# Patient Record
Sex: Male | Born: 1950 | Race: White | Hispanic: No | Marital: Married | Smoking: Current every day smoker
Health system: Southern US, Community
[De-identification: ages and names within clinical notes are randomized; demographics above are authoritative.]

## PROBLEM LIST (undated history)

## (undated) DIAGNOSIS — K219 Gastro-esophageal reflux disease without esophagitis: Secondary | ICD-10-CM

## (undated) DIAGNOSIS — I10 Essential (primary) hypertension: Secondary | ICD-10-CM

## (undated) DIAGNOSIS — R911 Solitary pulmonary nodule: Secondary | ICD-10-CM

## (undated) DIAGNOSIS — E785 Hyperlipidemia, unspecified: Secondary | ICD-10-CM

## (undated) DIAGNOSIS — J439 Emphysema, unspecified: Secondary | ICD-10-CM

## (undated) DIAGNOSIS — J449 Chronic obstructive pulmonary disease, unspecified: Secondary | ICD-10-CM

## (undated) DIAGNOSIS — I219 Acute myocardial infarction, unspecified: Secondary | ICD-10-CM

## (undated) DIAGNOSIS — I739 Peripheral vascular disease, unspecified: Secondary | ICD-10-CM

## (undated) DIAGNOSIS — Z87891 Personal history of nicotine dependence: Secondary | ICD-10-CM

## (undated) DIAGNOSIS — E669 Obesity, unspecified: Secondary | ICD-10-CM

## (undated) DIAGNOSIS — I2510 Atherosclerotic heart disease of native coronary artery without angina pectoris: Secondary | ICD-10-CM

## (undated) HISTORY — DX: Peripheral vascular disease, unspecified: I73.9

## (undated) HISTORY — PX: TONSILLECTOMY AND ADENOIDECTOMY: SUR1326

## (undated) HISTORY — DX: Essential (primary) hypertension: I10

## (undated) HISTORY — PX: LAPAROSCOPIC CHOLECYSTECTOMY: SUR755

## (undated) HISTORY — DX: Atherosclerotic heart disease of native coronary artery without angina pectoris: I25.10

## (undated) HISTORY — DX: Hyperlipidemia, unspecified: E78.5

## (undated) HISTORY — PX: HERNIA REPAIR: SHX51

## (undated) HISTORY — DX: Acute myocardial infarction, unspecified: I21.9

## (undated) HISTORY — DX: Gastro-esophageal reflux disease without esophagitis: K21.9

## (undated) HISTORY — PX: OTHER SURGICAL HISTORY: SHX169

---

## 1998-05-06 ENCOUNTER — Ambulatory Visit (HOSPITAL_COMMUNITY): Admission: RE | Admit: 1998-05-06 | Discharge: 1998-05-06 | Payer: Self-pay | Admitting: Urology

## 1999-10-18 ENCOUNTER — Ambulatory Visit (HOSPITAL_COMMUNITY): Admission: RE | Admit: 1999-10-18 | Discharge: 1999-10-18 | Payer: Self-pay | Admitting: Cardiology

## 1999-10-18 ENCOUNTER — Encounter: Payer: Self-pay | Admitting: Cardiology

## 1999-10-24 ENCOUNTER — Encounter: Admission: RE | Admit: 1999-10-24 | Discharge: 1999-10-24 | Payer: Self-pay | Admitting: Cardiology

## 1999-10-24 ENCOUNTER — Encounter: Payer: Self-pay | Admitting: Cardiology

## 1999-10-26 ENCOUNTER — Ambulatory Visit (HOSPITAL_COMMUNITY): Admission: RE | Admit: 1999-10-26 | Discharge: 1999-10-26 | Payer: Self-pay | Admitting: Cardiology

## 1999-10-26 HISTORY — PX: CARDIAC CATHETERIZATION: SHX172

## 1999-12-15 ENCOUNTER — Ambulatory Visit: Admission: RE | Admit: 1999-12-15 | Discharge: 1999-12-15 | Payer: Self-pay | Admitting: Cardiovascular Disease

## 1999-12-15 HISTORY — PX: ILIAC ARTERY STENT: SHX1786

## 2002-02-04 ENCOUNTER — Encounter: Payer: Self-pay | Admitting: Cardiology

## 2002-02-04 ENCOUNTER — Ambulatory Visit (HOSPITAL_COMMUNITY): Admission: RE | Admit: 2002-02-04 | Discharge: 2002-02-04 | Payer: Self-pay | Admitting: Pediatrics

## 2003-05-02 HISTORY — PX: CORONARY ANGIOPLASTY WITH STENT PLACEMENT: SHX49

## 2003-10-13 ENCOUNTER — Encounter: Admission: RE | Admit: 2003-10-13 | Discharge: 2003-10-13 | Payer: Self-pay | Admitting: Cardiology

## 2003-10-15 ENCOUNTER — Ambulatory Visit (HOSPITAL_COMMUNITY): Admission: RE | Admit: 2003-10-15 | Discharge: 2003-10-16 | Payer: Self-pay | Admitting: Cardiology

## 2005-02-27 ENCOUNTER — Ambulatory Visit: Payer: Self-pay | Admitting: Internal Medicine

## 2005-03-31 ENCOUNTER — Ambulatory Visit: Payer: Self-pay | Admitting: Internal Medicine

## 2005-04-07 ENCOUNTER — Ambulatory Visit: Payer: Self-pay | Admitting: Internal Medicine

## 2008-04-20 ENCOUNTER — Ambulatory Visit (HOSPITAL_COMMUNITY): Admission: RE | Admit: 2008-04-20 | Discharge: 2008-04-20 | Payer: Self-pay | Admitting: General Surgery

## 2008-04-20 ENCOUNTER — Encounter (INDEPENDENT_AMBULATORY_CARE_PROVIDER_SITE_OTHER): Payer: Self-pay | Admitting: General Surgery

## 2010-05-01 HISTORY — PX: UMBILICAL HERNIA REPAIR: SHX196

## 2010-09-13 NOTE — Op Note (Signed)
Brady Brooks, Brady Brooks             ACCOUNT NO.:  [id number]   MEDICAL RECORD NO.:  [id number]          PATIENT TYPE:  AMB   LOCATION:  DAY                          FACILITY:  [facility]   PHYSICIAN:  Anselm Pancoast. Weatherly, M.D.DATE OF BIRTH:  09/14/1950   DATE OF PROCEDURE:  04/20/2008  DATE OF DISCHARGE:                               OPERATIVE REPORT   PREOPERATIVE DIAGNOSIS:  Chronic cholecystitis.   POSTOPERATIVE DIAGNOSIS:  Chronic cholecystitis.   OPERATIONS:  Cholecystectomy with cholangiogram.   SURGEON:  Dr. Consuello Bossier.   ASSISTANT:  Nurse.   HISTORY:  Brady Brooks is a 60 year old Caucasian male who was  referred to me by his primary care physician for recurrent episodes of  epigastric pain.  The patient has had coronary artery problems and has  had stents and an MI, and he had seen Dr. Shelva Majestic who had evaluated him  with a cardiac workup with no evidence of any acute changes noted.  Dr.  Juluis Pitch is his regular physician and he does have a history of acid  reflux.  The patient had an ultrasound of the abdomen and they read his  gallbladder was thickened with a localized area of chronic cholecystitis  process versus a gallbladder tumor was questioned, and I saw him in the  office approximately 2 weeks ago.  He had, had these evaluations about 6  weeks ago at which time his liver tests were normal.  His white count  had been slightly elevated and he had, had another episode of similar  pain in the 6 week's time interval.  I recommended we go ahead and plan  on doing a laparoscopic cholecystectomy.  He is chronically on Plavix  and we discontinued that, and he has been off it now for approximately 9  days.  I talked to the patient last night and he desires to go ahead and  proceed with surgery and I added him on the OR schedule as an add on  today for the planned procedure.  He is n.p.o.  I repeated his blood  work and his white count now is 9400, hematocrit 47.5, and  his CMET is  not back yet.  I had, had the ultrasound which had been read.  I had the  physicians at [facility] review it and they said that they thought  that this was definitely kind of a thickened gallbladder.  We could see  what appeared to be a little sludge or stones in the gallbladder, but  certainly think that it is very unlikely to be a gallbladder tumor.  The  patient preoperatively was given 400 mg of Cipro as he is allergic to  penicillin.   DESCRIPTION OF PROCEDURE:  The patient was taken to the operative suite.  Induction of general anesthesia by Dr. __________.  The abdomen after  endotracheal tube was placed and checked was prepped with Betadine  solution and draped in a sterile manner.  A small incision was made  below the umbilicus.  The fascia was identified, opened with two Kochers  and then a small opening made through the peritoneum.  The pursestring  suture of 0 Vicryl was placed and Hassan cannula introduced.  The  gallbladder was just thickened, kind of fatty infiltration, and the  junction of the gallbladder cystic duct area you really could not see  any obvious transition area.  The upper 10 mL trocar was placed under  direct vision and the lateral 5 mL trocars were placed and I  anesthetized the fascia once each of these were placed.  I then grasped  the gallbladder, retracted it upward and outward and picked an area  about two-thirds of the way down from the tip of the distal part of  gallbladder and then opened the peritoneum, kind of teased it down and  could identify the junction of the gallbladder cystic duct and could  also identify the cystic artery, and I encompassed both with right  angles and then placed clips on the cystic artery and then a clip on the  junction of gallbladder cystic duct.  The cystic bile duct is a little  bit dilated and the catheter was easily inserted, held in place with a  clip and a cholangiogram obtained and there was good  prompt fill of the  extrahepatic biliary system.  The bile duct is dilated enough or the  cystic duct that if he had little stones, he could certainly pass them  and then on the final injection you can see where the pancreatic duct  visualizes slightly also.  There is nothing in the distal common bile  duct that looks suspicious for a stone, however.  The catheter was  removed and the cystic duct was triply clipped proximally and then  divided, and then the cystic artery was divided distal to the clips.  Then using the hook electrocautery, the gallbladder was freed from its  bed carefully.  Good hemostasis was obtained and then I placed the  gallbladder in the EndoCatch bag.  I switched camera to the upper 10-mm  trocar and withdrew the gallbladder.  I put an additional two figure-of-  eights in the fascia at the umbilicus and then anesthetized the fascia  at the umbilicus.  The CO2 was released.  I put a single fascia stitch  in the subxiphoid fascia of 0 Vicryl also and then closed the  subcutaneous wounds with 4-0 Vicryl.  Benzoin and Steri-Strips on the  skin.  I opened the gallbladder and this thickening that was questioned  as a tumor is some type of fatty infiltration in the wall of the  gallbladder.  I do not see any obvious stones, and I think that this is  kind of a chronic cholecystitis.  The patient tolerated the procedure  nicely.  I think he is going to desire to go home this afternoon.  Hopefully his symptoms will be resolved.  He will continue on his H2  blocker for the esophageal reflux.  He has had a previous colonoscopy  and Dr. Lina Sar is his for radius is gastroenterologist.           ______________________________  Anselm Pancoast. Zachery Dakins, M.D.     WJW/MEDQ  D:  04/20/2008  T:  04/20/2008  Job:  [id number]   cc:   Dr. Trudee Kuster. Shelva Majestic, M.D.  Fax: [phone]

## 2010-09-16 NOTE — Op Note (Signed)
[facility]  Patient:    Brady Brooks, Brady Brooks                 MRN: [id number] Proc. Date: 12/15/99 Adm. Date:  [id number] Disc. Date: [id number] Attending:  Berry, Jonathan [place] CC:         Thereasa Solo. Little, M.D.  [facility]   Operative Report  PROCEDURE:  Abdominal aortogram, bifemoral runoff, right common iliac artery PTCA and stent.  INDICATIONS:  The patient is a 60 year old married white male with a history of CAD, hypertension, hyperlipidemia, and claudication.  He was found to have an 80% proximal right common iliac artery stenosis at cath.  He presents now for angiography and intervention.  DESCRIPTION OF PROCEDURE:  The patient was brought to the second floor [facility] in the postabsorptive state.  He was premedicated with p.o. Valium.  His right groin was prepped and draped in the usual sterile fashion.  One percent Xylocaine was used for local anesthesia.  A 6-French sheath was inserted into the right femoral artery using standard Seldinger technique.  A 5-French Tennis Racquet cathetr, IMA, and end-hole catheters were used for abdominal aortography and bifemoral runoff.  Visipaque dye was used for the entirety of the case.  Retrograde aortic pressures were monitored during the case.  ANGIOGRAPHIC RESULTS:  1. Abdominal aorta;    a. Renal artery - normal.    b. Infrarenal abdominal aorta - normal. 2. Right lower extremity;    a. An 80% concentric proximal right common iliac artery stenosis.    b. Two-vessel runoff. 3. Left lower extremity;    a. A three-vessel runoff.  OVERALL IMPRESSION:  An 80% proximal concentric right common iliac artery stenosis.  DESCRIPTION OF PROCEDURE:  The patient received 2500 units of heparin intravenously.  A 7-French, 30 cm long coronary sheath was then advanced across the lesion.  A [id number] stent mounted on an 8 x 2 Power-Flex was then deployed at 12 atmospheres,  resulting in reduction of an 80% stenosis to 0% residual.  The patient tolerated the procedure well.  The right femoral arterial puncture site was hemostatically sealed using the Perclose S suture closure device.  The patient left the lab in stable condition.  He will be hydrated for several hours, after which he will be discharged home, and will be seen back in the office in 2-3 weeks. Dr. Caprice Kluver was notified of these results. DD:  12/15/99 TD:  12/17/99 Job: [id number]

## 2010-09-16 NOTE — Cardiovascular Report (Signed)
NAME:  Brady Brooks, Brady Brooks                    ACCOUNT NO.:  [id number]   MEDICAL RECORD NO.:  [id number]                   PATIENT TYPE:  OIB   LOCATION:  6523                                 FACILITY:  [facility]   PHYSICIAN:  Thereasa Solo. Little, M.D.              DATE OF BIRTH:  [birth year]   DATE OF PROCEDURE:  10/15/2003  DATE OF DISCHARGE:                              CARDIAC CATHETERIZATION   INDICATIONS FOR TEST:  This 60 year old male has history of coronary disease  having total occlusion of his circumflex documented in 2001.  He also has  peripheral vascular disease with stent in his right iliac and worsening  claudication symptoms.   After working on the roof on Sunday he developed substernal chest pain,  diaphoresis and shortness of breath.  It resolved after about 10 minutes.  He has had no reoccurrence of his problems, but presents for outpatient  cardiac catheterization for evaluation of what sounds like exertional  angina.   PROCEDURE:  The patient was prepped and draped in the usual sterile fashion  exposing both groins.  Following local anesthetic with 1% Xylocaine, the  Seldinger technique was employed and a 5 [place] introducer sheath was placed  into his left femoral artery.  Left and right coronary arteriography and  ventriculography and a distal aortogram was performed.  Following this,  intervention to his RCA was performed.   TOTAL CONTRAST:  165 mL.   DIAGNOSTIC EQUIPMENT:  5 French Judkins configuration catheters.   INTERVENTIONAL EQUIPMENT:  See below.   RESULTS:   HEMODYNAMIC MONITORING:  Central aortic pressure was 117/69.  Left  ventricular pressure was 117/7.  No aortic valve gradient noted at the time  of pullback.   VENTRICULOGRAPHY:  Ventriculography in the RAO projection using 25 mL of  contrast at 12 mL per second revealed normal left ventricular systolic  function.  Ejection fraction greater than 60%.  No mitral regurgitation.  Left  ventricular end-diastolic pressure 7.   DISTAL AORTOGRAM:  Distal aortogram shot to the level of the renals showed  no evidence of renal artery stenosis, only mild irregularities of the distal  aorta above the bifurcation.  The stent in the right iliac was noted and  there was diminished blood flow distal to the stent.  I could not definitely  say the degree of stenosis within the stent, but clearly there is reduced  flow.  The left iliac appeared normal.   CORONARY ARTERIOGRAPHY:  Calcification was noted in the proximal LAD on  fluoroscopy.   1. Left main normal.  2. LAD:  The left anterior descending crossed the apex of the heart and had     mild irregularities in the mid portion.  The first diagonal also had mild     irregularities.  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  3. Circumflex:  The circumflex had moderate irregularities in the mid     portion just before it became 100% occluded.  There were two very small     OM vessels that came off the proximal portion.  What appears to be a     relatively large third OM was totally occluded, but filled retrograde and     late via left-to-left collaterals (this is unchanged from 2001 cath).  4. Right coronary artery:  The right coronary artery is a dominant vessel     giving rise to PDA and posterior lateral branch.  The proximal portion of     the right coronary artery had an eccentric, but focal area of 90%     narrowing.  The mid and distal vessel were free of disease.   CONCLUSION:  1. 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  2. Mild irregularities in the left anterior descending.  3. High grade stenosis in the proximal right coronary artery.  4. Normal left ventricular function.  5. Apparent restenosis within the right iliac stent.   Because of the high grade stenosis in the right coronary  artery,  arrangements were made for intervention.  The 5 French sheath was exchanged  out for 6 [place] sheath.  JR-4 guide catheter with side holes was used.  A  short Luge wire was placed down the distal right coronary artery and a 2.5 x  9 Maverick balloon was used for dilatation at 8 atmospheres x 45.  Two  inflations were performed.  Following this, a 3.0 x 16 Taxus stent was  placed in such a manner that both the proximal and distal portion was well  covered.  It was deployed initially at 13 atmospheres for 53 seconds.  The  final inflation at 15 atmospheres for 55 seconds.   The 90% area of the proximal right coronary artery before intervention is  now less than 10% narrowed.  There was no evidence of any dissection or  thrombus or distal embolization.  There was TIMI-3 flow pre and post  intervention.   The patient was maintained on heparin with therapeutic ACTs with final ACT  at the end of the procedure being 233.  Integrilin infusion was also  performed and will be carried out for additional 12 hours.   The patient was given 300 mg of oral Plavix and he should be ready for  discharge in the morning.  His LDL is 70 and the only untreated risk factor  is his continued cigarette abuse which I discussed in detail with the  patient.                                               Thereasa Solo. Little, M.D.    ABL/MEDQ  D:  10/15/2003  T:  10/15/2003  Job:  [id number]   cc:   Brett Canales A. Cleta Alberts, M.D.  [address]  [place]  [place] [address]  Fax: [phone]

## 2010-09-16 NOTE — Cardiovascular Report (Signed)
[facility]  Patient:    JOJUAN, CHAMPNEY                 MRN: [id number] Proc. Date: 10/26/99 Adm. Date:  [id number] Disc. Date: [id number] Attending:  Loreli Dollar CC:         Robert Bellow, M.D.             Thereasa Solo. Little, M.D.                        Cardiac Catheterization  INDICATIONS FOR PROCEDURE:  Mr. Moehring is a 60 year old male who has had marked tiredness and fatigue.  A nuclear study showed lateral scar with a peri-infarction ischemia.  He has also complained of leg claudication.  PROCEDURES: 1. Left heart catheterization. 2. Ventriculography in the right anterior oblique projection. 3. Selective right and left coronary arteriography. 4. Distal aortogram.  DESCRIPTION OF PROCEDURE:  The patient was prepared and draped in the usual sterile fashion after obtaining informed consent.  A 6 French introducer sheath was placed into the right femoral artery, and it was passed into the ascending aorta.  There was mild irregularities noted in the iliacs and because of this wire exchange techniques were performed.  A distal aortogram was also performed.  The 6 French Judkins configuration catheters were used.  RESULTS: 1. Hemodynamic monitoring:  Central aortic pressure 132/73, left    ventricular pressure 132/12.  No significant aortic valve gradient was    noted at the time of pullback. 2. Ventriculography:  Ventriculography in the RAO projection revealed    normal left ventricular systolic function.  The ejection fraction was    greater than 55%.  End-diastolic pressure 10.  No mitral regurgitation.  CORONARY ARTERIOGRAPHY: 1. Left main:  Normal. 2. LAD.  The LAD extended down to the apex of the heart and was free of    disease as was the diagonal. 3. Circumflex:  The circumflex was 100% occluded in its midportion and    left to left collaterals and right to left collaterals were noted to    fill the marginal vessels. 4.  Right coronary artery:  There was a 30% area of narrowing which appeared    to be almost a hinge point in the mid RCA.  The distal RCA was normal.    Initially the ostium of the RCA appeared to be normal but after several    pictures were obtained, there was damping of the catheter and appeared to    be ostial spasm.  The patient was given intracoronary nitroglycerin.  Cusp    injections later shows the ostium to be normal. 5. Distal aortogram:  The distal aortogram showed the aorta to be smooth    without atherosclerosis.  There was no evidence of renal artery stenosis.    The right iliac had an 80% area of focal stenosis right below the    bifurcation.  CONCLUSIONS: 1. Normal left ventricular systolic function. 2. An 100% occlusion of the circumflex with left to left and right    to left collaterals to the circumflex. 3. An 80% stenosis in the proximal iliac on the right.  It should be pointed out that on the injections of the left coronary system, an AV malformation was noted.  It was moderate in size. DD:  10/31/99 TD:  10/31/99 Job: [id number]

## 2010-09-16 NOTE — Discharge Summary (Signed)
NAME:  Brady Brooks, Brady Brooks                    ACCOUNT NO.:  [id number]   MEDICAL RECORD NO.:  [id number]                   PATIENT TYPE:  OIB   LOCATION:  6524                                 FACILITY:  [facility]   PHYSICIAN:  Thereasa Solo. Little, M.D.              DATE OF BIRTH:  [birth year]   DATE OF ADMISSION:  10/15/2003  DATE OF DISCHARGE:  10/18/2003                                 DISCHARGE SUMMARY   DISCHARGE DIAGNOSES:  1. Coronary artery disease, status post catheterization this admission with     intervention.  2. Unstable angina pectoris, ruled out for myocardial infarction.  3. Cerebrovascular disease, status post right iliac stenting.  4. Hyperlipidemia.  5. Ongoing tobacco use.   HISTORY OF PRESENT ILLNESS:  This is a 60 year old Caucasian gentleman, a  patient of Dr. Clarene Duke, who was evaluated by Dr. Elsie Lincoln in our office on October 13, 2003, with complaints of chest pain, nitroglycerin responsive.  The  patient does have a known history of single-vessel coronary artery disease  with occluded circumflex and collaterals from right to left and left to  left.  The decision was made to proceed with the cardiac catheterization and  the patient was given precatheterization IVP dye allergy prophylaxis with  p.o. dose of prednisone 60 mg 18 hours before catheterization and 60 mg of  prednisone p.o. one to two hours prior to catheterization.   HOSPITAL COURSE:  A cardiac catheterization was performed on October 15, 2003,  by Dr. Clarene Duke and it showed a high-grade RCA lesion and TAXUS stent was  implanted with reduction of the lesion from 90% to less than 10%.   The patient tolerated the procedure well.  The next morning he was assessed  by Dr. Clarene Duke.  The catheterization site did not show any signs of bleeding  or ecchymosis and he was stable for discharge home.   HOSPITAL LABORATORIES AND PERTINENT STUDIES:  His EKG did not show any ST-T  wave changes.  His CBC showed white blood  count 11.6, platelets 222,  hemoglobin 16.2, and hematocrit 36.5.  BMP with sodium 131, potassium 4.1,  chloride 103, CO2 24, glucose 125, BUN 16, and creatinine 1.0.  Troponin was  negative at 0.02.   DISCHARGE MEDICATIONS:  1. Pepcid 20 mg daily.  2. Lipitor 40 mg daily.  3. Diovan HCT 160/12.5 mg daily.  4. Aspirin 81 mg daily.  5. Plavix 75 mg daily.  6. Nitroglycerin 0.4 mg daily.   DISCHARGE ACTIVITIES:  No driving, no lifting greater than 5 pounds for  three days.  No strenuous activity.   DISCHARGE DIET:  Low-fat, low-cholesterol diet.   SPECIAL INSTRUCTIONS:  Smoking cessation necessity was emphasized and he had  a smoking cessation consult.   FOLLOWUP:  Dr. Clarene Duke will see the patient in the office on October 27, 2003,  at 11:30 a.m.      [place], [place].  Thereasa Solo. Little, M.D.    MK/MEDQ  D:  10/16/2003  T:  10/18/2003  Job:  [id number]   cc:   [facility]

## 2010-11-30 ENCOUNTER — Encounter (INDEPENDENT_AMBULATORY_CARE_PROVIDER_SITE_OTHER): Payer: Self-pay | Admitting: General Surgery

## 2010-12-01 ENCOUNTER — Other Ambulatory Visit (INDEPENDENT_AMBULATORY_CARE_PROVIDER_SITE_OTHER): Payer: Self-pay | Admitting: General Surgery

## 2010-12-01 ENCOUNTER — Ambulatory Visit (INDEPENDENT_AMBULATORY_CARE_PROVIDER_SITE_OTHER): Payer: Commercial Managed Care - PPO | Admitting: General Surgery

## 2010-12-01 ENCOUNTER — Encounter (INDEPENDENT_AMBULATORY_CARE_PROVIDER_SITE_OTHER): Payer: Self-pay | Admitting: General Surgery

## 2010-12-01 VITALS — BP 152/78 | HR 60 | Temp 98.8°F | Ht 70.0 in | Wt 209.2 lb

## 2010-12-01 DIAGNOSIS — K921 Melena: Secondary | ICD-10-CM

## 2010-12-01 DIAGNOSIS — I2510 Atherosclerotic heart disease of native coronary artery without angina pectoris: Secondary | ICD-10-CM

## 2010-12-01 DIAGNOSIS — K429 Umbilical hernia without obstruction or gangrene: Secondary | ICD-10-CM

## 2010-12-01 DIAGNOSIS — R195 Other fecal abnormalities: Secondary | ICD-10-CM

## 2010-12-01 LAB — CBC
Hemoglobin: 17.1 g/dL — ABNORMAL HIGH (ref 13.0–17.0)
MCHC: 33.5 g/dL (ref 30.0–36.0)
RDW: 14 % (ref 11.5–15.5)
WBC: 11.6 10*3/uL — ABNORMAL HIGH (ref 4.0–10.5)

## 2010-12-01 NOTE — Progress Notes (Signed)
Subjective:     Patient ID: Brady Newcomer Sr., male   DOB: 1950-09-14, 60 y.o.   MRN: [id number] BP 152/78  Pulse 60  Temp 98.8 F (37.1 C)  Ht 5\' 10"  (1.778 m)  Wt 209 lb 3.2 oz (94.892 kg)  BMI 30.02 kg/m2  HPI The patient returns now approximately 2-1/2 years following a laparoscopic cholecystectomy and cholangiogram but I didn't Wonda Olds in December of 9. The patient says that cervical months after his surgery he noticed a little weakness at the navel and an approximately a year later it started enlarging he is followed by his chronic physicians at [facility] and hi 0.10 he has a history of angina for which she's had a stent but really did his stent placement but is followed by Dr. Dory Peru at present. He's not had any change in his hypertension or cardiac issues but has noted that his umbilical weakness is getting larger and his medical doctor recommended that he see return to see a surgeon his last encounter was in November of 11 at which time they thought his coronary artery disease was doing nicely. He had an abdominal ultrasound done to check his aorta was okay and states that his last colonoscopy was probably 5 years ago by Dr. Louis Meckel with [facility]  The patient states that if he is straining to have a bowel movement he says his bowels are regular some time to preputial hernia but that's he only time that it gives him any symptoms.  Review of Systems Past Medical History  Diagnosis Date  . Coronary artery disease   . Heart attack   . Poor circulation   . GERD (gastroesophageal reflux disease)   . Wears dentures   . Hernia      Past surgical history he has had a laparoscopic cholecystectomy and had a stent placed cervical years prior to that and said he had neck pain but has had no similar pains since he had a stent placement approximately 5 or more years ago. He is on Plavix chronically  Scheduled Meds:   .   Objective:   Physical Exam On  physical exam his vital signs were reviewed he is allergic to penicillin we'll plan on use and Cipro. Eyes ears nose and throat unremarkable lungs are clear cardiac normal sinus rhythm he still smoking approximately pack of cigarettes a day in spite of these Y. evidence not good for an and. Abdominal exam is get our generous abdomen with a definite fascial defect about the size of a quarter at the umbilicus and there may be a little weakness to the right side also to the actual fascia defect on stool rectal exam his stool is brown and it is Hemoccult positive and we discussed about the need of possibly a repeat colonoscopy with no edema her Hemoccult sample and that he'll return and he says he's also had blood within his urine on a chronic basis of that never identified any problems. There is no pedal edema CNS appears physiologic.    Patient hasn an umbilical hernia known cardiac disease with a stent on Plavix and his continues to smoke. He will do a home Hemoccult and will probably get a colonoscopy prior to proceeding with the hernia repair. Assessment:    The patient was given a home Hemoccult which he will return his uncle dying of a CBC be met today and will discuss after we get the results of his stool cards for the procedure  was a colonoscopy or not. As it thank you he's had a rare me recently to give a false positive test and he doesn't have a family history of colon cancer that he is aware of.    Plan:    After the results of the blood work and possible repeat colonoscopy is done we will plan on doing I repair of the umbilical hernia with a ventral X. patch as an outpatient at [facility] his records from the cardiologist at no point her for cardiac clearance is needed but he related a repeat E. KG since his been over 6 months. We will use Cipro and he'll be off work approximately a week and understands it should not be doing a real heavy lifting for about 25 pounds for approximately 4-6 weeks after  his surgery since the nature of his employment sometimes requires heavy lifting.

## 2010-12-01 NOTE — Patient Instructions (Addendum)
Same the blood work today and complete the stool Hemoccult cards and returned to our office. If the stool Hemoccult is positive we will definitely proceed with colonoscopy and if hematocrit is low I would also definitely colonoscopy he will need to be off Plavix for 5 days prior to the hernia repair which will plan we'll At [facility] as an outpatient

## 2010-12-02 LAB — COMPLETE METABOLIC PANEL WITH GFR
Albumin: 4.6 g/dL (ref 3.5–5.2)
BUN: 12 mg/dL (ref 6–23)
CO2: 24 mEq/L (ref 19–32)
GFR, Est African American: >60 mL/min (ref >60)
GFR, Est Non African American: >60 mL/min (ref >60)
Glucose, Bld: 98 mg/dL (ref 70–99)
Sodium: 138 mEq/L (ref 135–145)
Total Bilirubin: 0.5 mg/dL (ref 0.3–1.2)
Total Protein: 6.9 g/dL (ref 6.0–8.3)

## 2010-12-16 ENCOUNTER — Other Ambulatory Visit (INDEPENDENT_AMBULATORY_CARE_PROVIDER_SITE_OTHER): Payer: Self-pay | Admitting: General Surgery

## 2010-12-16 DIAGNOSIS — Z7901 Long term (current) use of anticoagulants: Secondary | ICD-10-CM

## 2010-12-21 ENCOUNTER — Other Ambulatory Visit (INDEPENDENT_AMBULATORY_CARE_PROVIDER_SITE_OTHER): Payer: Self-pay | Admitting: General Surgery

## 2010-12-21 ENCOUNTER — Ambulatory Visit (HOSPITAL_BASED_OUTPATIENT_CLINIC_OR_DEPARTMENT_OTHER)
Admission: RE | Admit: 2010-12-21 | Discharge: 2010-12-21 | Disposition: A | Discharge status: Home / Self Care | Payer: 59 | Source: Ambulatory Visit | Attending: General Surgery | Admitting: General Surgery

## 2010-12-21 DIAGNOSIS — I739 Peripheral vascular disease, unspecified: Secondary | ICD-10-CM | POA: Insufficient documentation

## 2010-12-21 DIAGNOSIS — K429 Umbilical hernia without obstruction or gangrene: Secondary | ICD-10-CM | POA: Insufficient documentation

## 2010-12-21 DIAGNOSIS — Z01812 Encounter for preprocedural laboratory examination: Secondary | ICD-10-CM | POA: Insufficient documentation

## 2010-12-21 DIAGNOSIS — I2510 Atherosclerotic heart disease of native coronary artery without angina pectoris: Secondary | ICD-10-CM | POA: Insufficient documentation

## 2010-12-21 DIAGNOSIS — J4489 Other specified chronic obstructive pulmonary disease: Secondary | ICD-10-CM | POA: Insufficient documentation

## 2010-12-21 DIAGNOSIS — Z79899 Other long term (current) drug therapy: Secondary | ICD-10-CM | POA: Insufficient documentation

## 2010-12-21 DIAGNOSIS — Z9089 Acquired absence of other organs: Secondary | ICD-10-CM | POA: Insufficient documentation

## 2010-12-21 DIAGNOSIS — Z7902 Long term (current) use of antithrombotics/antiplatelets: Secondary | ICD-10-CM | POA: Insufficient documentation

## 2010-12-21 DIAGNOSIS — J449 Chronic obstructive pulmonary disease, unspecified: Secondary | ICD-10-CM | POA: Insufficient documentation

## 2010-12-21 DIAGNOSIS — I10 Essential (primary) hypertension: Secondary | ICD-10-CM | POA: Insufficient documentation

## 2010-12-21 DIAGNOSIS — Z01810 Encounter for preprocedural cardiovascular examination: Secondary | ICD-10-CM | POA: Insufficient documentation

## 2010-12-21 DIAGNOSIS — I252 Old myocardial infarction: Secondary | ICD-10-CM | POA: Insufficient documentation

## 2010-12-21 LAB — POCT I-STAT, CHEM 8
Chloride: 105 mEq/L (ref 96–112)
HCT: 50 % (ref 39.0–52.0)
Hemoglobin: 17 g/dL (ref 13.0–17.0)
Potassium: 4.4 mEq/L (ref 3.5–5.1)
Sodium: 141 mEq/L (ref 135–145)

## 2010-12-22 ENCOUNTER — Telehealth (INDEPENDENT_AMBULATORY_CARE_PROVIDER_SITE_OTHER): Payer: Self-pay

## 2010-12-22 NOTE — Telephone Encounter (Signed)
This is an addendum to previous telephone encounter.  Pt's wife is concerned since pt has not passed gas since the day before surgery.  He is having some heartburn and a "full" feeling all the time.  I recommended Miralax and an OTC antacid.  She says he has tried the antacid which works for a little while.  She will try the Miralax.  They would like Dr. Zachery Dakins to know.  The wife is concerned pt may develop an ileus.

## 2010-12-22 NOTE — Telephone Encounter (Signed)
Pt's wife called.  Pt has not passed gas since before surgery (yesterday) and she is concerned.  She also reported he was having intermittent heartburn and a "fullness" feeling.  I recommended Miralax and an OTC antacid, and we would call her back with Dr. Annette Stable advice.

## 2010-12-23 NOTE — Telephone Encounter (Signed)
Call given to Dr. Zachery Dakins, advised fleets enema, otherwise patient can be checked in the office. Increase activity also.

## 2011-01-05 ENCOUNTER — Ambulatory Visit (INDEPENDENT_AMBULATORY_CARE_PROVIDER_SITE_OTHER): Payer: Commercial Managed Care - PPO | Admitting: General Surgery

## 2011-01-05 ENCOUNTER — Encounter (INDEPENDENT_AMBULATORY_CARE_PROVIDER_SITE_OTHER): Payer: Self-pay | Admitting: General Surgery

## 2011-01-05 VITALS — BP 132/84 | HR 74 | Temp 98.2°F

## 2011-01-05 DIAGNOSIS — K429 Umbilical hernia without obstruction or gangrene: Secondary | ICD-10-CM

## 2011-01-05 NOTE — Patient Instructions (Signed)
Limits or lift until approximately 30 pounds or less for the next 6 weeks. I would like to see you again in 6 weeks for final postoperative visit. Hopefully the bowel constipation will not be an issue.

## 2011-01-05 NOTE — Progress Notes (Signed)
Subjective:     Patient ID: Brady Newcomer Sr., male   DOB: Sep 06, 1950, 60 y.o.   MRN: [id number]  HPIPatient returns now approximately 2 weeks following the local hernia repair with a medium-size ventral X. patch. He originally was on Percocet and it caused him to be severely constipated and he discontinue the pain medication but the bowel and return in about 2 days later. He is having no follow problems his incision is healing nicely and his wife who is a Engineer, civil (consulting) and 5100 removed his sutures earlier and we. There is no redness or problems with infection.   Review of Systems     Objective:   Physical ExamBP 132/84  Pulse 74  Temp(Src) 98.2 F (36.8 C) (Temporal) Recent umbilical hernia repair. Slight asymmetry since the hernia was born the right side and you can barely feel the mesh is no evidence of infection with drainage or problems.    Assessment:    Return for follow up appointment in 6 weeks for final postop visit and to limit lifting to 30 times a less for the next 6 weeks     Plan:     See note above

## 2011-01-06 ENCOUNTER — Encounter (INDEPENDENT_AMBULATORY_CARE_PROVIDER_SITE_OTHER): Payer: Commercial Managed Care - PPO | Admitting: General Surgery

## 2011-01-12 NOTE — Op Note (Signed)
Brady Brooks, Brady Brooks           ACCOUNT NO.:  [id number]  MEDICAL RECORD NO.:  [id number]  LOCATION:                                 FACILITY:  PHYSICIAN:  Anselm Pancoast. Lilla Callejo, M.D.DATE OF BIRTH:  17-Dec-1950  DATE OF PROCEDURE:  12/21/2010 DATE OF DISCHARGE:                              OPERATIVE REPORT   PREOPERATIVE DIAGNOSIS:  Umbilical hernia.  OPERATION:  Repair of umbilical hernia with medium size Ventralex patch.  General anesthesia, local supplementation.  HISTORY:  Brady Brooks is a 60 year old Caucasian male who approximately 3 years ago had gallstones and I did a laparoscopic cholecystectomy.  He said about 3 months after surgery, he noticed that he was getting a little swelling at the navel.  He works at [employer] for the [employer] and he also is a cigarette smoker.  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 He has had a mild heart attack, had a stent placed.  He was actually having angina type symptoms not real heart attack I think and he is followed by his physician at [facility], Roxanne Mins is his PA, and he is followed by his cardiologist, Dr. Dory Peru, [facility] cardiologist.  The patient now has a defect about 3-4 inches in size with definite bulge.  In the office, I did an ultrasound, I thought the fascial defect was probably about a quarter size.  The patient has not had any problems with incarceration, the hernia has gotten larger over the last year, and he desires to have it repaired.  I discussed with him that we would plan on doing the Ventralex patch and whether a medium or small size would be needed would be determined at the time of surgery.  The patient was taken to the operative suite.  Induction of general anesthesia and endotracheal tube. He was given 400 mg of Cipro and then a little transverse umbilical incision was made and this was right above where it had a little small laparoscopic gallbladder incision.  The skin  was separated from the hernia sac and there was moderate amount of hernia sac kind of folded down over the fascia inferior to the right, but when we had freed the hernia sac from the fascia, he has got defect that is about 3-4 cm in width and the smaller patch really does not given enough coverage.  I freed up the peritoneum from the fascia and then sutured the 2 edges together with interrupted sutures of 2-0 Vicryl and then used the medium- size patch which is 6.5 cm.  I anchored the edge of the mesh, all the way around.  I think there were 6 or 7 sutures all total anchoring it up and the 0 Prolene was used to do this and this was done in direct vision, making sure there was no bowel and there was very little omentum up in the area was trapped.  The patch was laying flat and then I closed the actual fascia transversely with, I think about 6 sutures of the 0 Prolene, picking up a little bit of the fascia in the midportion.  The fascia had been anesthetized with Marcaine before I actually closed the last layer and then  some additional Marcaine was placed in the subcutaneous tissue.  The subcutaneous tissue was closed with 3-0 Vicryl and then I used interrupted 4-0 nylon sutures on the skin.  A sterile occlusive dressing was applied.  I did apply a little Neosporin ointment to the area and then the dressing applied.  The patient tolerated procedure nicely and he will be released after a short stay.  I had suggested to wear an abdominal binder.  He is going to be off work this week, but knows that he needs to wait about 4 weeks before going back to doing strenuous lifting which it does require.  He will resume all of his usual medications and hopefully will stop or decrease his smoke and has been aware of the consequences and has had no success in smoking cessation.  His wife is actually a Engineer, civil (consulting) at [employer].     Anselm Pancoast. Zachery Dakins, M.D.     WJW/MEDQ  D:  12/21/2010  T:   12/22/2010  Job:  [id number]  cc:   Roxanne Mins, PA [facility]  Electronically Signed by Consuello Bossier M.D. on 01/12/2011 08:17:48 AM

## 2011-02-02 LAB — COMPREHENSIVE METABOLIC PANEL
ALT: 26 U/L (ref 0–53)
Alkaline Phosphatase: 61 U/L (ref 39–117)
CO2: 24 mEq/L (ref 19–32)
Chloride: 111 mEq/L (ref 96–112)
GFR calc non Af Amer: >60 mL/min (ref >60)
Glucose, Bld: 113 mg/dL — ABNORMAL HIGH (ref 70–99)
Potassium: 4.9 mEq/L (ref 3.5–5.1)
Sodium: 140 mEq/L (ref 135–145)
Total Bilirubin: 0.8 mg/dL (ref 0.3–1.2)
Total Protein: 6.8 g/dL (ref 6.0–8.3)

## 2011-02-02 LAB — CBC
Hemoglobin: 14.6 g/dL (ref 13.0–17.0)
RBC: 4.61 MIL/uL (ref 4.22–5.81)
WBC: 9.4 10*3/uL (ref 4.0–10.5)

## 2011-02-15 ENCOUNTER — Encounter (INDEPENDENT_AMBULATORY_CARE_PROVIDER_SITE_OTHER): Payer: Commercial Managed Care - PPO | Admitting: General Surgery

## 2012-10-02 ENCOUNTER — Encounter (INDEPENDENT_AMBULATORY_CARE_PROVIDER_SITE_OTHER): Payer: Self-pay | Admitting: Surgery

## 2012-10-02 ENCOUNTER — Ambulatory Visit (INDEPENDENT_AMBULATORY_CARE_PROVIDER_SITE_OTHER): Payer: BC Managed Care – PPO | Admitting: Surgery

## 2012-10-02 VITALS — BP 180/82 | HR 72 | Temp 97.3°F | Resp 16 | Ht 70.0 in | Wt 209.4 lb

## 2012-10-02 DIAGNOSIS — L0501 Pilonidal cyst with abscess: Secondary | ICD-10-CM

## 2012-10-02 NOTE — Progress Notes (Signed)
Subjective:     Patient ID: Brady Newcomer Sr., male   DOB: [birth year], 61 y.o.   MRN: [id number]  HPI This gentleman has been seen in our office before for an umbilical hernia which has been repaired BY Dr. Zachery Dakins. He is here today For evaluation of a pilonidal infection. He has had previous resection of the pilonidal cyst approximately 35 years ago. He had to have a skin graft to that area.  He started developing an area of tenderness and erythema several days ago. He started taking some amoxicillin he had at home and he improved. He saw his primary care physician and was given a shot of IV antibiotics and was written a prescription for oral antibiotics.  Review of Systems     Objective:   Physical Exam On exam, there is an old scar the gluteal cleft with a opening at the top. There is some erythema but no purulence    Assessment:     Infected pilonidal cyst     Plan:     Hopefully this will improve with local wound care and the antibiotics. I will see him back in 3 weeks for reevaluation

## 2012-10-21 ENCOUNTER — Encounter (INDEPENDENT_AMBULATORY_CARE_PROVIDER_SITE_OTHER): Payer: BC Managed Care – PPO | Admitting: Surgery

## 2014-01-01 ENCOUNTER — Telehealth: Payer: Self-pay | Admitting: Cardiovascular Disease

## 2014-01-01 NOTE — Telephone Encounter (Signed)
Closed encouter

## 2014-01-22 ENCOUNTER — Ambulatory Visit (INDEPENDENT_AMBULATORY_CARE_PROVIDER_SITE_OTHER): Payer: BC Managed Care – PPO | Admitting: Cardiovascular Disease

## 2014-01-22 ENCOUNTER — Encounter: Payer: Self-pay | Admitting: Cardiovascular Disease

## 2014-01-22 VITALS — BP 158/88 | HR 69 | Ht 70.0 in | Wt 210.6 lb

## 2014-01-22 DIAGNOSIS — Z01818 Encounter for other preprocedural examination: Secondary | ICD-10-CM

## 2014-01-22 DIAGNOSIS — E785 Hyperlipidemia, unspecified: Secondary | ICD-10-CM | POA: Insufficient documentation

## 2014-01-22 DIAGNOSIS — R5383 Other fatigue: Secondary | ICD-10-CM

## 2014-01-22 DIAGNOSIS — I739 Peripheral vascular disease, unspecified: Secondary | ICD-10-CM | POA: Insufficient documentation

## 2014-01-22 DIAGNOSIS — R5381 Other malaise: Secondary | ICD-10-CM

## 2014-01-22 DIAGNOSIS — Z720 Tobacco use: Secondary | ICD-10-CM | POA: Insufficient documentation

## 2014-01-22 DIAGNOSIS — I2510 Atherosclerotic heart disease of native coronary artery without angina pectoris: Secondary | ICD-10-CM | POA: Insufficient documentation

## 2014-01-22 DIAGNOSIS — Z79899 Other long term (current) drug therapy: Secondary | ICD-10-CM

## 2014-01-22 DIAGNOSIS — D689 Coagulation defect, unspecified: Secondary | ICD-10-CM

## 2014-01-22 NOTE — Progress Notes (Signed)
01/22/2014 Brady Crumb Sr.   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 He has a history of CAD status post RCA stenting back in 2005 with a known chronically occluded circumflex and normal LV function. His cardiac risk factor profile is notable for tobacco abuse and treated hyperlipidemia. He denies chest pain or shortness of breath. I stented his right common iliac artery 12/15/99 with a peak for balloon-expandable stent (8 mm x 2 sinus). He had excellent angiographic and clinical results. Over the last 2-3 years she's had progressive claudication in his right hip buttock and leg. Recent workup performed by Brady Brooks revealed a right ABI of 0.43 with what appeared to be an occluded right common iliac and SFA. A CT scan confirmed iliac occlusion.   Current Outpatient Prescriptions  Medication Sig Dispense Refill  . aspirin 81 MG chewable tablet Chew 81 mg by mouth daily.      . clopidogrel (PLAVIX) 75 MG tablet Take 75 mg by mouth daily.       . simvastatin (ZOCOR) 40 MG tablet Take 40 mg by mouth daily.       No current facility-administered medications for this visit.    Allergies  Allergen Reactions  . Erythromycin     All mycin drugs  . Penicillins   . Sulfa Antibiotics     History   Social History  . Marital Status: Married    Spouse Name: N/A    Number of Children: N/A  . Years of Education: N/A   Occupational History  . Not on file.   Social History Main Topics  . Smoking status: Current Every Day Smoker -- 1.00 packs/day  . Smokeless tobacco: Never Used  . Alcohol Use: No  . Drug Use: No    . Sexual Activity: Not on file   Other Topics Concern  . Not on file   Social History Narrative  . No narrative on file     Review of Systems: General: negative for chills, fever, night sweats or weight changes.  Cardiovascular: negative for chest pain, dyspnea on exertion, edema, orthopnea, palpitations, paroxysmal nocturnal dyspnea or shortness of breath Dermatological: negative for rash Respiratory: negative for cough or wheezing Urologic: negative for hematuria Abdominal: negative for nausea, vomiting, diarrhea, bright red blood per rectum, melena, or hematemesis Neurologic: negative for visual changes, syncope, or dizziness All other systems reviewed and are otherwise negative except as noted above.    Blood pressure 158/88, pulse 69, height 5\' 10"  (1.778 m), weight 210 lb 9.6 oz (95.528 kg).  General appearance: alert and no distress Neck: no adenopathy, no carotid bruit, no JVD, supple, symmetrical, trachea midline and thyroid not enlarged, symmetric, no tenderness/mass/nodules Lungs: clear to auscultation bilaterally Heart: regular rate and rhythm, S1, S2 normal, no murmur, click, rub or gallop Extremities: extremities normal, atraumatic, no cyanosis or edema and 1+ right, 2+ left femoral pulses without bruits. Absent right pedal pulses were 2+ left pedal pulses.  EKG normal sinus rhythm at 70 without ST or T wave changes  ASSESSMENT AND PLAN:   Peripheral arterial disease Status post right common iliac artery PTA and stent  by myself 12/15/99 with a P204 expandable stent (8 mm x 2 cm) resulting in excellent angiographic and clinical result. He began having right hip buttock and leg claudication to 3 years ago. Recent lower extremity arterial Doppler studies performed by Brady Brooks revealed a right ABI of 0.43 with an occluded iliac and SFA. The iliac occlusion was confirmed by CT scanning. He was referred for further evaluation and potential  intervention.      Brady Harp MD FACP,FACC,FAHA, [facility] 01/22/2014 11:47 AM

## 2014-01-22 NOTE — Assessment & Plan Note (Signed)
Status post right common iliac artery PTA and stent by myself 12/15/99 with a P204 expandable stent (8 mm x 2 cm) resulting in excellent angiographic and clinical result. He began having right hip buttock and leg claudication to 3 years ago. Recent lower extremity arterial Doppler studies performed by Dr. Claudie Leach revealed a right ABI of 0.43 with an occluded iliac and SFA. The iliac occlusion was confirmed by CT scanning. He was referred for further evaluation and potential intervention.

## 2014-01-22 NOTE — Patient Instructions (Signed)
Dr. Gwenlyn Found has ordered a peripheral angiogram to be done at [facility].  This procedure is going to look at the bloodflow in your lower extremities.  If Dr. Gwenlyn Found is able to open up the arteries, you will have to spend one night in the hospital.  If he is not able to open the arteries, you will be able to go home that same day.    After the procedure, you will not be allowed to drive for 3 days or push, pull, or lift anything greater than 10 lbs for one week.    You will be required to have the following tests prior to the procedure:  1. Blood work-the blood work can be done no more than 7 days prior to the procedure.  It can be done at any [facility] lab.  There is one downstairs on the first floor of this building and one in the [place] ([address])  2. Chest Xray-the chest xray order has already been placed at the [place].     *REPS TBA  TO be done prior to the angiogram-lower extremity arterial doppler- During this test, ultrasound is used to evaluate arterial blood flow in the legs. Allow approximately one hour for this exam.

## 2014-01-30 ENCOUNTER — Ambulatory Visit (HOSPITAL_COMMUNITY)
Admission: RE | Admit: 2014-01-30 | Discharge: 2014-01-30 | Disposition: A | Discharge status: Home / Self Care | Payer: BC Managed Care – PPO | Source: Ambulatory Visit | Attending: Internal Medicine | Admitting: Internal Medicine

## 2014-01-30 DIAGNOSIS — I739 Peripheral vascular disease, unspecified: Secondary | ICD-10-CM | POA: Diagnosis not present

## 2014-01-30 DIAGNOSIS — Z01818 Encounter for other preprocedural examination: Secondary | ICD-10-CM

## 2014-01-30 DIAGNOSIS — Z79899 Other long term (current) drug therapy: Secondary | ICD-10-CM

## 2014-01-30 NOTE — Progress Notes (Signed)
Lower Extremity Arterial Duplex Completed. °Brianna L Mazza,RVT °

## 2014-02-13 ENCOUNTER — Telehealth: Payer: Self-pay | Admitting: Cardiovascular Disease

## 2014-02-13 ENCOUNTER — Ambulatory Visit
Admission: RE | Admit: 2014-02-13 | Discharge: 2014-02-13 | Disposition: A | Discharge status: Home / Self Care | Payer: BC Managed Care – PPO | Source: Ambulatory Visit | Attending: Cardiovascular Disease | Admitting: Cardiovascular Disease

## 2014-02-13 ENCOUNTER — Encounter (HOSPITAL_COMMUNITY): Payer: Self-pay | Admitting: Pharmacy Technician

## 2014-02-13 DIAGNOSIS — I739 Peripheral vascular disease, unspecified: Secondary | ICD-10-CM

## 2014-02-13 DIAGNOSIS — R9389 Abnormal findings on diagnostic imaging of other specified body structures: Secondary | ICD-10-CM

## 2014-02-13 DIAGNOSIS — Z79899 Other long term (current) drug therapy: Secondary | ICD-10-CM

## 2014-02-13 DIAGNOSIS — Z01818 Encounter for other preprocedural examination: Secondary | ICD-10-CM

## 2014-02-13 LAB — BASIC METABOLIC PANEL
BUN: 13 mg/dL (ref 6–23)
CHLORIDE: 104 meq/L (ref 96–112)
CO2: 26 mEq/L (ref 19–32)
Calcium: 9.8 mg/dL (ref 8.4–10.5)
Creat: 0.84 mg/dL (ref 0.50–1.35)
GLUCOSE: 106 mg/dL — AB (ref 70–99)
POTASSIUM: 4.7 meq/L (ref 3.5–5.3)
SODIUM: 142 meq/L (ref 135–145)

## 2014-02-13 LAB — PROTIME-INR
INR: 0.99 (ref <1.50)
Prothrombin Time: 13.1 seconds (ref 11.6–15.2)

## 2014-02-13 LAB — TSH: TSH: 1.118 u[IU]/mL (ref 0.350–4.500)

## 2014-02-13 LAB — CBC
HEMATOCRIT: 47.7 % (ref 39.0–52.0)
Hemoglobin: 16.8 g/dL (ref 13.0–17.0)
MCH: 34.1 pg — AB (ref 26.0–34.0)
MCHC: 35.2 g/dL (ref 30.0–36.0)
MCV: 97 fL (ref 78.0–100.0)
PLATELETS: 249 10*3/uL (ref 150–400)
RBC: 4.92 MIL/uL (ref 4.22–5.81)
RDW: 13.2 % (ref 11.5–15.5)
WBC: 12.3 10*3/uL — AB (ref 4.0–10.5)

## 2014-02-13 LAB — APTT: aPTT: 35 seconds (ref 24–37)

## 2014-02-13 NOTE — Telephone Encounter (Signed)
The nodule is small and hopefully a false alarm but a noncontrast CT of the chest is a reasonable measure. Please order.

## 2014-02-13 NOTE — Telephone Encounter (Signed)
I spoke with wife and gave results (she is on the designated party release form).  I will proceed with ordering the CT.

## 2014-02-13 NOTE — Telephone Encounter (Signed)
Brady Brooks was calling to give some results from a preop chest x-ray that was done today. Please call  Thanks

## 2014-02-13 NOTE — Telephone Encounter (Signed)
Abnormal CXR results called >> report in EPIC >> ordered pre-procedure for PV angiogram scheduled for 10/22  Message routed to Dr. Gwenlyn Found (covering MD Dr. Sallyanne Kuster) and Curt Bears, RN to review and advise as necessary

## 2014-02-13 NOTE — Telephone Encounter (Signed)
Order placed for CT of chest without contrast

## 2014-02-18 NOTE — Telephone Encounter (Signed)
I spoke with patient's wife.  Patient has cancelled lower extremity angiogram and the CT scan.  He will reschedule after he speaks with Dr Gwenlyn Found at his office visit 11/11

## 2014-02-19 ENCOUNTER — Encounter (HOSPITAL_COMMUNITY): Admission: RE | Payer: Self-pay | Source: Ambulatory Visit

## 2014-02-19 ENCOUNTER — Ambulatory Visit (HOSPITAL_COMMUNITY)
Admission: RE | Admit: 2014-02-19 | Payer: BC Managed Care – PPO | Source: Ambulatory Visit | Admitting: Cardiovascular Disease

## 2014-02-19 ENCOUNTER — Ambulatory Visit (HOSPITAL_COMMUNITY): Payer: BC Managed Care – PPO

## 2014-02-19 SURGERY — ANGIOGRAM, LOWER EXTREMITY
Anesthesia: LOCAL

## 2014-03-06 ENCOUNTER — Ambulatory Visit (HOSPITAL_COMMUNITY)
Admission: RE | Admit: 2014-03-06 | Discharge: 2014-03-06 | Disposition: A | Discharge status: Home / Self Care | Payer: BC Managed Care – PPO | Source: Ambulatory Visit | Attending: Cardiovascular Disease | Admitting: Cardiovascular Disease

## 2014-03-06 DIAGNOSIS — I2510 Atherosclerotic heart disease of native coronary artery without angina pectoris: Secondary | ICD-10-CM | POA: Insufficient documentation

## 2014-03-06 DIAGNOSIS — F17210 Nicotine dependence, cigarettes, uncomplicated: Secondary | ICD-10-CM | POA: Diagnosis not present

## 2014-03-06 DIAGNOSIS — Z01810 Encounter for preprocedural cardiovascular examination: Secondary | ICD-10-CM | POA: Insufficient documentation

## 2014-03-06 DIAGNOSIS — R938 Abnormal findings on diagnostic imaging of other specified body structures: Secondary | ICD-10-CM | POA: Insufficient documentation

## 2014-03-06 DIAGNOSIS — Z01818 Encounter for other preprocedural examination: Secondary | ICD-10-CM | POA: Diagnosis present

## 2014-03-06 DIAGNOSIS — R9389 Abnormal findings on diagnostic imaging of other specified body structures: Secondary | ICD-10-CM

## 2014-03-06 DIAGNOSIS — J449 Chronic obstructive pulmonary disease, unspecified: Secondary | ICD-10-CM | POA: Diagnosis not present

## 2014-03-06 DIAGNOSIS — I700 Atherosclerosis of aorta: Secondary | ICD-10-CM | POA: Diagnosis not present

## 2014-03-11 ENCOUNTER — Encounter: Payer: Self-pay | Admitting: Cardiovascular Disease

## 2014-03-11 ENCOUNTER — Ambulatory Visit (INDEPENDENT_AMBULATORY_CARE_PROVIDER_SITE_OTHER): Payer: BC Managed Care – PPO | Admitting: Cardiovascular Disease

## 2014-03-11 VITALS — BP 130/82 | HR 96 | Ht 70.0 in | Wt 209.9 lb

## 2014-03-11 DIAGNOSIS — I739 Peripheral vascular disease, unspecified: Secondary | ICD-10-CM

## 2014-03-11 DIAGNOSIS — Z79899 Other long term (current) drug therapy: Secondary | ICD-10-CM

## 2014-03-11 DIAGNOSIS — D689 Coagulation defect, unspecified: Secondary | ICD-10-CM

## 2014-03-11 LAB — BASIC METABOLIC PANEL
BUN: 12 mg/dL (ref 6–23)
CALCIUM: 9.5 mg/dL (ref 8.4–10.5)
CO2: 22 mEq/L (ref 19–32)
CREATININE: 0.82 mg/dL (ref 0.50–1.35)
Chloride: 105 mEq/L (ref 96–112)
Glucose, Bld: 120 mg/dL — ABNORMAL HIGH (ref 70–99)
Potassium: 4.6 mEq/L (ref 3.5–5.3)
SODIUM: 140 meq/L (ref 135–145)

## 2014-03-11 LAB — CBC
HEMATOCRIT: 50 % (ref 39.0–52.0)
Hemoglobin: 17.5 g/dL — ABNORMAL HIGH (ref 13.0–17.0)
MCH: 34.3 pg — ABNORMAL HIGH (ref 26.0–34.0)
MCHC: 35 g/dL (ref 30.0–36.0)
MCV: 98 fL (ref 78.0–100.0)
Platelets: 253 10*3/uL (ref 150–400)
RBC: 5.1 MIL/uL (ref 4.22–5.81)
RDW: 13.2 % (ref 11.5–15.5)
WBC: 10.4 10*3/uL (ref 4.0–10.5)

## 2014-03-11 LAB — APTT: aPTT: 39 seconds — ABNORMAL HIGH (ref 24–37)

## 2014-03-11 LAB — PROTIME-INR
INR: 1.01 (ref <1.50)
PROTHROMBIN TIME: 13.3 s (ref 11.6–15.2)

## 2014-03-11 NOTE — Patient Instructions (Signed)
Dr. Gwenlyn Found has ordered a Peripheral Angiogram to be done at [facility].  This procedure is going to look at the bloodflow in your lower extremities.  If Dr. Gwenlyn Found is able to open up the arteries, you will have to spend one night in the hospital.  If he is not able to open the arteries, you will be able to go home that same day.    After the procedure, you will not be allowed to drive for 3 days or push, pull, or lift anything greater than 10 lbs for one week.    You will be required to have the following tests prior to the procedure:  1. Blood work-the blood work can be done no more than 7 days prior to the procedure.  It can be done at any [facility] lab.  There is one downstairs on the first floor of this building and one in the [place] ([address])    *REPS Child psychotherapist LEFT Groin

## 2014-03-11 NOTE — Assessment & Plan Note (Signed)
A screening chest x-ray showed nodules and a follow-up CT scan showed a 5 x 6 mm right upper lobe nodule nodule suspicious for malignancy in a patient with smoking history. The patient will follow-up with a pulmonologist for further evaluation.

## 2014-03-11 NOTE — Assessment & Plan Note (Signed)
The patient presents back today for review and evaluation of his Doppler studies which were performed 01/30/14. His right ABI was 0.31 with an occluded right common iliac artery stent as well as right SFA. His left ABIs 25 with a moderately high-frequency signal in his distal left SFA. He is symptomatic on the right with Rutherford class IV claudication. We talked about the various options and decide to proceed with angiography and potential percutaneous intervention.

## 2014-03-11 NOTE — Progress Notes (Signed)
03/11/2014 Brady Crumb Sr.   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 Durwin Nora Little's and now sees Dr. Claudie Leach. He has a history of CAD status post RCA stenting back in 2005 with a known chronically occluded circumflex and normal LV function. His cardiac risk factor profile is notable for tobacco abuse and treated hyperlipidemia. He denies chest pain or shortness of breath. I stented his right common iliac artery 12/15/99 with a peak for balloon-expandable stent (8 mm x 2 sinus). He had excellent angiographic and clinical results. Over the last 2-3 years she's had progressive claudication in his right hip buttock and leg. Recent workup performed by Dr. Claudie Leach revealed a right ABI of 0.43 with what appeared to be an occluded right common iliac and SFA. A CT scan confirmed iliac occlusion. Since I saw him in the office 01/22/14 he had arterial Doppler studies performed 01/30/14 revealing a right ABI of 0.31 with an occluded right common iliac and SFA. His left ABI was 25 with a high fever to signal in his distal left SFA. He is symptomatic on the right with Rutherford class IV claudication. He also had a nodule on his preoperative chest x-ray which was confirmed to be a 5 x 6 mm millimeter right upper lobe nodule by CT scanning suspicious for malignancy and patient with a history of tobacco abuse. The patient will seek further evaluation from a pulmonologist.   Current Outpatient Prescriptions  Medication Sig Dispense Refill  . clopidogrel (PLAVIX) 75 MG tablet Take 75 mg by mouth daily.     . simvastatin (ZOCOR) 40 MG tablet Take 40 mg by mouth daily.     No current  facility-administered medications for this visit.    Allergies  Allergen Reactions  . Aspirin     Sneezes  . Erythromycin     All mycin drugs  . Penicillins   . Sulfa Antibiotics     History   Social History  . Marital Status: Married    Spouse Name: N/A    Number of Children: N/A  . Years of Education: N/A   Occupational History  . Not on file.   Social History Main Topics  . Smoking status: Current Every Day Smoker -- 1.00 packs/day  . Smokeless tobacco: Never Used  . Alcohol Use: No  . Drug Use: No  . Sexual Activity: Not on file   Other Topics Concern  . Not on file   Social History Narrative     Review of Systems: General: negative for chills, fever, night sweats or weight changes.  Cardiovascular: negative for chest pain, dyspnea on exertion, edema, orthopnea, palpitations, paroxysmal nocturnal dyspnea or shortness of breath Dermatological: negative for rash Respiratory: negative for cough or wheezing Urologic: negative for hematuria Abdominal: negative for nausea, vomiting, diarrhea, bright red blood per rectum, melena, or hematemesis Neurologic: negative for visual changes, syncope, or dizziness All other systems reviewed and are otherwise negative except as noted above.    Blood pressure 130/82, pulse 96, height 5\' 10"  (1.778 m), weight 209 lb 14.4 oz (95.21 kg).  General appearance: alert and no distress Neck: no adenopathy, no carotid bruit, no JVD, supple, symmetrical, trachea  midline and thyroid not enlarged, symmetric, no tenderness/mass/nodules Lungs: clear to auscultation bilaterally Heart: regular rate and rhythm, S1, S2 normal, no murmur, click, rub or gallop Extremities: extremities normal, atraumatic, no cyanosis or edema  EKG not performed today  ASSESSMENT AND PLAN:   Peripheral arterial disease The patient presents back today for review and evaluation of his Doppler studies which were performed 01/30/14. His right ABI was 0.31 with an  occluded right common iliac artery stent as well as right SFA. His left ABIs 25 with a moderately high-frequency signal in his distal left SFA. He is symptomatic on the right with Rutherford class IV claudication. We talked about the various options and decide to proceed with angiography and potential percutaneous intervention.  Tobacco abuse A screening chest x-ray showed nodules and a follow-up CT scan showed a 5 x 6 mm right upper lobe nodule nodule suspicious for malignancy in a patient with smoking history. The patient will follow-up with a pulmonologist for further evaluation.  Coronary artery disease History of CAD status post RCA stenting back in 2005 by Dr. Rex Kras. He has a known chronically occluded circumflex with normal LV function. He is asymptomatic followed by Dr. Claudie Leach.      [name] MD FACP,FACC,FAHA, [facility] 03/11/2014 11:18 AM

## 2014-03-11 NOTE — Assessment & Plan Note (Signed)
History of CAD status post RCA stenting back in 2005 by Dr. Rex Kras. He has a known chronically occluded circumflex with normal LV function. He is asymptomatic followed by Dr. Claudie Leach.

## 2014-03-16 ENCOUNTER — Encounter (HOSPITAL_COMMUNITY)
Admission: RE | Disposition: A | Discharge status: Home / Self Care | Payer: Self-pay | Source: Ambulatory Visit | Attending: Cardiovascular Disease

## 2014-03-16 ENCOUNTER — Ambulatory Visit (HOSPITAL_COMMUNITY)
Admission: RE | Admit: 2014-03-16 | Discharge: 2014-03-17 | Disposition: A | Discharge status: Home / Self Care | Payer: BC Managed Care – PPO | Source: Ambulatory Visit | Attending: Cardiovascular Disease | Admitting: Cardiovascular Disease

## 2014-03-16 ENCOUNTER — Encounter (HOSPITAL_COMMUNITY): Payer: Self-pay | Admitting: General Practice

## 2014-03-16 DIAGNOSIS — Z23 Encounter for immunization: Secondary | ICD-10-CM | POA: Diagnosis not present

## 2014-03-16 DIAGNOSIS — T82858A Stenosis of vascular prosthetic devices, implants and grafts, initial encounter: Secondary | ICD-10-CM | POA: Insufficient documentation

## 2014-03-16 DIAGNOSIS — D72829 Elevated white blood cell count, unspecified: Secondary | ICD-10-CM | POA: Diagnosis not present

## 2014-03-16 DIAGNOSIS — E663 Overweight: Secondary | ICD-10-CM | POA: Diagnosis not present

## 2014-03-16 DIAGNOSIS — I2510 Atherosclerotic heart disease of native coronary artery without angina pectoris: Secondary | ICD-10-CM | POA: Diagnosis not present

## 2014-03-16 DIAGNOSIS — Z6830 Body mass index (BMI) 30.0-30.9, adult: Secondary | ICD-10-CM | POA: Diagnosis not present

## 2014-03-16 DIAGNOSIS — Z87891 Personal history of nicotine dependence: Secondary | ICD-10-CM | POA: Diagnosis not present

## 2014-03-16 DIAGNOSIS — E785 Hyperlipidemia, unspecified: Secondary | ICD-10-CM | POA: Diagnosis not present

## 2014-03-16 DIAGNOSIS — D689 Coagulation defect, unspecified: Secondary | ICD-10-CM

## 2014-03-16 DIAGNOSIS — K219 Gastro-esophageal reflux disease without esophagitis: Secondary | ICD-10-CM | POA: Insufficient documentation

## 2014-03-16 DIAGNOSIS — I70211 Atherosclerosis of native arteries of extremities with intermittent claudication, right leg: Secondary | ICD-10-CM | POA: Diagnosis not present

## 2014-03-16 DIAGNOSIS — J449 Chronic obstructive pulmonary disease, unspecified: Secondary | ICD-10-CM | POA: Insufficient documentation

## 2014-03-16 DIAGNOSIS — I708 Atherosclerosis of other arteries: Secondary | ICD-10-CM | POA: Diagnosis not present

## 2014-03-16 DIAGNOSIS — Z955 Presence of coronary angioplasty implant and graft: Secondary | ICD-10-CM | POA: Diagnosis not present

## 2014-03-16 DIAGNOSIS — Y838 Other surgical procedures as the cause of abnormal reaction of the patient, or of later complication, without mention of misadventure at the time of the procedure: Secondary | ICD-10-CM | POA: Diagnosis not present

## 2014-03-16 DIAGNOSIS — I739 Peripheral vascular disease, unspecified: Secondary | ICD-10-CM | POA: Diagnosis present

## 2014-03-16 DIAGNOSIS — I2582 Chronic total occlusion of coronary artery: Secondary | ICD-10-CM | POA: Diagnosis not present

## 2014-03-16 DIAGNOSIS — R5381 Other malaise: Secondary | ICD-10-CM

## 2014-03-16 DIAGNOSIS — I7092 Chronic total occlusion of artery of the extremities: Secondary | ICD-10-CM | POA: Insufficient documentation

## 2014-03-16 DIAGNOSIS — R5383 Other fatigue: Secondary | ICD-10-CM

## 2014-03-16 DIAGNOSIS — Z01818 Encounter for other preprocedural examination: Secondary | ICD-10-CM

## 2014-03-16 DIAGNOSIS — Z79899 Other long term (current) drug therapy: Secondary | ICD-10-CM

## 2014-03-16 DIAGNOSIS — I10 Essential (primary) hypertension: Secondary | ICD-10-CM | POA: Insufficient documentation

## 2014-03-16 DIAGNOSIS — I70213 Atherosclerosis of native arteries of extremities with intermittent claudication, bilateral legs: Secondary | ICD-10-CM

## 2014-03-16 HISTORY — DX: Chronic obstructive pulmonary disease, unspecified: J44.9

## 2014-03-16 HISTORY — PX: ILIAC VEIN ANGIOPLASTY / STENTING: SHX1788

## 2014-03-16 HISTORY — DX: Obesity, unspecified: E66.9

## 2014-03-16 HISTORY — PX: LOWER EXTREMITY ANGIOGRAM: SHX5508

## 2014-03-16 HISTORY — DX: Solitary pulmonary nodule: R91.1

## 2014-03-16 HISTORY — DX: Personal history of nicotine dependence: Z87.891

## 2014-03-16 LAB — POCT ACTIVATED CLOTTING TIME
Activated Clotting Time: 219 seconds
Activated Clotting Time: 230 seconds
Activated Clotting Time: 140 seconds

## 2014-03-16 SURGERY — ANGIOGRAM, LOWER EXTREMITY
Anesthesia: LOCAL

## 2014-03-16 MED ORDER — PNEUMOCOCCAL VAC POLYVALENT 25 MCG/0.5ML IJ INJ
0.5000 mL | INJECTION | Freq: Once | INTRAMUSCULAR | Status: AC
  Administered 2014-03-16: 0.5 mL via INTRAMUSCULAR
  Filled 2014-03-16: qty 0.5

## 2014-03-16 MED ORDER — METHYLPREDNISOLONE SODIUM SUCC 125 MG IJ SOLR
INTRAMUSCULAR | Status: AC
  Filled 2014-03-16: qty 2

## 2014-03-16 MED ORDER — ASPIRIN 81 MG PO CHEW: 81.0000 mg | CHEWABLE_TABLET | ORAL | Status: DC

## 2014-03-16 MED ORDER — ONDANSETRON HCL 4 MG/2ML IJ SOLN: 4.0000 mg | Freq: Four times a day (QID) | INTRAMUSCULAR | Status: DC | PRN

## 2014-03-16 MED ORDER — SIMVASTATIN 40 MG PO TABS
40.0000 mg | ORAL_TABLET | Freq: Every day | ORAL | Status: DC
Start: 2014-03-16 — End: 2014-03-17
  Administered 2014-03-16 – 2014-03-17 (×2): 40 mg via ORAL
  Filled 2014-03-16 (×2): qty 1

## 2014-03-16 MED ORDER — FAMOTIDINE IN NACL 20-0.9 MG/50ML-% IV SOLN
INTRAVENOUS | Status: AC
  Filled 2014-03-16: qty 50

## 2014-03-16 MED ORDER — HEPARIN SODIUM (PORCINE) 1000 UNIT/ML IJ SOLN
INTRAMUSCULAR | Status: AC
  Filled 2014-03-16: qty 1

## 2014-03-16 MED ORDER — METHYLPREDNISOLONE SODIUM SUCC 125 MG IJ SOLR
125.0000 mg | INTRAMUSCULAR | Status: AC
  Administered 2014-03-16: 125 mg via INTRAVENOUS

## 2014-03-16 MED ORDER — MORPHINE SULFATE 2 MG/ML IJ SOLN: 2.0000 mg | INTRAMUSCULAR | Status: DC | PRN

## 2014-03-16 MED ORDER — SODIUM CHLORIDE 0.9 % IJ SOLN: 3.0000 mL | INTRAMUSCULAR | Status: DC | PRN

## 2014-03-16 MED ORDER — CLOPIDOGREL BISULFATE 75 MG PO TABS: 75.0000 mg | ORAL_TABLET | Freq: Every day | ORAL | Status: DC

## 2014-03-16 MED ORDER — HEPARIN (PORCINE) IN NACL 2-0.9 UNIT/ML-% IJ SOLN
INTRAMUSCULAR | Status: AC
  Filled 2014-03-16: qty 1000

## 2014-03-16 MED ORDER — CLOPIDOGREL BISULFATE 75 MG PO TABS
75.0000 mg | ORAL_TABLET | Freq: Every day | ORAL | Status: DC
  Administered 2014-03-16: 22:00:00 75 mg via ORAL
  Filled 2014-03-16: qty 1

## 2014-03-16 MED ORDER — HYDRALAZINE HCL 20 MG/ML IJ SOLN: 10.0000 mg | INTRAMUSCULAR | Status: DC | PRN

## 2014-03-16 MED ORDER — LIDOCAINE HCL (PF) 1 % IJ SOLN
INTRAMUSCULAR | Status: AC
  Filled 2014-03-16: qty 60

## 2014-03-16 MED ORDER — SODIUM CHLORIDE 0.9 % IV SOLN: INTRAVENOUS | Status: AC

## 2014-03-16 MED ORDER — NITROGLYCERIN 1 MG/10 ML FOR IR/CATH LAB
INTRA_ARTERIAL | Status: AC
  Filled 2014-03-16: qty 10

## 2014-03-16 MED ORDER — ACETAMINOPHEN 325 MG PO TABS: 650.0000 mg | ORAL_TABLET | ORAL | Status: DC | PRN

## 2014-03-16 MED ORDER — FAMOTIDINE IN NACL 20-0.9 MG/50ML-% IV SOLN
20.0000 mg | INTRAVENOUS | Status: AC
  Administered 2014-03-16: 20 mg via INTRAVENOUS

## 2014-03-16 MED ORDER — SODIUM CHLORIDE 0.9 % IV SOLN
INTRAVENOUS | Status: DC
  Administered 2014-03-16: 06:00:00 via INTRAVENOUS

## 2014-03-16 MED ORDER — FENTANYL CITRATE 0.05 MG/ML IJ SOLN
INTRAMUSCULAR | Status: AC
  Filled 2014-03-16: qty 2

## 2014-03-16 MED ORDER — ASPIRIN EC 325 MG PO TBEC: 325.0000 mg | DELAYED_RELEASE_TABLET | Freq: Every day | ORAL | Status: DC

## 2014-03-16 MED ORDER — DIPHENHYDRAMINE HCL 50 MG/ML IJ SOLN
INTRAMUSCULAR | Status: AC
  Filled 2014-03-16: qty 1

## 2014-03-16 MED ORDER — DIPHENHYDRAMINE HCL 50 MG/ML IJ SOLN
25.0000 mg | INTRAMUSCULAR | Status: AC
  Administered 2014-03-16: 25 mg via INTRAVENOUS

## 2014-03-16 MED ORDER — ASPIRIN EC 81 MG PO TBEC
81.0000 mg | DELAYED_RELEASE_TABLET | Freq: Every day | ORAL | Status: DC
  Administered 2014-03-16 – 2014-03-17 (×2): 81 mg via ORAL
  Filled 2014-03-16 (×2): qty 1

## 2014-03-16 NOTE — Progress Notes (Signed)
Site area: right groin  Site Prior to Removal:  Level 0  Pressure Applied For 20 MINUTES    Minutes Beginning at 1125  Manual:   Yes.    Patient Status During Pull:  stable  Post Pull Groin Site:  Level 0  Post Pull Instructions Given:  Yes.    Post Pull Pulses Present:  Yes.    Dressing Applied:  Yes.    Comments:  Rechecked site frequently with no change noted  Site area: left groin  Site Prior to Removal:  Level 0  Pressure Applied For 20 MINUTES    Minutes Beginning at 1150  Manual:   Yes.    Patient Status During Pull:  stable  Post Pull Groin Site:  Level 0  Post Pull Instructions Given:  Yes.    Post Pull Pulses Present:  Yes.    Dressing Applied:  Yes.    Comments:  Checked site frequently with no change noted

## 2014-03-16 NOTE — H&P (View-Only) (Signed)
03/11/2014 [name]   1950/10/06  [id number]  Primary Physician Glendon Axe, MD Primary Cardiologist: [name] MD Renae Gloss   HPI:  Mr. 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 Durwin Nora Little's and now sees Dr. Claudie Leach. He has a history of CAD status post RCA stenting back in 2005 with a known chronically occluded circumflex and normal LV function. His cardiac risk factor profile is notable for tobacco abuse and treated hyperlipidemia. He denies chest pain or shortness of breath. I stented his right common iliac artery 12/15/99 with a peak for balloon-expandable stent (8 mm x 2 sinus). He had excellent angiographic and clinical results. Over the last 2-3 years she's had progressive claudication in his right hip buttock and leg. Recent workup performed by Dr. Claudie Leach revealed a right ABI of 0.43 with what appeared to be an occluded right common iliac and SFA. A CT scan confirmed iliac occlusion. Since I saw him in the office 01/22/14 he had arterial Doppler studies performed 01/30/14 revealing a right ABI of 0.31 with an occluded right common iliac and SFA. His left ABI was 25 with a high fever to signal in his distal left SFA. He is symptomatic on the right with Rutherford class IV claudication. He also had a nodule on his preoperative chest x-ray which was confirmed to be a 5 x 6 mm millimeter right upper lobe nodule by CT scanning suspicious for malignancy and patient with a history of tobacco abuse. The patient will seek further evaluation from a pulmonologist.   Current Outpatient Prescriptions  Medication Sig Dispense Refill  . clopidogrel (PLAVIX) 75 MG tablet Take 75 mg by mouth daily.     . simvastatin (ZOCOR) 40 MG tablet Take 40 mg by mouth daily.     No current  facility-administered medications for this visit.    Allergies  Allergen Reactions  . Aspirin     Sneezes  . Erythromycin     All mycin drugs  . Penicillins   . Sulfa Antibiotics     History   Social History  . Marital Status: Married    Spouse Name: N/A    Number of Children: N/A  . Years of Education: N/A   Occupational History  . Not on file.   Social History Main Topics  . Smoking status: Current Every Day Smoker -- 1.00 packs/day  . Smokeless tobacco: Never Used  . Alcohol Use: No  . Drug Use: No  . Sexual Activity: Not on file   Other Topics Concern  . Not on file   Social History Narrative     Review of Systems: General: negative for chills, fever, night sweats or weight changes.  Cardiovascular: negative for chest pain, dyspnea on exertion, edema, orthopnea, palpitations, paroxysmal nocturnal dyspnea or shortness of breath Dermatological: negative for rash Respiratory: negative for cough or wheezing Urologic: negative for hematuria Abdominal: negative for nausea, vomiting, diarrhea, bright red blood per rectum, melena, or hematemesis Neurologic: negative for visual changes, syncope, or dizziness All other systems reviewed and are otherwise negative except as noted above.    Blood pressure 130/82, pulse 96, height 5\' 10"  (1.778 m), weight 209 lb 14.4 oz (95.21 kg).  General appearance: alert and no distress Neck: no adenopathy, no carotid bruit, no JVD, supple, symmetrical, trachea  midline and thyroid not enlarged, symmetric, no tenderness/mass/nodules Lungs: clear to auscultation bilaterally Heart: regular rate and rhythm, S1, S2 normal, no murmur, click, rub or gallop Extremities: extremities normal, atraumatic, no cyanosis or edema  EKG not performed today  ASSESSMENT AND PLAN:   Peripheral arterial disease The patient presents back today for review and evaluation of his Doppler studies which were performed 01/30/14. His right ABI was 0.31 with an  occluded right common iliac artery stent as well as right SFA. His left ABIs 25 with a moderately high-frequency signal in his distal left SFA. He is symptomatic on the right with Rutherford class IV claudication. We talked about the various options and decide to proceed with angiography and potential percutaneous intervention.  Tobacco abuse A screening chest x-ray showed nodules and a follow-up CT scan showed a 5 x 6 mm right upper lobe nodule nodule suspicious for malignancy in a patient with smoking history. The patient will follow-up with a pulmonologist for further evaluation.  Coronary artery disease History of CAD status post RCA stenting back in 2005 by Dr. Rex Kras. He has a known chronically occluded circumflex with normal LV function. He is asymptomatic followed by Dr. Claudie Leach.      [name] MD FACP,FACC,FAHA, [facility] 03/11/2014 11:18 AM

## 2014-03-16 NOTE — CV Procedure (Signed)
Brady TOUSLEY Sr. is a 63 y.o. male    [id number] LOCATION:  FACILITY: [place]  PHYSICIAN: [name], M.D. 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 Durwin Nora Little's and now sees Dr. Claudie Leach. He has a history of CAD status post RCA stenting back in 2005 with a known chronically occluded circumflex and normal LV function. His cardiac risk factor profile is notable for tobacco abuse and treated hyperlipidemia. He denies chest pain or shortness of breath. I stented his right common iliac artery 12/15/99 with a balloon-expandable stent (8 mm x 2 cm). He had excellent angiographic and clinical results. Over the last 2-3 years she's had progressive claudication in his right hip buttock and leg. Recent workup performed by Dr. Claudie Leach revealed a right ABI of 0.43 with what appeared to be an occluded right common iliac and SFA. A CT scan confirmed iliac occlusion. Since I saw him in the office 01/22/14 he had arterial Doppler studies performed 01/30/14 revealing a right ABI of 0.31 with an occluded right common iliac and SFA. His left ABI was 25 with a high frequency  signal in his distal left SFA. He is symptomatic on the right with Rutherford class IV claudication. He also had a nodule on his preoperative chest x-ray which was confirmed to be a 5 x 6 mm millimeter right upper lobe nodule by CT scanning suspicious for malignancy and patient with a history of tobacco abuse. The patient will seek further evaluation from a pulmonologist. He presents now for angiography and potential intervention of his occluded right common iliac artery.   PROCEDURE DESCRIPTION:   The patient was  brought to the second floor  Cardiac cath lab in the postabsorptive state. He was  premedicated with Valium 5 mg by mouth and fentanyl IV. His leftwas prepped and shaved in usual sterile fashion. Xylocaine 1% was used for local anesthesia. A 5 French sheath was inserted into the left common femoral artery using standard Seldinger technique. A 5 French pigtail catheter was placed at the level of the renal arteries. Abdominal aortogram, bilateral iliac and gram and with bifemoral runoff was performed using bolus chase digital subtraction step up table technique. Omnipaque dye was used for the entirety of the case. Retrograde aortic pressure was monitored during the case.   HEMODYNAMICS:    AO SYSTOLIC/AO DIASTOLIC: 443/15   Angiographic Data:   1: Abdominal aortogram-the renal arteries widely patent. The infrarenal doll aorta was free of significant atherosclerotic disease.  2: Left lower extremity-there was a 60% proximal left common iliac artery stenosis with a 20 mm pullback gradient noted after administration of 200 g of intra-arterial nitroglycerin. There was an 80% ulcerated plaque in the mid to distal left SFA with 3 vessel runoff  3: The right common iliac artery stent was occluded. The iliac artery reconstituted by lumbar and hypogastric collaterals just proximal to the takeoff of the hypogastric artery. There was a moderately long segment occlusion of the mid right SFA with 3 vessel runoff.    IMPRESSION:Brady Brooks has an occluded proximal right common iliac artery stent. We'll proceed with attempt at percutaneous revascularization using a ICast covered stent.  Procedure Description:the  patient received a total of 7000 units of heparin intravenously with an ACT documented at 230. Total contrast administered during the case was 301 mL. A 7 French bright tip sheath was then inserted into the right common femoral artery. Attempts were made to cross the chronic total occlusion with  a Viance catheter and an Astata 20 CTO wire unsuccessfully. I then switched to a angled Navicross and a stiff angled Glidewire. I was able to cross the chronic total occlusion exchanged for an 0.35 Versicore  Wire. I dilated the occluded segment with a 5 mm x 4 cm balloon and stented the common iliac with a 7 mm x 38 mm long ICast covered stent. I then postdilated the entire stented segment with an 8 mm x 2 cm balloon at nominal pressures resulting in the reduction of a total occlusion to 0% residual. Completion abdominal and iliac angiography was then performed with the pigtail catheter.  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 The patient has residual right SFA chronic total occlusion which may need to be percutaneously addressed in a staged fashion. He denies symptoms on the left at this time although he does have a 20 mm gradient across the 60% proximal left common iliac artery stenosis as well as 80% ulcerated plaque in the distal left SFA. He'll be treated with dual antibiotic therapy, but sheaths will be removed once the ACT is documented at less than 170 impression will be held. He'll be hydrated overnight, discharged home in the morning and get follow-up Dopplers in our [place] office next week after which he will see me back in follow-up.    [name] MD, [facility] 03/16/2014 9:21 AM

## 2014-03-16 NOTE — Interval H&P Note (Signed)
History and Physical Interval Note:  03/16/2014 7:33 AM  Brady Brooks Sr.  has presented today for surgery, with the diagnosis of pvd  The various methods of treatment have been discussed with the patient and family. After consideration of risks, benefits and other options for treatment, the patient has consented to  Procedure(s): LOWER EXTREMITY ANGIOGRAM (N/A) as a surgical intervention .  The patient's history has been reviewed, patient examined, no change in status, stable for surgery.  I have reviewed the patient's chart and labs.  Questions were answered to the patient's satisfaction.     [name]

## 2014-03-17 ENCOUNTER — Encounter (HOSPITAL_COMMUNITY): Payer: Self-pay | Admitting: Physician Assistant

## 2014-03-17 ENCOUNTER — Other Ambulatory Visit: Payer: Self-pay | Admitting: Physician Assistant

## 2014-03-17 DIAGNOSIS — T82858A Stenosis of vascular prosthetic devices, implants and grafts, initial encounter: Secondary | ICD-10-CM | POA: Diagnosis not present

## 2014-03-17 DIAGNOSIS — R911 Solitary pulmonary nodule: Secondary | ICD-10-CM | POA: Insufficient documentation

## 2014-03-17 DIAGNOSIS — D72829 Elevated white blood cell count, unspecified: Secondary | ICD-10-CM

## 2014-03-17 DIAGNOSIS — Z9889 Other specified postprocedural states: Secondary | ICD-10-CM

## 2014-03-17 DIAGNOSIS — I739 Peripheral vascular disease, unspecified: Secondary | ICD-10-CM

## 2014-03-17 LAB — CBC
HEMATOCRIT: 43.5 % (ref 39.0–52.0)
HEMOGLOBIN: 14.7 g/dL (ref 13.0–17.0)
MCH: 34.1 pg — ABNORMAL HIGH (ref 26.0–34.0)
MCHC: 33.8 g/dL (ref 30.0–36.0)
MCV: 100.9 fL — AB (ref 78.0–100.0)
Platelets: 160 10*3/uL (ref 150–400)
RBC: 4.31 MIL/uL (ref 4.22–5.81)
RDW: 13 % (ref 11.5–15.5)
WBC: 17.4 10*3/uL — ABNORMAL HIGH (ref 4.0–10.5)

## 2014-03-17 LAB — BASIC METABOLIC PANEL
Anion gap: 14 (ref 5–15)
BUN: 16 mg/dL (ref 6–23)
CHLORIDE: 108 meq/L (ref 96–112)
CO2: 17 meq/L — AB (ref 19–32)
Calcium: 8.3 mg/dL — ABNORMAL LOW (ref 8.4–10.5)
Creatinine, Ser: 0.69 mg/dL (ref 0.50–1.35)
GFR calc Af Amer: >90 mL/min (ref >90)
GFR calc non Af Amer: >90 mL/min (ref >90)
Glucose, Bld: 114 mg/dL — ABNORMAL HIGH (ref 70–99)
Potassium: 4.5 mEq/L (ref 3.7–5.3)
Sodium: 139 mEq/L (ref 137–147)

## 2014-03-17 MED ORDER — ASPIRIN 81 MG PO TBEC: 81.0000 mg | DELAYED_RELEASE_TABLET | Freq: Every day | ORAL | Status: DC

## 2014-03-17 NOTE — Discharge Summary (Signed)
Discharge Summary   Patient ID: Brady SWOPES Sr. MRN: [id number], DOB/AGE: 05/26/1950 63 y.o. Admit date: 03/16/2014 D/C date:     03/17/2014  Primary Care Provider: Glendon Axe, MD Primary Cardiologist: Dr. Claudie Leach PV: Dr. Gwenlyn Found  Primary Discharge Diagnoses:  1. PVD - history of stent to right common iliac artery 12/15/99 with a peak for balloon-expandable stent - this admission - s/p successful intervention on his right common iliac artery chronic total occlusion with PTA and stent using an ICast covered stent, residual disease on the right for possible staged intervention, notable disease on the left but asymptomatic  2. Leukocytosis of unclear etiology, possibly procedural-related  Secondary Discharge Diagnoses:  1. Recently diagnosed lung nodule suspicious for malignancy 2. CAD s/p RCA stenting back in 2005 with a known chronically occluded circumflex and normal LV function 3. Tobacco abuse 4. Hyperlipidemia 5. Obesity Body mass index is 30.24 kg/(m^2).  6. COPD 7. GERD 8. Hernia 9. Hypertension  Hospital Course: Brady Brooks is a 63 y/o M with history of PVD s/p stent to right common iliac artery 12/15/99, CAD s/p RCA stenting back in 2005 with a known chronically occluded circumflex and normal LV function, tobacco abuse, HLD, obesity and recent abnormal CT scan with lung nodule suspicious for malignancy. He presented to Dr. Kennon Holter office with progressive claudication in his right hip buttock and leg. Recent workup performed by Dr. Claudie Leach revealed a right ABI of 0.43 with what appeared to be an occluded right common iliac and SFA. A CT scan confirmed iliac occlusion. He underwent arterial Doppler studies performed 01/30/14 revealing a right ABI of 0.31 with an occluded right common iliac and SFA. His left ABI was .85 with a moderately high-frequency signal in his distal left SFA (see report). He also had a nodule on his preoperative chest x-ray which was confirmed to be a 5 x 6  mm millimeter right upper lobe nodule by CT scanning suspicious for malignancy and patient with a history of tobacco abuse. The patient will seek further evaluation from a pulmonologist (has plans to see Dr. Annamaria Boots). He was admitted for PV angiography yesterday and found to have: 1: Abdominal aortogram-the renal arteries widely patent. The infrarenal doll aorta was free of significant atherosclerotic disease. 2: Left lower extremity-there was a 60% proximal left common iliac artery stenosis with a 20 mm pullback gradient noted after administration of 200 g of intra-arterial nitroglycerin. There was an 80% ulcerated plaque in the mid to distal left SFA with 3 vessel runoff 3: The right common iliac artery stent was occluded. The iliac artery reconstituted by lumbar and hypogastric collaterals just proximal to the takeoff of the hypogastric artery. There was a moderately long segment occlusion of the mid right SFA with 3 vessel runoff. 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 The patient has residual right SFA chronic total occlusion which may need to be percutaneously addressed in a staged fashion. He denies symptoms on the left at this time although he does have a 20 mm gradient across the 60% proximal left common iliac artery stenosis as well as 80% ulcerated plaque in the distal left SFA. Dual antiplatelet therapy was recommended. He has a reported h/o sneezing with aspirin but tolerated this in the hospital without complication- he was advised to monitor for any issues. Also of note he has reported h/o HTN but his BPs were largely controlled in  the hospital with an occasional outlier - was asked to f/u PCP for this. Dr. Gwenlyn Found recommended f/u right LE duplex in the [place] office next week after which he will see the patient in followup. I have left a message on our office's scheduling  inbox requesting these, and our office will call the patient. Of note, WBC was 17.4 today. He had no focal infective symptoms and no fever. We have given him a lab slip to take to [facility] in 2-3 days for a recheck - d/w MD. Dr. Ellyn Brooks has seen and examined the patient today and feels he is stable for discharge.  Discharge Vitals: Blood pressure 144/73, pulse 63, temperature 97.9 F (36.6 C), temperature source Oral, resp. rate 18, height 5\' 10"  (1.778 m), weight 210 lb 12.2 oz (95.6 kg), SpO2 97 %. General appearance: alert and no distress Neck: no adenopathy, no carotid bruit, no JVD, supple, symmetrical, trachea midline and thyroid not enlarged, symmetric, no tenderness/mass/nodules Lungs: clear to auscultation bilaterally Heart: regular rate and rhythm, S1, S2 normal, no murmur, click, rub or gallop Extremities: extremities normal, atraumatic, no cyanosis or edema, left groin without complication  Labs: Lab Results  Component Value Date   WBC 17.4* 03/17/2014   HGB 14.7 03/17/2014   HCT 43.5 03/17/2014   MCV 100.9* 03/17/2014   PLT 160 03/17/2014     Recent Labs Lab 03/17/14 0409  NA 139  K 4.5  CL 108  CO2 17*  BUN 16  CREATININE 0.69  CALCIUM 8.3*  GLUCOSE 114*   Diagnostic Studies/Procedures   Ct Chest Wo Contrast  03/06/2014   CLINICAL DATA:  63 year old male with abnormal chest x-ray. Followup evaluation to exclude underlying pulmonary nodule.  EXAM: CT CHEST WITHOUT CONTRAST  TECHNIQUE: Multidetector CT imaging of the chest was performed following the standard protocol without IV contrast.  COMPARISON:  No priors.  FINDINGS: Mediastinum: Heart size is normal. There is no significant pericardial fluid, thickening or pericardial calcification. There is atherosclerosis of the thoracic aorta, the great vessels of the mediastinum and the coronary arteries, including calcified atherosclerotic plaque in the left main, left anterior descending, left circumflex and right coronary  arteries. Ascending thoracic aorta is mildly ectatic measuring 4 cm in diameter. Arch and descending thoracic aorta are normal in caliber, both measuring 2.8 cm in diameter. Calcifications of the aortic valve. No pathologically enlarged mediastinal or hilar lymph nodes. Please note that accurate exclusion of hilar adenopathy is limited on noncontrast CT scans. Esophagus is unremarkable in appearance.  Lungs/Pleura: The opacity on the recent chest x-ray corresponds to an area of scarring in the inferior segment of the lingula which abuts the border of the apex of the left ventricle. 6 x 3 mm subpleural nodule in the periphery of the left lower lobe (image 48 of series 3). No acute consolidative airspace disease. No pleural effusions. Mild diffuse bronchial wall thickening with mild centrilobular and moderate paraseptal emphysema. Mild linear scarring in the medial aspect of the right upper lobe. No pleural effusions.  Upper Abdomen: Status post cholecystectomy. Exophytic 2.7 cm low-attenuation lesion incompletely visualized in the upper pole of the right kidney is not characterized on today's examination, but is likely to represent a cyst. Atherosclerosis.  Musculoskeletal: There are no aggressive appearing lytic or blastic lesions noted in the visualized portions of the skeleton.  IMPRESSION: 1. No the area of concern on the recent chest radiograph corresponds to a benign area of post infectious or inflammatory scarring in the lingula. 2.  However, there are 2 incidental pulmonary nodules noted in the lungs bilaterally, as above. The largest of these measures 6 x 5 mm in the right upper lobe (image 31 of series 3). Given the obvious smoking related changes in the lungs, the patient is at high risk for bronchogenic carcinoma, and accordingly a follow-up chest CT at 6 months is recommended. This recommendation follows the consensus statement: Guidelines for Management of Small Pulmonary Nodules Detected on CT Scans: A  Statement from the [place] as published in Radiology 2005;237:395-400. 3. Diffuse bronchial wall thickening with mild centrilobular and moderate paraseptal emphysema; imaging findings suggestive of underlying COPD. 4. Atherosclerosis, including left main and 3 vessel coronary artery disease. Please note that although the presence of coronary artery calcium documents the presence of coronary artery disease, the severity of this disease and any potential stenosis cannot be assessed on this non-gated CT examination. In addition, there is mild ectasia of the ascending thoracic aorta (4.0 cm in diameter). Assessment for potential risk factor modification, dietary therapy or pharmacologic therapy may be warranted, if clinically indicated. 5. Calcifications of the aortic valve. 6. Additional incidental findings, as above.   Electronically Signed   By: Vinnie Langton M.D.   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 Durwin Nora Little's and now sees Dr. Claudie Leach. He has a history of CAD status post RCA stenting back in 2005 with a known chronically occluded circumflex and normal LV function. His cardiac risk factor profile is notable for tobacco abuse and treated hyperlipidemia. He denies chest pain or shortness of breath. I stented his right common iliac artery 12/15/99 with a balloon-expandable stent (8 mm x 2 cm). He had excellent angiographic and clinical results. Over the last 2-3 years she's had progressive claudication in his right hip buttock and leg. Recent workup performed by Dr. Claudie Leach revealed a right ABI of 0.43 with what appeared to be an occluded right common iliac and SFA. A CT scan confirmed iliac  occlusion. Since I saw him in the office 01/22/14 he had arterial Doppler studies performed 01/30/14 revealing a right ABI of 0.31 with an occluded right common iliac and SFA. His left ABI was 25 with a high frequency signal in his distal left SFA. He is symptomatic on the right with Rutherford class IV claudication. He also had a nodule on his preoperative chest x-ray which was confirmed to be a 5 x 6 mm millimeter right upper lobe nodule by CT scanning suspicious for malignancy and patient with a history of tobacco abuse. The patient will seek further evaluation from a pulmonologist. He presents now for angiography and potential intervention of his occluded right common iliac artery.  PROCEDURE DESCRIPTION:   The patient was brought to the second floor [facility] lab in the postabsorptive state. He was premedicated with Valium 5 mg by mouth and fentanyl IV. His leftwas prepped and shaved in usual sterile fashion. Xylocaine 1% was used for local anesthesia. A 5 French sheath was inserted into the left common femoral artery using standard Seldinger technique. A 5 French pigtail catheter was placed at the level of the renal arteries. Abdominal aortogram, bilateral iliac and gram and with bifemoral runoff was performed using bolus chase digital  subtraction step up table technique. Omnipaque dye was used for the entirety of the case. Retrograde aortic pressure was monitored during the case.  HEMODYNAMICS:   AO SYSTOLIC/AO DIASTOLIC: 923/30  Angiographic Data:   1: Abdominal aortogram-the renal arteries widely patent. The infrarenal doll aorta was free of significant atherosclerotic disease.  2: Left lower extremity-there was a 60% proximal left common iliac artery stenosis with a 20 mm pullback gradient noted after administration of 200 g of intra-arterial nitroglycerin. There was an 80% ulcerated plaque in the mid to distal left SFA with 3 vessel runoff  3: The right common iliac  artery stent was occluded. The iliac artery reconstituted by lumbar and hypogastric collaterals just proximal to the takeoff of the hypogastric artery. There was a moderately long segment occlusion of the mid right SFA with 3 vessel runoff.  IMPRESSION:Brady Brooks has an occluded proximal right common iliac artery stent. We'll proceed with attempt at percutaneous revascularization using a ICast covered stent.  Procedure Description:the patient received a total of 7000 units of heparin intravenously with an ACT documented at 230. Total contrast administered during the case was 301 mL. A 7 French bright tip sheath was then inserted into the right common femoral artery. Attempts were made to cross the chronic total occlusion with a Viance catheter and an Astata 20 CTO wire unsuccessfully. I then switched to a angled Navicross and a stiff angled Glidewire. I was able to cross the chronic total occlusion exchanged for an 0.35 Versicore Wire. I dilated the occluded segment with a 5 mm x 4 cm balloon and stented the common iliac with a 7 mm x 38 mm long ICast covered stent. I then postdilated the entire stented segment with an 8 mm x 2 cm balloon at nominal pressures resulting in the reduction of a total occlusion to 0% residual. Completion abdominal and iliac angiography was then performed with the pigtail catheter.  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 The patient has residual right SFA chronic total occlusion which may need to be percutaneously addressed in a staged fashion. He denies symptoms on the left at this time although he does have a 20 mm gradient across the 60% proximal left common iliac artery stenosis as well as 80% ulcerated plaque in the distal left SFA. He'll be treated with dual antibiotic therapy, but sheaths will be removed once the ACT is documented at less than 170 impression will be held. He'll be hydrated  overnight, discharged home in the morning and get follow-up Dopplers in our [place] office next week after which he will see me back in follow-up.  [name] MD, [facility] 03/16/2014 9:21 AM  Discharge Medications   Current Discharge Medication List    START taking these medications   Details  aspirin EC 81 MG EC tablet Take 1 tablet (81 mg total) by mouth daily. Qty: 30 tablet, Refills: 11      CONTINUE these medications which have NOT CHANGED   Details  clopidogrel (PLAVIX) 75 MG tablet Take 75 mg by mouth daily.     simvastatin (ZOCOR) 40 MG tablet Take 40 mg by mouth daily.        Disposition   The patient will be discharged in stable condition to home. Discharge Instructions    Diet - low sodium heart healthy    Complete by:  As directed      Increase activity slowly    Complete by:  As directed   No driving for 2 days. No lifting over 5 lbs for 1 week. No sexual activity for 1 week. You may return to work on Friday 03/20/14 if you are feeling well. Keep procedure site clean & dry. If you notice increased pain, swelling, bleeding or pus, call/return!  You may shower, but no soaking baths/hot tubs/pools for 1 week.          Follow-up Information    Follow up with [name], MD.   Specialty:  Cardiology   Why:  Office will call you for your followup appointment. Call office if you have not heard back in 3 days.   Contact information:   [address] [place] [address] [phone]       Follow up with Primary Care Doctor/Pulmonologist.   Why:  Dr. Gwenlyn Found wants you to continue to follow up with your primary doctor for evaluation of your lung nodule.      Follow up with [facility].   Why:  Your white blood cell count was somewhat elevated but there was no evidence of infection. Please have a repeat blood count at the end of this week (either Thursday or Friday) to recheck. [facility] lab slip [facility].   Contact information:    [facility] -  1st floor of the building that Dr. Gwenlyn Found is in  [address] [place], [place] [address] [phone] (Phone) (Fax) 8:00am-5:00pm      Follow up with Glendon Axe, MD.   Specialty:  Family Medicine   Why:  Your blood pressure was occasionally elevated in the hospital. Please monitor this at home and follow up with primary doctor for this.   Contact information:   [place] [address] [phone]         Duration of Discharge Encounter: Greater than 30 minutes including physician and PA time.  Signed, Dayna Dunn PA-C 03/17/2014, 10:12 AM

## 2014-03-19 ENCOUNTER — Other Ambulatory Visit: Payer: Self-pay

## 2014-03-19 ENCOUNTER — Telehealth: Payer: Self-pay | Admitting: Cardiovascular Disease

## 2014-03-19 DIAGNOSIS — D72829 Elevated white blood cell count, unspecified: Secondary | ICD-10-CM

## 2014-03-19 DIAGNOSIS — I739 Peripheral vascular disease, unspecified: Secondary | ICD-10-CM

## 2014-03-19 NOTE — Telephone Encounter (Signed)
Attempted to call Katrina back but phone line is busy.

## 2014-03-20 LAB — CBC WITH DIFFERENTIAL/PLATELET
BASOS ABS: 0 10*3/uL (ref 0.0–0.1)
Basophils Relative: 0 % (ref 0–1)
Eosinophils Absolute: 0.1 10*3/uL (ref 0.0–0.7)
Eosinophils Relative: 1 % (ref 0–5)
HCT: 47.8 % (ref 39.0–52.0)
Hemoglobin: 16.7 g/dL (ref 13.0–17.0)
LYMPHS PCT: 19 % (ref 12–46)
Lymphs Abs: 2.3 10*3/uL (ref 0.7–4.0)
MCH: 34.4 pg — ABNORMAL HIGH (ref 26.0–34.0)
MCHC: 34.9 g/dL (ref 30.0–36.0)
MCV: 98.6 fL (ref 78.0–100.0)
MPV: 11.4 fL (ref 9.4–12.4)
Monocytes Absolute: 1.3 10*3/uL — ABNORMAL HIGH (ref 0.1–1.0)
Monocytes Relative: 11 % (ref 3–12)
NEUTROS ABS: 8.3 10*3/uL — AB (ref 1.7–7.7)
NEUTROS PCT: 69 % (ref 43–77)
PLATELETS: 221 10*3/uL (ref 150–400)
RBC: 4.85 MIL/uL (ref 4.22–5.81)
RDW: 13.2 % (ref 11.5–15.5)
WBC: 12.1 10*3/uL — AB (ref 4.0–10.5)

## 2014-03-23 ENCOUNTER — Telehealth: Payer: Self-pay | Admitting: Cardiovascular Disease

## 2014-03-24 NOTE — Telephone Encounter (Signed)
Closed encounter °

## 2014-03-30 ENCOUNTER — Ambulatory Visit (HOSPITAL_COMMUNITY)
Admission: RE | Admit: 2014-03-30 | Discharge: 2014-03-30 | Disposition: A | Discharge status: Home / Self Care | Payer: BC Managed Care – PPO | Source: Ambulatory Visit | Attending: Cardiology | Admitting: Cardiology

## 2014-03-30 DIAGNOSIS — I739 Peripheral vascular disease, unspecified: Secondary | ICD-10-CM | POA: Diagnosis present

## 2014-03-30 NOTE — Progress Notes (Signed)
Right Lower Ext. Arterial Duplex Completed. Janiel Crisostomo, BS, RDMS, RVT  

## 2014-04-03 ENCOUNTER — Ambulatory Visit (INDEPENDENT_AMBULATORY_CARE_PROVIDER_SITE_OTHER): Payer: BC Managed Care – PPO | Admitting: Cardiovascular Disease

## 2014-04-03 ENCOUNTER — Encounter: Payer: Self-pay | Admitting: Cardiovascular Disease

## 2014-04-03 VITALS — BP 158/78 | HR 73 | Ht 70.0 in | Wt 212.0 lb

## 2014-04-03 DIAGNOSIS — I739 Peripheral vascular disease, unspecified: Secondary | ICD-10-CM

## 2014-04-03 NOTE — Assessment & Plan Note (Signed)
History of PAD status post right common iliac artery stenting 12/15/99 with an 8 mm x 2 cm balloon expandable stent. At that time he had excellent angiographic and clinical results. Over the last 2-3 years he's developed right hip and buttock claudication. Recent workup by Dr. Claudie Leach revealed a right ABI 0.43 with what appeared to be an occluded right common iliac and SFA. This was confirmed by CT scanning. I angiogram in 03/16/14 and was able to recanalize the proximal right common iliac artery chronic total occlusion and placed a 7 mm x 30 mm long IICast covered stent. His right ABI increased from 0.31 to .54 and his claudication has completely resolved.

## 2014-04-03 NOTE — Progress Notes (Signed)
04/03/2014 Brady Crumb Sr.   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 Durwin Nora Little's and now sees Dr. Claudie Leach. I last saw him in the office 03/11/14. He has a history of CAD status post RCA stenting back in 2005 with a known chronically occluded circumflex and normal LV function. His cardiac risk factor profile is notable for tobacco abuse and treated hyperlipidemia. He denies chest pain or shortness of breath. I stented his right common iliac artery 12/15/99 with a balloon-expandable stent (8 mm x 2 cm). He had excellent angiographic and clinical results. Over the last 2-3 years she's had progressive claudication in his right hip buttock and leg. Recent workup performed by Dr. Claudie Leach revealed a right ABI of 0.43 with what appeared to be an occluded right common iliac and SFA. A CT scan confirmed iliac occlusion. Since I saw him in the office 01/22/14 he had arterial Doppler studies performed 01/30/14 revealing a right ABI of 0.31 with an occluded right common iliac and SFA. His left ABI was 25 with a high frequency signal in his distal left SFA. He is symptomatic on the right with Rutherford class IV claudication. He also had a nodule on his preoperative chest x-ray which was confirmed to be a 5 x 6 mm millimeter right upper lobe nodule by CT scanning suspicious for malignancy and patient with a history of tobacco abuse. He has an appointment to see Dr. Keturah Barre next month for further evaluation of this. I performed angiography on him 03/16/14 and was able to percutaneously recanalize his right common iliac artery chronic total occlusion and placed a 7 mm x 30 mm long  ICast  covered stent. His right ABI improved from .31-.54 and his right hip and lower; claudication has completely resolved.  Current Outpatient Prescriptions  Medication Sig Dispense Refill  . aspirin EC 81 MG EC tablet Take 1 tablet (81 mg total) by mouth daily. 30 tablet 11  . clopidogrel (PLAVIX) 75 MG tablet Take 75 mg by mouth daily.     . simvastatin (ZOCOR) 40 MG tablet Take 40 mg by mouth daily.     No current facility-administered medications for this visit.    Allergies  Allergen Reactions  . Aspirin     Sneezes - but tolerating as of 03/2014 admission  . Contrast Media [Iodinated Diagnostic Agents]     Need to be pre-medicated  . Erythromycin     All mycin drugs  . Penicillins   . Sulfa Antibiotics     History   Social History  . Marital Status: Married    Spouse Name: N/A    Number of Children: N/A  . Years of Education: N/A   Occupational History  . Not on file.   Social History Main Topics  . Smoking status: Current Every Day Smoker -- 1.00 packs/day for 30 years    Types: Cigarettes  . Smokeless tobacco: Never Used  . Alcohol Use: No  . Drug Use: No  . Sexual Activity: Not on file   Other Topics Concern  . Not on file   Social History Narrative     Review of Systems: General: negative for chills, fever,  night sweats or weight changes.  Cardiovascular: negative for chest pain, dyspnea on exertion, edema, orthopnea, palpitations, paroxysmal nocturnal dyspnea or shortness of breath Dermatological: negative for rash Respiratory: negative for cough or wheezing Urologic: negative for hematuria Abdominal: negative for nausea, vomiting, diarrhea, bright red blood per rectum, melena, or hematemesis Neurologic: negative for visual changes, syncope, or dizziness All other systems reviewed and are otherwise negative except as noted above.    Blood pressure 158/78, pulse 73, height 5\' 10"  (1.778 m), weight 212 lb (96.163 kg).  General appearance: alert  and no distress Neck: no adenopathy, no carotid bruit, no JVD, supple, symmetrical, trachea midline and thyroid not enlarged, symmetric, no tenderness/mass/nodules Lungs: clear to auscultation bilaterally Heart: regular rate and rhythm, S1, S2 normal, no murmur, click, rub or gallop Extremities: extremities normal, atraumatic, no cyanosis or edema and his right femoral arterial puncture site is well-healed with a very soft bruit and 2+ pulses  EKG not performed today  ASSESSMENT AND PLAN:   Claudication History of PAD status post right common iliac artery stenting 12/15/99 with an 8 mm x 2 cm balloon expandable stent. At that time he had excellent angiographic and clinical results. Over the last 2-3 years he's developed right hip and buttock claudication. Recent workup by Dr. Claudie Leach revealed a right ABI 0.43 with what appeared to be an occluded right common iliac and SFA. This was confirmed by CT scanning. I angiogram in 03/16/14 and was able to recanalize the proximal right common iliac artery chronic total occlusion and placed a 7 mm x 30 mm long IICast covered stent. His right ABI increased from 0.31 to .54 and his claudication has completely resolved.      [name] MD FACP,FACC,FAHA, [facility] 04/03/2014 11:28 AM

## 2014-04-03 NOTE — Patient Instructions (Signed)
Dr Berry recommends that you follow-up with him as needed. 

## 2014-04-09 ENCOUNTER — Encounter (HOSPITAL_COMMUNITY): Payer: Self-pay | Admitting: Cardiovascular Disease

## 2014-05-12 ENCOUNTER — Encounter: Payer: Self-pay | Admitting: Internal Medicine

## 2014-05-12 ENCOUNTER — Other Ambulatory Visit: Payer: BLUE CROSS/BLUE SHIELD

## 2014-05-12 ENCOUNTER — Ambulatory Visit (INDEPENDENT_AMBULATORY_CARE_PROVIDER_SITE_OTHER): Payer: BLUE CROSS/BLUE SHIELD | Admitting: Internal Medicine

## 2014-05-12 VITALS — BP 168/76 | HR 91 | Ht 70.0 in | Wt 219.6 lb

## 2014-05-12 DIAGNOSIS — D72829 Elevated white blood cell count, unspecified: Secondary | ICD-10-CM

## 2014-05-12 DIAGNOSIS — J309 Allergic rhinitis, unspecified: Secondary | ICD-10-CM

## 2014-05-12 DIAGNOSIS — R911 Solitary pulmonary nodule: Secondary | ICD-10-CM

## 2014-05-12 DIAGNOSIS — J302 Other seasonal allergic rhinitis: Secondary | ICD-10-CM

## 2014-05-12 DIAGNOSIS — Z889 Allergy status to unspecified drugs, medicaments and biological substances status: Secondary | ICD-10-CM

## 2014-05-12 DIAGNOSIS — H6123 Impacted cerumen, bilateral: Secondary | ICD-10-CM

## 2014-05-12 DIAGNOSIS — Z720 Tobacco use: Secondary | ICD-10-CM

## 2014-05-12 DIAGNOSIS — H6120 Impacted cerumen, unspecified ear: Secondary | ICD-10-CM | POA: Insufficient documentation

## 2014-05-12 DIAGNOSIS — J3089 Other allergic rhinitis: Secondary | ICD-10-CM

## 2014-05-12 DIAGNOSIS — Z888 Allergy status to other drugs, medicaments and biological substances status: Secondary | ICD-10-CM

## 2014-05-12 DIAGNOSIS — R918 Other nonspecific abnormal finding of lung field: Secondary | ICD-10-CM

## 2014-05-12 DIAGNOSIS — J449 Chronic obstructive pulmonary disease, unspecified: Secondary | ICD-10-CM

## 2014-05-12 LAB — CBC
HEMATOCRIT: 47.6 % (ref 39.0–52.0)
Hemoglobin: 16.7 g/dL (ref 13.0–17.0)
MCH: 34.2 pg — ABNORMAL HIGH (ref 26.0–34.0)
MCHC: 35.1 g/dL (ref 30.0–36.0)
MCV: 97.3 fL (ref 78.0–100.0)
MPV: 11.1 fL (ref 8.6–12.4)
Platelets: 240 10*3/uL (ref 150–400)
RBC: 4.89 MIL/uL (ref 4.22–5.81)
RDW: 13.5 % (ref 11.5–15.5)
WBC: 10.9 10*3/uL — ABNORMAL HIGH (ref 4.0–10.5)

## 2014-05-12 MED ORDER — VARENICLINE TARTRATE 0.5 MG X 11 & 1 MG X 42 PO MISC
ORAL | Status: DC
Start: 2014-05-12 — End: 2014-05-15

## 2014-05-12 NOTE — Progress Notes (Signed)
05/12/14- 53 yoM 1ppd smoker referred courtesy of Dr Farrel Demark nodule on CXR and CT Chest (Wife =Pam) Complicated by Tobacco use, COPD, CAD, PAD/ claudication, HBP CXR showed LLL basilar density. CT indicates this is scar, but 2 incidental small nodules were noted, needing f/u. Incidental Note Aspirin allergy- each time tries bASA gets sneeze, wheeze and rhinorrhea. No hx nasal polyps or asthma. He has smoked most of his adult life. 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 Remote exposure to metal working fluids but not to cotton dust or asbestos. Denies history of pneumonia or asthma. Has had flu and pneumonia vaccines. Complains ears stopped up-has needed ENT to remove wax in past.  CT chest  03/06/14    Images reviewed with him IMPRESSION: 1. No the area of concern on the recent chest radiograph corresponds to a benign area of post infectious or inflammatory scarring in the lingula. 2. However, there are 2 incidental pulmonary nodules noted in the lungs bilaterally, as above. The largest of these measures 6 x 5 mm in the right upper lobe (image 31 of series 3). Given the obvious smoking related changes in the lungs, the patient is at high risk for bronchogenic carcinoma, and accordingly a follow-up chest CT at 6 months is recommended. This recommendation follows the consensus statement: Guidelines for Management of Small Pulmonary Nodules Detected on CT Scans: A Statement from the [place] as published in Radiology 2005;237:395-400. 3. Diffuse bronchial wall thickening with mild centrilobular and moderate paraseptal emphysema; imaging findings suggestive of underlying COPD. 4. Atherosclerosis, including left main and 3 vessel coronary artery disease. Please note that although the presence of coronary artery calcium documents the presence of coronary artery disease, the severity of this disease and any potential stenosis cannot be assessed on this  non-gated CT examination. In addition, there is mild ectasia of the ascending thoracic aorta (4.0 cm in diameter). Assessment for potential risk factor modification, dietary therapy or pharmacologic therapy may be warranted, if clinically indicated. 5. Calcifications of the aortic valve. 6. Additional incidental findings, as above. Electronically Signed  By: Vinnie Langton M.D.  On: 03/06/2014 08:47  Prior to Admission medications   Medication Sig Start Date End Date Taking? Authorizing Provider  clopidogrel (PLAVIX) 75 MG tablet Take 75 mg by mouth daily.    Yes Historical Provider, MD  simvastatin (ZOCOR) 40 MG tablet Take 40 mg by mouth daily.   Yes Historical Provider, MD  varenicline (CHANTIX PAK) 0.5 MG X 11 & 1 MG X 42 tablet Take one 0.5 mg tablet by mouth once daily for 3 days, then increase to one 0.5 mg tablet twice daily for 4 days, then increase to one 1 mg tablet twice daily. 05/12/14   Deneise Lever, MD   Past Medical History  Diagnosis Date  . Coronary artery disease     a. s/p RCA stenting back in 2005 with a known chronically occluded circumflex and normal LV function.  Marland Kitchen Heart attack   . GERD (gastroesophageal reflux disease)   . Hernia   . Peripheral arterial disease     a. history of stent to right common iliac artery 12/15/99 with a peak for balloon-expandable stent. b. s/p intervention on RCIA total occlusion PTA and stent, residual disease on the right for possible staged intervention, notable disease on the left but asymptomatic.  Marland Kitchen Hyperlipidemia   . Hypertension   . COPD (chronic obstructive pulmonary disease)   . History of tobacco abuse   .  Lung nodule     a. Suspicious for malignancy, undergoing workup.  . Obesity    Past Surgical History  Procedure Laterality Date  . Coronary angioplasty with stent placement    . Cholecystectomy    . Hernia repair  2012    Umb. hernia repair  . Cardiac catheterization  2001    Taxus stent placed to his RCA in  2005  . Cardiolite study      59% ejection fraction and negative for ischemia  . Iliac vein angioplasty / stenting Right 03/16/2014    rt common iliac    by dr berry  . Lower extremity angiogram N/A 03/16/2014    Procedure: LOWER EXTREMITY ANGIOGRAM;  Surgeon: [name], MD;  Location: [facility];  Service: Cardiovascular;  Laterality: N/A;   Family History  Problem Relation Age of Onset  . Heart disease Father   . Asthma Son   . Lung cancer Paternal Grandfather   . CAD Brother   . CAD Brother    History   Social History  . Marital Status: Married    Spouse Name: Olin Hauser    Number of Children: 2  . Years of Education: N/A   Occupational History  . textile manf    Social History Main Topics  . Smoking status: Current Every Day Smoker -- 1.00 packs/day for 30 years    Types: Cigarettes  . Smokeless tobacco: Never Used  . Alcohol Use: No  . Drug Use: No  . Sexual Activity: Not on file   Other Topics Concern  . Not on file   Social History Narrative   ROS-see HPI Constitutional:   No-   weight loss, night sweats, fevers, chills, fatigue, lassitude. HEENT:   No-  headaches, difficulty swallowing, tooth/dental problems, sore throat,       No-  sneezing, itching, ear ache, nasal congestion, post nasal drip,  CV:  No-   chest pain, orthopnea, PND, swelling in lower extremities, anasarca,  dizziness, palpitations Resp: No-   shortness of breath with exertion or at rest.              No-   productive cough,  No non-productive cough,  No- coughing up of blood.              No-   change in color of mucus.  No- wheezing.   Skin: No-   rash or lesions. GI:  No-   heartburn, indigestion, abdominal pain, nausea, vomiting, diarrhea,                 change in bowel habits, loss of appetite GU: No-   dysuria, change in color of urine, no urgency or frequency.  No- flank pain. MS:  No-   joint pain or swelling.  No- decreased range of motion.  No- back pain. Neuro-     nothing  unusual Psych:  No- change in mood or affect. No depression or anxiety.  No memory loss.  OBJ- Physical Exam General- Alert, Oriented, Affect-appropriate, Distress- none acute Skin- rash-none, lesions- none, excoriation- none Lymphadenopathy- none Head- atraumatic            Eyes- Gross vision intact, PERRLA, conjunctivae and secretions clear            Ears- Hearing okay, + thick cerumen            Nose- +turbinate edema, no-Septal dev, mucus, polyps, erosion, perforation  Throat- Mallampati III , mucosa clear , drainage- none, tonsils- atrophic, own teeth Neck- flexible , trachea midline, no stridor , thyroid nl, carotid no bruit Chest - symmetrical excursion , unlabored           Heart/CV- RRR , no murmur , no gallop  , no rub, nl s1 s2                           - JVD- none , edema- none, stasis changes- none, varices- none           Lung- +crackles R base, wheeze- none, cough- none , dullness-none, rub- none           Chest wall-  Abd- tender-no, distended-no, bowel sounds-present, HSM- no Br/ Gen/ Rectal- Not done, not indicated Extrem- cyanosis- none, clubbing, none, atrophy- none, strength- nl Neuro- grossly intact to observation

## 2014-05-12 NOTE — Assessment & Plan Note (Addendum)
Non-specific nodules. Discussed follow-up and increased motivation to stop smoking. Plan- F/U CT in 6 months

## 2014-05-12 NOTE — Assessment & Plan Note (Signed)
Recurrent problem Plan- he will go back to ENT

## 2014-05-12 NOTE — Assessment & Plan Note (Signed)
Off season now. Noting stuffiness and some drainage.  Plan- Suggested otc flonase for trial

## 2014-05-12 NOTE — Patient Instructions (Addendum)
Order- future CT chest no contrast in 6 months   Dx lung nodules  Order- schedule PFT    Dx COPD mixed type  Script for Chantix to help with smoking cesation. Follow the directions that come with it.   Please try very hard to stop smoking. Now is the time. It is important for your heart, your lungs and your arteries.  Order - lab Aspirin IgE    Dx aspirin allergy  For the stuffy nose, which includes some allergy, try otc Flonase nasal spray  See Dr Ernesto Rutherford again for cerumen/ ear wax problem

## 2014-05-12 NOTE — Assessment & Plan Note (Signed)
He has not been successful trying on his own to quit. Wife smokes in home. Discussed Chantix option including tolerance issues. He would like to try. Plan- Rx Chantix

## 2014-05-14 ENCOUNTER — Encounter: Payer: Self-pay | Admitting: Cardiovascular Disease

## 2014-05-14 ENCOUNTER — Telehealth: Payer: Self-pay | Admitting: Cardiovascular Disease

## 2014-05-14 NOTE — Telephone Encounter (Signed)
error 

## 2014-05-15 ENCOUNTER — Telehealth: Payer: Self-pay | Admitting: Internal Medicine

## 2014-05-15 MED ORDER — VARENICLINE TARTRATE 0.5 MG X 11 & 1 MG X 42 PO MISC: ORAL | Status: DC

## 2014-05-15 NOTE — Telephone Encounter (Signed)
Spoke with Pamela-patient's wife; she states that the patient did not get Rx for Chantix and needs this sent to pharmacy listed(confirmed). This has been taken care of. Wife then stated that she wanted to know if the ASA IgE level was back yet and the she received a call about patients CBC diff (ordered by another office) results and patient is seeing Dr Ernesto Rutherford on Tuesday 05-19-14 for ear issues.   Wife is aware that ASA IgE level is sent out to another lab to process and will call once we have results. Nothing more needed at this time.

## 2014-05-24 ENCOUNTER — Encounter: Payer: Self-pay | Admitting: Internal Medicine

## 2014-09-14 ENCOUNTER — Telehealth (HOSPITAL_COMMUNITY): Payer: Self-pay | Admitting: *Deleted

## 2014-09-15 ENCOUNTER — Other Ambulatory Visit: Payer: Self-pay | Admitting: Cardiovascular Disease

## 2014-09-15 DIAGNOSIS — I739 Peripheral vascular disease, unspecified: Secondary | ICD-10-CM

## 2014-09-22 ENCOUNTER — Encounter (HOSPITAL_COMMUNITY): Payer: BLUE CROSS/BLUE SHIELD

## 2014-10-01 ENCOUNTER — Telehealth (HOSPITAL_COMMUNITY): Payer: Self-pay | Admitting: *Deleted

## 2014-10-06 ENCOUNTER — Encounter (HOSPITAL_COMMUNITY): Payer: BLUE CROSS/BLUE SHIELD

## 2014-10-20 ENCOUNTER — Ambulatory Visit (HOSPITAL_COMMUNITY)
Admission: RE | Admit: 2014-10-20 | Discharge: 2014-10-20 | Disposition: A | Discharge status: Home / Self Care | Payer: BLUE CROSS/BLUE SHIELD | Source: Ambulatory Visit | Attending: Cardiovascular Disease | Admitting: Cardiovascular Disease

## 2014-10-20 DIAGNOSIS — I739 Peripheral vascular disease, unspecified: Secondary | ICD-10-CM | POA: Diagnosis not present

## 2014-10-23 ENCOUNTER — Telehealth: Payer: Self-pay | Admitting: *Deleted

## 2014-10-23 DIAGNOSIS — I739 Peripheral vascular disease, unspecified: Secondary | ICD-10-CM

## 2014-10-23 NOTE — Telephone Encounter (Signed)
Order placed for repeat lower extremity arterial doppler in 6 months  

## 2014-10-23 NOTE — Telephone Encounter (Signed)
-----   Message from [name], MD sent at 10/22/2014  4:00 PM EDT ----- No change from prior study. Repeat in 6 months

## 2014-11-30 ENCOUNTER — Ambulatory Visit (INDEPENDENT_AMBULATORY_CARE_PROVIDER_SITE_OTHER)
Admission: RE | Admit: 2014-11-30 | Discharge: 2014-11-30 | Disposition: A | Discharge status: Home / Self Care | Payer: BLUE CROSS/BLUE SHIELD | Source: Ambulatory Visit | Attending: Internal Medicine | Admitting: Internal Medicine

## 2014-11-30 DIAGNOSIS — R918 Other nonspecific abnormal finding of lung field: Secondary | ICD-10-CM

## 2014-12-01 ENCOUNTER — Other Ambulatory Visit: Payer: Self-pay | Admitting: Internal Medicine

## 2014-12-02 ENCOUNTER — Encounter: Payer: Self-pay | Admitting: Internal Medicine

## 2014-12-02 ENCOUNTER — Ambulatory Visit (INDEPENDENT_AMBULATORY_CARE_PROVIDER_SITE_OTHER): Payer: BLUE CROSS/BLUE SHIELD | Admitting: Internal Medicine

## 2014-12-02 VITALS — BP 146/80 | HR 71 | Ht 70.5 in | Wt 213.0 lb

## 2014-12-02 DIAGNOSIS — R911 Solitary pulmonary nodule: Secondary | ICD-10-CM

## 2014-12-02 DIAGNOSIS — R918 Other nonspecific abnormal finding of lung field: Secondary | ICD-10-CM | POA: Diagnosis not present

## 2014-12-02 DIAGNOSIS — J449 Chronic obstructive pulmonary disease, unspecified: Secondary | ICD-10-CM | POA: Diagnosis not present

## 2014-12-02 DIAGNOSIS — Z720 Tobacco use: Secondary | ICD-10-CM | POA: Diagnosis not present

## 2014-12-02 LAB — PULMONARY FUNCTION TEST
DL/VA % PRED: 71 %
DL/VA: 3.31 ml/min/mmHg/L
DLCO unc % pred: 62 %
DLCO unc: 20.57 ml/min/mmHg
FEF 25-75 POST: 0.93 L/s
FEF 25-75 Pre: 0.76 L/sec
FEF2575-%CHANGE-POST: 22 %
FEF2575-%PRED-PRE: 27 %
FEF2575-%Pred-Post: 33 %
FEV1-%CHANGE-POST: 10 %
FEV1-%PRED-PRE: 51 %
FEV1-%Pred-Post: 56 %
FEV1-POST: 2.01 L
FEV1-Pre: 1.83 L
FEV1FVC-%CHANGE-POST: 4 %
FEV1FVC-%PRED-PRE: 75 %
FEV6-%CHANGE-POST: 5 %
FEV6-%PRED-POST: 72 %
FEV6-%PRED-PRE: 69 %
FEV6-Post: 3.26 L
FEV6-Pre: 3.1 L
FEV6FVC-%CHANGE-POST: 0 %
FEV6FVC-%PRED-POST: 99 %
FEV6FVC-%Pred-Pre: 100 %
FVC-%Change-Post: 5 %
FVC-%PRED-PRE: 68 %
FVC-%Pred-Post: 72 %
FVC-POST: 3.43 L
FVC-Pre: 3.25 L
PRE FEV1/FVC RATIO: 56 %
Post FEV1/FVC ratio: 59 %
Post FEV6/FVC ratio: 95 %
Pre FEV6/FVC Ratio: 95 %

## 2014-12-02 MED ORDER — BUPROPION HCL ER (SMOKING DET) 150 MG PO TB12: 150.0000 mg | ORAL_TABLET | Freq: Two times a day (BID) | ORAL | Status: DC

## 2014-12-02 MED ORDER — UMECLIDINIUM-VILANTEROL 62.5-25 MCG/INH IN AEPB: 1.0000 | INHALATION_SPRAY | Freq: Every day | RESPIRATORY_TRACT | Status: DC

## 2014-12-02 NOTE — Progress Notes (Signed)
PFT done today. 

## 2014-12-02 NOTE — Patient Instructions (Signed)
Script sent to start Zyban.  Suggest you use up your Chantix taking one daily and taking 1 Zyban/ welbutrin daily. When you finish the Chanitix, increase Zyban to twice daily.   Please try hard not to smoke. It can help to chew gum, go for walks, and find things to do to distract yourself when you get cravings to smoke.    Sample Anoro inhaler --  1 puff, once daily.  See if this helps your breathing.  Order- schedule future CT chest non contrast for 6 months, dx lung nodules

## 2014-12-02 NOTE — Progress Notes (Signed)
05/12/14- 9 yoM 1ppd smoker referred courtesy of Dr Farrel Demark nodule on CXR and CT Chest (Wife =Pam) Complicated by Tobacco use, COPD, CAD, PAD/ claudication, HBP CXR showed LLL basilar density. CT indicates this is scar, but 2 incidental small nodules were noted, needing f/u. Incidental Note Aspirin allergy- each time tries bASA gets sneeze, wheeze and rhinorrhea. No hx nasal polyps or asthma. He has smoked most of his adult life. 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 Remote exposure to metal working fluids but not to cotton dust or asbestos. Denies history of pneumonia or asthma. Has had flu and pneumonia vaccines. Complains ears stopped up-has needed ENT to remove wax in past.  CT chest  03/06/14    Images reviewed with him IMPRESSION: 1. No the area of concern on the recent chest radiograph corresponds to a benign area of post infectious or inflammatory scarring in the lingula. 2. However, there are 2 incidental pulmonary nodules noted in the lungs bilaterally, as above. The largest of these measures 6 x 5 mm in the right upper lobe (image 31 of series 3). Given the obvious smoking related changes in the lungs, the patient is at high risk for bronchogenic carcinoma, and accordingly a follow-up chest CT at 6 months is recommended. This recommendation follows the consensus statement: Guidelines for Management of Small Pulmonary Nodules Detected on CT Scans: A Statement from the [place] as published in Radiology 2005;237:395-400. 3. Diffuse bronchial wall thickening with mild centrilobular and moderate paraseptal emphysema; imaging findings suggestive of underlying COPD. 4. Atherosclerosis, including left main and 3 vessel coronary artery disease. Please note that although the presence of coronary artery calcium documents the presence of coronary artery disease, the severity of this disease and any potential stenosis cannot be assessed on this  non-gated CT examination. In addition, there is mild ectasia of the ascending thoracic aorta (4.0 cm in diameter). Assessment for potential risk factor modification, dietary therapy or pharmacologic therapy may be warranted, if clinically indicated. 5. Calcifications of the aortic valve. 6. Additional incidental findings, as above. Electronically Signed  By: Vinnie Langton M.D.  On: 03/06/2014 08:47  12/02/14-  60 yoM 1ppd smoker followed forlung nodule on CXR and CT Chest,Complicated by Tobacco use, COPD, CAD, PAD/ claudication, HBP (Wife =Pam) Follows for: PFT done today and CT Chest done 11/30/14. Pt states not able to concentrate as well with taking chantix and also has been feeling nervous. Med has helped with cravings and he has been able to cut down to less than 1/2 ppd.  He states that his breathing is unchanged. Chantix has helped cut down smoking but makes him jittery and trouble concentrating. Asks about Zyban which we discussed. We reviewed his PFT and CT scan. PFT 12/02/14-severe obstructive airways disease with insignificant response to bronchodilator, air trapping, moderately reduced diffusion. FEV1/FVC 0.59, RV 139%, TLC 93%, DLCO 62%. CT chest 11/30/14 IMPRESSION: Stable bilateral pulmonary nodules measuring up to 6 mm. Continued followup by CT recommended in 6-9 months. This recommendation follows the consensus statement: Guidelines for Management of Small Pulmonary Nodules Detected on CT Scans: A Statement from the [place] as published in Radiology 2005; 237:395-400. Moderate emphysema. No acute findings. Electronically Signed  By: Earle Gell M.D.  On: 11/30/2014 09:20  ROS-see HPI Constitutional:   No-   weight loss, night sweats, fevers, chills, fatigue, lassitude. HEENT:   No-  headaches, difficulty swallowing, tooth/dental problems, sore throat,       No-  sneezing, itching,  ear ache, nasal congestion, post nasal drip,  CV:  No-   chest pain,  orthopnea, PND, swelling in lower extremities, anasarca,  dizziness, palpitations Resp: +   shortness of breath with exertion or at rest.              No-   productive cough,  No non-productive cough,  No- coughing up of blood.              No-   change in color of mucus.  No- wheezing.   Skin: No-   rash or lesions. GI:  No-   heartburn, indigestion, abdominal pain, nausea, vomiting, diarrhea,                 change in bowel habits, loss of appetite GU: No-   dysuria, change in color of urine, no urgency or frequency.  No- flank pain. MS:  No-   joint pain or swelling.  No- decreased range of motion.  No- back pain. Neuro-     nothing unusual Psych:  No- change in mood or affect. No depression or anxiety.  No memory loss.  OBJ- Physical Exam General- Alert, Oriented, Affect-appropriate, Distress- none acute Skin- rash-none, lesions- none, excoriation- none Lymphadenopathy- none Head- atraumatic            Eyes- Gross vision intact, PERRLA, conjunctivae and secretions clear            Ears- Hearing okay, + thick cerumen            Nose- +turbinate edema, no-Septal dev, mucus, polyps, erosion, perforation             Throat- Mallampati III , mucosa clear , drainage- none, tonsils- atrophic, own teeth Neck- flexible , trachea midline, no stridor , thyroid nl, carotid no bruit Chest - symmetrical excursion , unlabored           Heart/CV- RRR , no murmur , no gallop  , no rub, nl s1 s2                           - JVD- none , edema- none, stasis changes- none, varices- none           Lung- clear, wheeze- none, cough- none , dullness-none, rub- none           Chest wall-  Abd- Br/ Gen/ Rectal- Not done, not indicated Extrem- cyanosis- none, clubbing, none, atrophy- none, strength- nl Neuro- grossly intact to observation

## 2014-12-22 NOTE — Assessment & Plan Note (Signed)
He did not tolerate Chantix well because of jitters and difficulty with concentration. Discussed Zyban as an alternative. Plan-change Chantix to Zyban

## 2014-12-22 NOTE — Assessment & Plan Note (Signed)
Not much prospect that bronchodilators will help much. Emphasis on smoking cessation. Plan-sample Anoro for trial

## 2014-12-22 NOTE — Assessment & Plan Note (Signed)
Nodules were discussed. Plan follow-up CT in 6-9 months as discussed.

## 2015-03-17 ENCOUNTER — Encounter: Payer: Self-pay | Admitting: Internal Medicine

## 2015-03-17 ENCOUNTER — Ambulatory Visit (INDEPENDENT_AMBULATORY_CARE_PROVIDER_SITE_OTHER): Payer: BLUE CROSS/BLUE SHIELD | Admitting: Internal Medicine

## 2015-03-17 VITALS — BP 160/80 | HR 92 | Ht 70.0 in | Wt 208.2 lb

## 2015-03-17 DIAGNOSIS — Z720 Tobacco use: Secondary | ICD-10-CM

## 2015-03-17 DIAGNOSIS — J449 Chronic obstructive pulmonary disease, unspecified: Secondary | ICD-10-CM | POA: Diagnosis not present

## 2015-03-17 DIAGNOSIS — R911 Solitary pulmonary nodule: Secondary | ICD-10-CM | POA: Diagnosis not present

## 2015-03-17 MED ORDER — ALBUTEROL SULFATE HFA 108 (90 BASE) MCG/ACT IN AERS: 2.0000 | INHALATION_SPRAY | Freq: Four times a day (QID) | RESPIRATORY_TRACT | Status: DC | PRN

## 2015-03-17 NOTE — Progress Notes (Signed)
05/12/14- 35 yoM 1ppd smoker referred courtesy of Dr Farrel Demark nodule on CXR and CT Chest (Wife =Pam) Complicated by Tobacco use, COPD, CAD, PAD/ claudication, HBP CXR showed LLL basilar density. CT indicates this is scar, but 2 incidental small nodules were noted, needing f/u. Incidental Note Aspirin allergy- each time tries bASA gets sneeze, wheeze and rhinorrhea. No hx nasal polyps or asthma. He has smoked most of his adult life. 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 Remote exposure to metal working fluids but not to cotton dust or asbestos. Denies history of pneumonia or asthma. Has had flu and pneumonia vaccines. Complains ears stopped up-has needed ENT to remove wax in past.  CT chest  03/06/14    Images reviewed with him IMPRESSION: 1. No the area of concern on the recent chest radiograph corresponds to a benign area of post infectious or inflammatory scarring in the lingula. 2. However, there are 2 incidental pulmonary nodules noted in the lungs bilaterally, as above. The largest of these measures 6 x 5 mm in the right upper lobe (image 31 of series 3). Given the obvious smoking related changes in the lungs, the patient is at high risk for bronchogenic carcinoma, and accordingly a follow-up chest CT at 6 months is recommended. This recommendation follows the consensus statement: Guidelines for Management of Small Pulmonary Nodules Detected on CT Scans: A Statement from the [place] as published in Radiology 2005;237:395-400. 3. Diffuse bronchial wall thickening with mild centrilobular and moderate paraseptal emphysema; imaging findings suggestive of underlying COPD. 4. Atherosclerosis, including left main and 3 vessel coronary artery disease. Please note that although the presence of coronary artery calcium documents the presence of coronary artery disease, the severity of this disease and any potential stenosis cannot be assessed on this  non-gated CT examination. In addition, there is mild ectasia of the ascending thoracic aorta (4.0 cm in diameter). Assessment for potential risk factor modification, dietary therapy or pharmacologic therapy may be warranted, if clinically indicated. 5. Calcifications of the aortic valve. 6. Additional incidental findings, as above. Electronically Signed  By: Vinnie Langton M.D.  On: 03/06/2014 08:47  12/02/14-  10 yoM 1ppd smoker followed forlung nodule on CXR and CT Chest,Complicated by Tobacco use, COPD, CAD, PAD/ claudication, HBP (Wife =Pam) Follows for: PFT done today and CT Chest done 11/30/14. Pt states not able to concentrate as well with taking chantix and also has been feeling nervous. Med has helped with cravings and he has been able to cut down to less than 1/2 ppd.  He states that his breathing is unchanged. Chantix has helped cut down smoking but makes him jittery and trouble concentrating. Asks about Zyban which we discussed. We reviewed his PFT and CT scan. PFT 12/02/14-severe obstructive airways disease with insignificant response to bronchodilator, air trapping, moderately reduced diffusion. FEV1/FVC 0.59, RV 139%, TLC 93%, DLCO 62%. CT chest 11/30/14 IMPRESSION: Stable bilateral pulmonary nodules measuring up to 6 mm. Continued followup by CT recommended in 6-9 months. This recommendation follows the consensus statement: Guidelines for Management of Small Pulmonary Nodules Detected on CT Scans: A Statement from the [place] as published in Radiology 2005; 237:395-400. Moderate emphysema. No acute findings. Electronically Signed  By: Earle Gell M.D.  On: 11/30/2014 09:20  03/17/15- 37 yoM 1ppd smoker followed for Lung nodule on CXR and CT Chest, Tobacco use, COPD, complicated by CAD, PAD/ claudication, HBP (Wife =Pam) Follows For: pt states he is doing well, breathing is good right now.  pt states he was having some trouble breathing at night about 3 weeks  ago but is much better now. His sample of Anoro Ellipta didn't last long enough for him to feel he could judge it. Not trying to stop smoking.  ROS-see HPI Constitutional:   No-   weight loss, night sweats, fevers, chills, fatigue, lassitude. HEENT:   No-  headaches, difficulty swallowing, tooth/dental problems, sore throat,       No-  sneezing, itching, ear ache, nasal congestion, post nasal drip,  CV:  No-   chest pain, orthopnea, PND, swelling in lower extremities, anasarca,  dizziness, palpitations Resp: +   shortness of breath with exertion or at rest.              No-   productive cough,  No non-productive cough,  No- coughing up of blood.              No-   change in color of mucus.  No- wheezing.   Skin: No-   rash or lesions. GI:  No-   heartburn, indigestion, abdominal pain, nausea, vomiting GU:  MS:  No-   joint pain or swelling.   Neuro-     nothing unusual Psych:  No- change in mood or affect. No depression or anxiety.  No memory loss.  OBJ- Physical Exam General- Alert, Oriented, Affect-appropriate, Distress- none acute, + odor tobacco Skin- rash-none, lesions- none, excoriation- none Lymphadenopathy- none Head- atraumatic            Eyes- Gross vision intact, PERRLA, conjunctivae and secretions clear            Ears- Hearing okay, + thick cerumen            Nose- +turbinate edema, no-Septal dev, mucus, polyps, erosion, perforation             Throat- Mallampati III , mucosa clear , drainage- none, tonsils- atrophic, own teeth Neck- flexible , trachea midline, no stridor , thyroid nl, carotid no bruit Chest - symmetrical excursion , unlabored           Heart/CV- RRR , no murmur , no gallop  , no rub, nl s1 s2                           - JVD- none , edema- none, stasis changes- none, varices- none           Lung- + coarse breath sounds unlabored, wheeze- none, cough- none , dullness-none, rub- none           Chest wall-  Abd- Br/ Gen/ Rectal- Not done, not  indicated Extrem- cyanosis- none, clubbing, none, atrophy- none, strength- nl Neuro- grossly intact to observation

## 2015-03-17 NOTE — Patient Instructions (Addendum)
Script sent for albuterol rescue inhaler to use only if needed for chest tightness, shortness of breath, wheezing  Order- schedule non contrast CT chest future, to be done in 6 months from now, dx lung nodules  Please call as needed

## 2015-03-20 ENCOUNTER — Other Ambulatory Visit: Payer: Self-pay | Admitting: Internal Medicine

## 2015-03-22 NOTE — Telephone Encounter (Signed)
CY Please advise on refill; I do not see where patient has had Rx filled under your name previously. Thanks.

## 2015-03-22 NOTE — Telephone Encounter (Signed)
I would think he gets this from PCP or cardiology. If he has neither, then we can refill it

## 2015-03-23 NOTE — Assessment & Plan Note (Signed)
Compared maintenance and rescue medications. Prescription albuterol HFA

## 2015-03-23 NOTE — Assessment & Plan Note (Addendum)
Discussed and following radiology recommendations for follow-up CT. We will schedule this after Medicare help is available to him because he has no insurance.

## 2015-03-23 NOTE — Assessment & Plan Note (Signed)
Urgent importance of smoking cessation and available support emphasized again

## 2015-06-04 ENCOUNTER — Inpatient Hospital Stay: Admission: RE | Admit: 2015-06-04 | Payer: BLUE CROSS/BLUE SHIELD | Source: Ambulatory Visit

## 2015-06-16 ENCOUNTER — Encounter: Payer: Self-pay | Admitting: Gastroenterology

## 2015-09-14 ENCOUNTER — Ambulatory Visit: Payer: BLUE CROSS/BLUE SHIELD | Admitting: Internal Medicine

## 2016-01-07 ENCOUNTER — Ambulatory Visit (INDEPENDENT_AMBULATORY_CARE_PROVIDER_SITE_OTHER)
Admission: RE | Admit: 2016-01-07 | Discharge: 2016-01-07 | Disposition: A | Discharge status: Home / Self Care | Payer: Medicare Other | Source: Ambulatory Visit | Attending: Internal Medicine | Admitting: Internal Medicine

## 2016-01-07 DIAGNOSIS — R911 Solitary pulmonary nodule: Secondary | ICD-10-CM

## 2016-01-07 DIAGNOSIS — R918 Other nonspecific abnormal finding of lung field: Secondary | ICD-10-CM | POA: Diagnosis not present

## 2016-01-10 ENCOUNTER — Telehealth: Payer: Self-pay | Admitting: Internal Medicine

## 2016-01-10 NOTE — Progress Notes (Signed)
lmtcb x1 

## 2016-01-11 NOTE — Telephone Encounter (Signed)
Nothing in the chart.  Will close.

## 2016-01-12 ENCOUNTER — Encounter: Payer: Self-pay | Admitting: Internal Medicine

## 2016-01-12 ENCOUNTER — Ambulatory Visit (INDEPENDENT_AMBULATORY_CARE_PROVIDER_SITE_OTHER): Payer: Medicare Other | Admitting: Internal Medicine

## 2016-01-12 VITALS — BP 128/80 | HR 104 | Ht 70.0 in | Wt 204.0 lb

## 2016-01-12 DIAGNOSIS — J449 Chronic obstructive pulmonary disease, unspecified: Secondary | ICD-10-CM | POA: Diagnosis not present

## 2016-01-12 DIAGNOSIS — Z23 Encounter for immunization: Secondary | ICD-10-CM | POA: Diagnosis not present

## 2016-01-12 DIAGNOSIS — Z720 Tobacco use: Secondary | ICD-10-CM

## 2016-01-12 DIAGNOSIS — J3089 Other allergic rhinitis: Secondary | ICD-10-CM

## 2016-01-12 DIAGNOSIS — J309 Allergic rhinitis, unspecified: Secondary | ICD-10-CM | POA: Diagnosis not present

## 2016-01-12 DIAGNOSIS — J302 Other seasonal allergic rhinitis: Secondary | ICD-10-CM

## 2016-01-12 DIAGNOSIS — R911 Solitary pulmonary nodule: Secondary | ICD-10-CM | POA: Diagnosis not present

## 2016-01-12 MED ORDER — ALBUTEROL SULFATE HFA 108 (90 BASE) MCG/ACT IN AERS: INHALATION_SPRAY | RESPIRATORY_TRACT | 12 refills | Status: DC

## 2016-01-12 MED ORDER — TIOTROPIUM BROMIDE-OLODATEROL 2.5-2.5 MCG/ACT IN AERS: 2.0000 | INHALATION_SPRAY | Freq: Every day | RESPIRATORY_TRACT | 0 refills | Status: DC

## 2016-01-12 MED ORDER — TIOTROPIUM BROMIDE-OLODATEROL 2.5-2.5 MCG/ACT IN AERS: 2.0000 | INHALATION_SPRAY | Freq: Every day | RESPIRATORY_TRACT | 12 refills | Status: DC

## 2016-01-12 NOTE — Progress Notes (Signed)
05/12/14- 65 yoM 1ppd smoker referred courtesy of Dr Farrel Demark nodule on CXR and CT Chest (Wife =Pam) Complicated by Tobacco use, COPD, CAD, PAD/ claudication, HBP CXR showed LLL basilar density. CT indicates this is scar, but 2 incidental small nodules were noted, needing f/u. Incidental Note Aspirin allergy- each time tries bASA gets sneeze, wheeze and rhinorrhea. No hx nasal polyps or asthma. He has smoked most of his adult life. 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 Remote exposure to metal working fluids but not to cotton dust or asbestos. Denies history of pneumonia or asthma. Has had flu and pneumonia vaccines. Complains ears stopped up-has needed ENT to remove wax in past. CT chest  03/06/14    Images reviewed with him IMPRESSION: 1. No the area of concern on the recent chest radiograph corresponds to a benign area of post infectious or inflammatory scarring in the lingula. 2. However, there are 2 incidental pulmonary nodules noted in the lungs bilaterally, as above. The largest of these measures 6 x 5 mm in the right upper lobe (image 31 of series 3). Given the obvious smoking related changes in the lungs, the patient is at high risk for bronchogenic carcinoma, and accordingly a follow-up chest CT at 6 months is recommended. This recommendation follows the consensus statement: Guidelines for Management of Small Pulmonary Nodules Detected on CT Scans: A Statement from the [place] as published in Radiology 2005;237:395-400. 3. Diffuse bronchial wall thickening with mild centrilobular and moderate paraseptal emphysema; imaging findings suggestive of underlying COPD. 4. Atherosclerosis, including left main and 3 vessel coronary artery disease. Please note that although the presence of coronary artery calcium documents the presence of coronary artery disease, the severity of this disease and any potential stenosis cannot be assessed on this  non-gated CT examination. In addition, there is mild ectasia of the ascending thoracic aorta (4.0 cm in diameter). Assessment for potential risk factor modification, dietary therapy or pharmacologic therapy may be warranted, if clinically indicated. 5. Calcifications of the aortic valve. 6. Additional incidental findings, as above. Electronically Signed  By: Vinnie Langton M.D.  On: 03/06/2014 08:47  12/02/14-  65 yoM 1ppd smoker followed forlung nodule on CXR and CT Chest,Complicated by Tobacco use, COPD, CAD, PAD/ claudication, HBP (Wife =Pam) Follows for: PFT done today and CT Chest done 11/30/14. Pt states not able to concentrate as well with taking chantix and also has been feeling nervous. Med has helped with cravings and he has been able to cut down to less than 1/2 ppd.  He states that his breathing is unchanged. Chantix has helped cut down smoking but makes him jittery and trouble concentrating. Asks about Zyban which we discussed. We reviewed his PFT and CT scan. PFT 12/02/14-severe obstructive airways disease with insignificant response to bronchodilator, air trapping, moderately reduced diffusion. FEV1/FVC 0.59, RV 139%, TLC 93%, DLCO 62%. CT chest 11/30/14 IMPRESSION: Stable bilateral pulmonary nodules measuring up to 6 mm. Continued followup by CT recommended in 6-9 months. This recommendation follows the consensus statement: Guidelines for Management of Small Pulmonary Nodules Detected on CT Scans: A Statement from the [place] as published in Radiology 2005; 237:395-400. Moderate emphysema. No acute findings. Electronically Signed  By: Earle Gell M.D.  On: 11/30/2014 09:20  03/17/15- 65 yoM 1ppd smoker followed for Lung nodules, Tobacco use, COPD, complicated by CAD, PAD/ claudication, HBP (Wife =Pam) Follows For: pt states he is doing well, breathing is good right now. pt states he was having some  trouble breathing at night about 3 weeks ago but is much  better now. His sample of Anoro Ellipta didn't last long enough for him to feel he could judge it. Not trying to stop smoking.  01/12/2016-65 year old male one pack per day smoker followed for lung nodules, tobacco abuse, COPD, complicated by CAD, PAD/claudication, HBP, aspirin allergy FOLLOWS FOR: Pt states he is doing well overall; denies any more than usual SOB, cough, or congestion at this time. Fall is his worse season. Review CT Chest from 01-07-16 with patient. He continues to smoke and we discussed this again, offering support. He wants to get Prevnar vaccine today and will get flu vaccine at his drugstore in a couple of weeks. Cough produces either scant clear mucus, ordered none. Much postnasal drip. Allegra is some help. He had forgotten about Flonase which helped in the past. CT chest 01/07/2016- reviewed with him. Small stable nodules appear benign. IMPRESSION: 1. Scattered pulmonary nodules are all stable since 03/06/2014 and are considered benign. 2. Moderate centrilobular and paraseptal emphysema with diffuse bronchial wall thickening, suggesting COPD. 3. Aortic atherosclerosis. Left main and 3 vessel coronary atherosclerosis. Electronically Signed   By: Ilona Sorrel M.D.   On: 01/07/2016 10:53  ROS-see HPI Constitutional:   No-   weight loss, night sweats, fevers, chills, fatigue, lassitude. HEENT:   No-  headaches, difficulty swallowing, tooth/dental problems, sore throat,       No-  sneezing, itching, ear ache, nasal congestion, +post nasal drip,  CV:  No-   chest pain, orthopnea, PND, swelling in lower extremities, anasarca,  dizziness, palpitations Resp: +   shortness of breath with exertion or at rest.              No-   productive cough,  + non-productive cough,  No- coughing up of blood.              No-   change in color of mucus.  No- wheezing.   Skin: No-   rash or lesions. GI:  No-   heartburn, indigestion, abdominal pain, nausea, vomiting GU:  MS:  No-    joint pain or swelling.   Neuro-     nothing unusual Psych:  No- change in mood or affect. No depression or anxiety.  No memory loss.  OBJ- Physical Exam General- Alert, Oriented, Affect-appropriate, Distress- none acute,  Skin- rash-none, lesions- none, excoriation- none, + plethoric or mild sunburn Lymphadenopathy- none Head- atraumatic            Eyes- Gross vision intact, PERRLA, conjunctivae and secretions clear            Ears- Hearing okay, +            Nose- +turbinate edema, no-Septal dev, mucus, polyps, erosion, perforation             Throat- Mallampati III , mucosa clear , drainage- none, tonsils- atrophic, own teeth Neck- flexible , trachea midline, no stridor , thyroid nl, carotid no bruit Chest - symmetrical excursion , unlabored           Heart/CV- RRR , no murmur , no gallop  , no rub, nl s1 s2                           - JVD- none , edema- none, stasis changes- none, varices- none           Lung-  unlabored, wheeze- none, cough- none ,  dullness-none, rub- none           Chest wall-  Abd- Br/ Gen/ Rectal- Not done, not indicated Extrem- cyanosis- none, clubbing, none, atrophy- none, strength- nl Neuro- grossly intact to observation

## 2016-01-12 NOTE — Progress Notes (Signed)
Patient seen in the office today and instructed on use of Verdi.  Patient expressed understanding and demonstrated technique.     Patient ID: Brady Crumb Sr., male   DOB: 1950-12-11, 65 y.o.   MRN: [id number]

## 2016-01-12 NOTE — Assessment & Plan Note (Signed)
Benign based on CT chest 01/07/2016. Plan-at least annual chest x-ray.

## 2016-01-12 NOTE — Assessment & Plan Note (Signed)
Encouraged again to stop smoking and discussed availability of Wellbutrin for trial. He had failed to tolerate Chantix. He doesn't choose intervention now.

## 2016-01-12 NOTE — Assessment & Plan Note (Signed)
He feels stable and controlled. We discussed options but don't need to make changes now. Emphasis of course on smoking cessation. Plan-Prevnar 13 today. Wait 2 weeks then can get flu vaccine here or at drugstore.

## 2016-01-12 NOTE — Assessment & Plan Note (Signed)
Mild exacerbation with fall weather coming in. Recognize probable component of tobacco smoke irritant rhinitis as well. Plan-OTC antihistamine such as Allegra. Add Flonase while needed.

## 2016-01-12 NOTE — Patient Instructions (Addendum)
Please make a real effort to stop smoking as discussed, before you end up needing oxygen.  Prevnar 13 pneumonia vaccine  In 2 weeks you can get a flu shot here or at your drug store. Please let us know when you get it, so we can note it in your record.  Prnt script refilling your albuterol rescue inhaler to use if needed  Sample and print script to try Stiolto maintenance inhaler    Inhale 2 puffs, once every day   See if it helps your breathing.

## 2016-03-27 DIAGNOSIS — H6521 Chronic serous otitis media, right ear: Secondary | ICD-10-CM | POA: Diagnosis not present

## 2016-03-27 DIAGNOSIS — H6691 Otitis media, unspecified, right ear: Secondary | ICD-10-CM | POA: Diagnosis not present

## 2016-03-27 DIAGNOSIS — Z23 Encounter for immunization: Secondary | ICD-10-CM | POA: Diagnosis not present

## 2016-03-30 DIAGNOSIS — H6061 Unspecified chronic otitis externa, right ear: Secondary | ICD-10-CM | POA: Diagnosis not present

## 2016-03-30 DIAGNOSIS — H6121 Impacted cerumen, right ear: Secondary | ICD-10-CM | POA: Diagnosis not present

## 2016-04-12 ENCOUNTER — Telehealth: Payer: Self-pay | Admitting: Internal Medicine

## 2016-04-12 MED ORDER — ALBUTEROL SULFATE HFA 108 (90 BASE) MCG/ACT IN AERS: 2.0000 | INHALATION_SPRAY | Freq: Four times a day (QID) | RESPIRATORY_TRACT | 6 refills | Status: DC | PRN

## 2016-04-12 NOTE — Telephone Encounter (Signed)
Changed inhaler to Ventolin and called pt to advise. I spoke with wife and told her Rx at the pharmacy.

## 2016-06-01 IMAGING — CR DG CHEST 2V
2 series · 2 of 2 positions shown · non-contrast
Comparison: October 13, 2003

CLINICAL DATA: Preoperative lower extremity arteriogram; peripheral
artery disease ; coronary artery disease

EXAM:
CHEST  2 VIEW

[w chest pa]
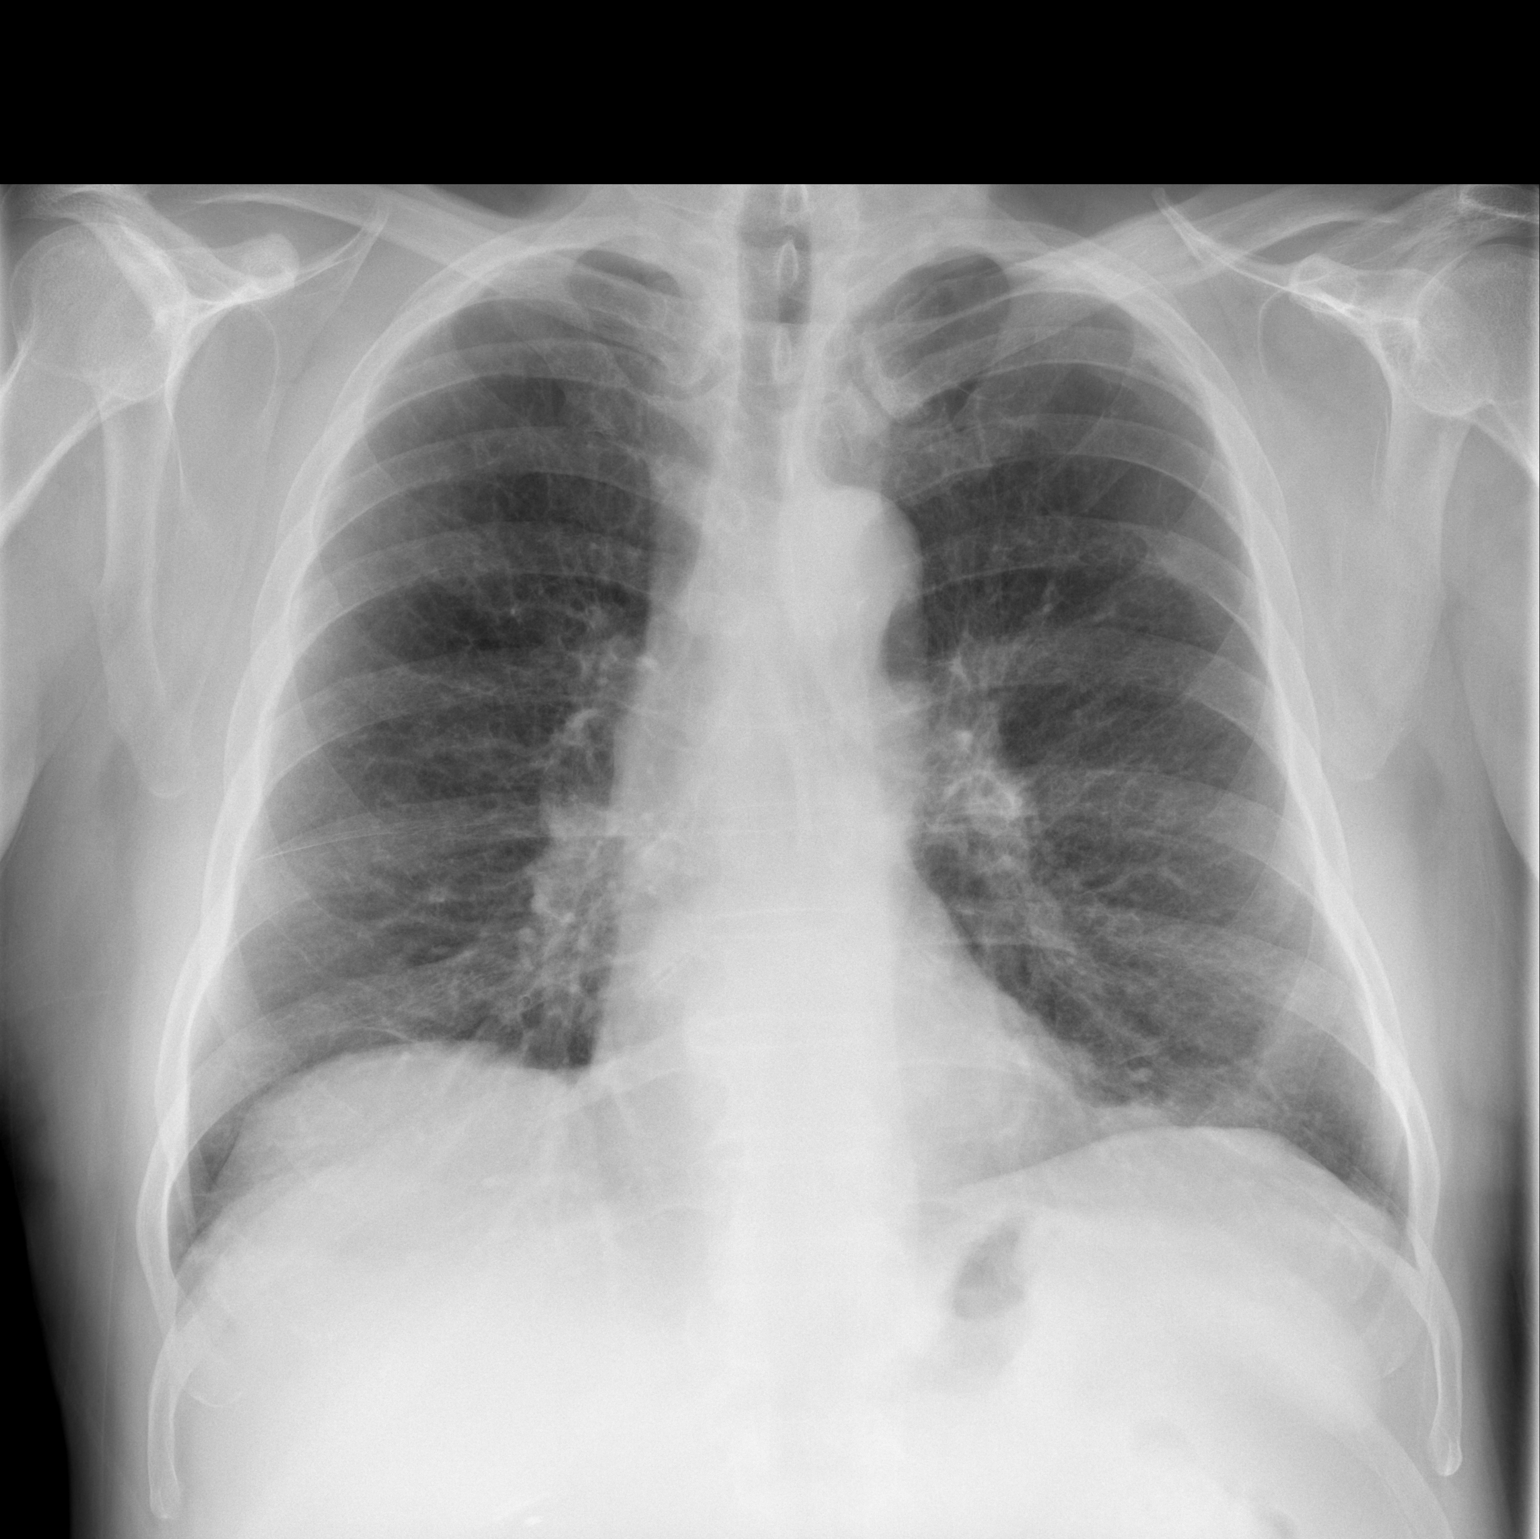

[w chest lat]
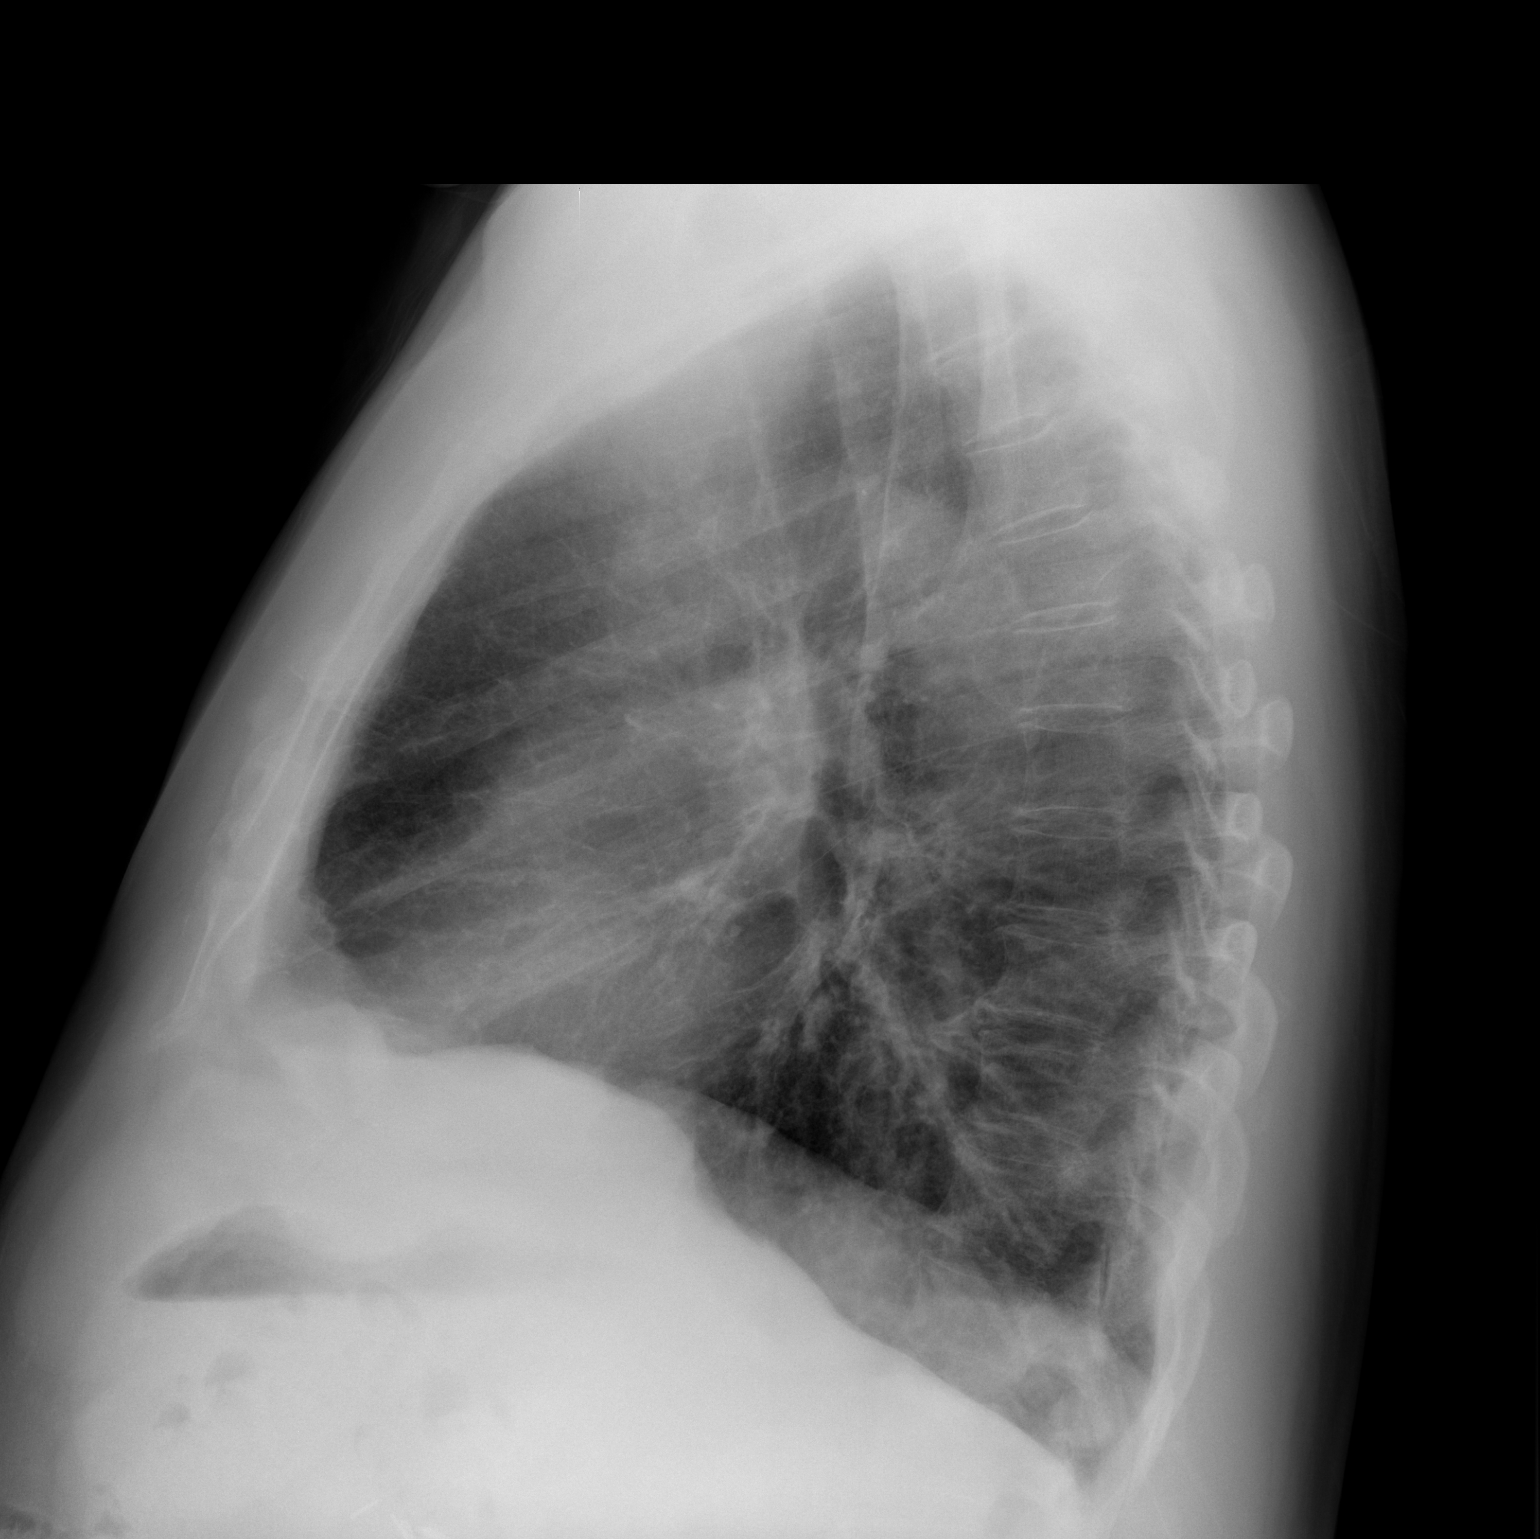

[2 of 2 positions shown; findings below may reference images not displayed]

FINDINGS: There is a 1.9 x 1.1 cm nodular lesion in the left base region near
the left heart border on the frontal view. There is minimal scarring
in the right base. Elsewhere lungs are clear. Heart size and
pulmonary vascularity are normal. No adenopathy. No bone lesions.
IMPRESSION: 1.9 x 1.1 cm nodular opacity in the left base region near the left
heart border on the frontal view. Advise noncontrast enhanced chest
CT to further assess. No edema or consolidation.

These results will be called to the ordering clinician or
representative by the Radiologist Assistant, and communication
documented in the PACS or zVision Dashboard.

## 2016-08-18 ENCOUNTER — Other Ambulatory Visit: Payer: Self-pay | Admitting: Internal Medicine

## 2016-08-18 MED ORDER — ALBUTEROL SULFATE HFA 108 (90 BASE) MCG/ACT IN AERS: 2.0000 | INHALATION_SPRAY | Freq: Four times a day (QID) | RESPIRATORY_TRACT | 3 refills | Status: DC | PRN

## 2016-08-21 ENCOUNTER — Telehealth: Payer: Self-pay | Admitting: Internal Medicine

## 2016-08-21 NOTE — Telephone Encounter (Signed)
Called [facility], Ventolin is the only covered rescue inhaler on pt's insurance plan and this is being dispensed.  Nothing further needed.

## 2016-08-29 ENCOUNTER — Ambulatory Visit (INDEPENDENT_AMBULATORY_CARE_PROVIDER_SITE_OTHER): Payer: Medicare Other | Admitting: Cardiovascular Disease

## 2016-08-29 ENCOUNTER — Encounter: Payer: Self-pay | Admitting: Cardiovascular Disease

## 2016-08-29 VITALS — BP 162/74 | HR 72 | Ht 70.0 in | Wt 204.2 lb

## 2016-08-29 DIAGNOSIS — E785 Hyperlipidemia, unspecified: Secondary | ICD-10-CM

## 2016-08-29 DIAGNOSIS — I10 Essential (primary) hypertension: Secondary | ICD-10-CM | POA: Insufficient documentation

## 2016-08-29 DIAGNOSIS — Z720 Tobacco use: Secondary | ICD-10-CM

## 2016-08-29 DIAGNOSIS — I739 Peripheral vascular disease, unspecified: Secondary | ICD-10-CM | POA: Diagnosis not present

## 2016-08-29 DIAGNOSIS — I2510 Atherosclerotic heart disease of native coronary artery without angina pectoris: Secondary | ICD-10-CM | POA: Diagnosis not present

## 2016-08-29 MED ORDER — LISINOPRIL 5 MG PO TABS: 5.0000 mg | ORAL_TABLET | Freq: Every day | ORAL | 11 refills | Status: DC

## 2016-08-29 MED ORDER — CLOPIDOGREL BISULFATE 75 MG PO TABS: 75.0000 mg | ORAL_TABLET | Freq: Every day | ORAL | 3 refills | Status: DC

## 2016-08-29 MED ORDER — SIMVASTATIN 40 MG PO TABS: 40.0000 mg | ORAL_TABLET | Freq: Every day | ORAL | 11 refills | Status: DC

## 2016-08-29 MED ORDER — SIMVASTATIN 40 MG PO TABS
40.0000 mg | ORAL_TABLET | Freq: Every day | ORAL | 11 refills | Status: DC
Start: 2016-08-29 — End: 2016-08-29

## 2016-08-29 NOTE — Assessment & Plan Note (Signed)
History of peripheral arterial disease status post remote right common iliac artery stenting by myself 12/15/99 with an 8 mm x 2 cm balloon expandable stent. Because of recurrent claudication and decrease in ABI I re-angiogram him 03/16/14 and recanalize the occluded right common iliac artery stent. At that time he had short segment occlusion mid right SFA with 3 vessel runoff, 60% proximal left, likely stenosis with a 20 mm gradient, 80% mid left SFA stenosis with three-vessel runoff. He currently denies claudication. He remains on aspirin and Plavix. We will recheck a lower extremity arterial Doppler study.

## 2016-08-29 NOTE — Assessment & Plan Note (Signed)
History of hyperlipidemia on statin therapy. We will recheck a lipid and liver profile 

## 2016-08-29 NOTE — Assessment & Plan Note (Signed)
History of essential hypertension blood pressure measured today at 162/74. He does measure his blood pressure low at home which is somewhat lower than this although he says in the evenings that it is somewhat elevated. He was on Diovan/hydrochlorothiazide in the past which she has not taken in a while. I'm going to start him on lisinopril 5 mg a day.

## 2016-08-29 NOTE — Progress Notes (Signed)
08/29/2016 Brady Crumb Sr.   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 Durwin Nora Little's and now sees Dr. Claudie Leach. I last saw him in the office 04/03/14. He has a history of CAD status post RCA stenting back in 2005 with a known chronically occluded circumflex and normal LV function. His cardiac risk factor profile is notable for tobacco abuse and treated hyperlipidemia. He denies chest pain or shortness of breath. I stented his right common iliac artery 12/15/99 with a balloon-expandable stent (8 mm x 2 cm). He had excellent angiographic and clinical results. Over the last 2-3 years she's had progressive claudication in his right hip buttock and leg. Recent workup performed by Dr. Claudie Leach revealed a right ABI of 0.43 with what appeared to be an occluded right common iliac and SFA. A CT scan confirmed iliac occlusion. Since I saw him in the office 01/22/14 he had arterial Doppler studies performed 01/30/14 revealing a right ABI of 0.31 with an occluded right common iliac and SFA. His left ABI was 25 with a high frequency signal in his distal left SFA. He is symptomatic on the right with Rutherford class IV claudication. He also had a nodule on his preoperative chest x-ray which was confirmed to be a 5 x 6 mm millimeter right upper lobe nodule by CT scanning suspicious for malignancy and patient with a history of tobacco abuse. He has an appointment to see Dr. Keturah Barre next month for further evaluation of this. I performed angiography on him 03/16/14 and was able to percutaneously recanalize his right common iliac artery chronic total occlusion and placed a 7 mm x 30 mm long ICast   covered stent. His right ABI improved from .31-.54 and his right hip claudication has completely resolved. Since I saw him to half years ago he's remained stable. Does continue to smoke a pack a day. He denies chest pain, shortness of breath or claudication.    Current Outpatient Prescriptions  Medication Sig Dispense Refill  . albuterol (PROVENTIL HFA;VENTOLIN HFA) 108 (90 Base) MCG/ACT inhaler Inhale 2 puffs into the lungs every 6 (six) hours as needed for wheezing or shortness of breath. 3 Inhaler 3  . buPROPion (ZYBAN) 150 MG 12 hr tablet Take 1 tablet (150 mg total) by mouth 2 (two) times daily. 60 tablet 5  . clopidogrel (PLAVIX) 75 MG tablet Take 75 mg by mouth daily.     . fexofenadine (ALLEGRA) 180 MG tablet Take 180 mg by mouth daily. Pt has been taking as needed    . simvastatin (ZOCOR) 40 MG tablet Take 40 mg by mouth daily.    . Tiotropium Bromide-Olodaterol (STIOLTO RESPIMAT) 2.5-2.5 MCG/ACT AERS Inhale 2 puffs into the lungs daily. 1 Inhaler 12   No current facility-administered medications for this visit.     Allergies  Allergen Reactions  . Aspirin Other (See Comments)    Sneezes, watery nose, wheeze (no polyps)   . Contrast Media [Iodinated Diagnostic Agents]     Need to be pre-medicated  . Erythromycin     All mycin drugs  . Penicillins   . Sulfa Antibiotics     Social History   Social History  .  Marital status: Married    Spouse name: Brady Brooks  . Number of children: 2  . Years of education: N/A   Occupational History  . textile manf    Social History Main Topics  . Smoking status: Current Every Day Smoker    Packs/day: 1.00    Years: 30.00    Types: Cigarettes  . Smokeless tobacco: Never Used  . Alcohol use No  . Drug use: No  . Sexual activity: Not on file   Other Topics Concern  . Not on file   Social History Narrative  . No narrative on file     Review of Systems: General: negative for chills, fever, night sweats or weight changes.    Cardiovascular: negative for chest pain, dyspnea on exertion, edema, orthopnea, palpitations, paroxysmal nocturnal dyspnea or shortness of breath Dermatological: negative for rash Respiratory: negative for cough or wheezing Urologic: negative for hematuria Abdominal: negative for nausea, vomiting, diarrhea, bright red blood per rectum, melena, or hematemesis Neurologic: negative for visual changes, syncope, or dizziness All other systems reviewed and are otherwise negative except as noted above.    Blood pressure (!) 162/74, pulse 72, height 5\' 10"  (1.778 m), weight 204 lb 3.2 oz (92.6 kg).  General appearance: alert and no distress Neck: no adenopathy, no carotid bruit, no JVD, supple, symmetrical, trachea midline and thyroid not enlarged, symmetric, no tenderness/mass/nodules Lungs: clear to auscultation bilaterally Heart: regular rate and rhythm, S1, S2 normal, no murmur, click, rub or gallop Extremities: extremities normal, atraumatic, no cyanosis or edema  EKG sinus rhythm at 72 without ST or T-wave changes. I personally reviewed this EKG.  ASSESSMENT AND PLAN:   Peripheral arterial disease History of peripheral arterial disease status post remote right common iliac artery stenting by myself 12/15/99 with an 8 mm x 2 cm balloon expandable stent. Because of recurrent claudication and decrease in ABI I re-angiogram him 03/16/14 and recanalize the occluded right common iliac artery stent. At that time he had short segment occlusion mid right SFA with 3 vessel runoff, 60% proximal left, likely stenosis with a 20 mm gradient, 80% mid left SFA stenosis with three-vessel runoff. He currently denies claudication. He remains on aspirin and Plavix. We will recheck a lower extremity arterial Doppler study.  Coronary artery disease History of CAD status post RCA stenting back in 2005 with a known chronically occluded circumflex and normal LV function. He denies chest pain or shortness of  breath.  Hyperlipidemia History of hyperlipidemia on statin therapy. We will recheck a lipid and liver profile  Essential hypertension History of essential hypertension blood pressure measured today at 162/74. He does measure his blood pressure low at home which is somewhat lower than this although he says in the evenings that it is somewhat elevated. He was on Diovan/hydrochlorothiazide in the past which she has not taken in a while. I'm going to start him on lisinopril 5 mg a day.  Tobacco abuse History of continued tobacco abuse of one pack per day recalcitrant risk factor modification.      [name] MD FACP,FACC,FAHA, [facility] 08/29/2016 10:54 AM

## 2016-08-29 NOTE — Assessment & Plan Note (Signed)
History of CAD status post RCA stenting back in 2005 with a known chronically occluded circumflex and normal LV function. He denies chest pain or shortness of breath.

## 2016-08-29 NOTE — Assessment & Plan Note (Signed)
History of continued tobacco abuse of one pack per day recalcitrant risk factor modification 

## 2016-08-29 NOTE — Patient Instructions (Addendum)
Medication Instructions:   START lisinopril 5mg  DAILY Rx has been sent to [facility] pharmacy along w renewal of your Zocor Plavix prescription sent to [facility] mail order  Labwork:  Fasting labs this week   Testing/Procedures:  Your physician has requested that you have a lower or upper extremity arterial duplex. This test is an ultrasound of the arteries in the legs or arms. It looks at arterial blood flow in the legs and arms. Allow one hour for Lower and Upper Arterial scans. There are no restrictions or special instructions    Follow-Up: 1 yr with Dr. Gwenlyn Found     If you need a refill on your cardiac medications before your next appointment, please call your pharmacy.

## 2016-09-12 ENCOUNTER — Other Ambulatory Visit: Payer: Self-pay | Admitting: Cardiovascular Disease

## 2016-09-12 DIAGNOSIS — I2510 Atherosclerotic heart disease of native coronary artery without angina pectoris: Secondary | ICD-10-CM | POA: Diagnosis not present

## 2016-09-12 DIAGNOSIS — I739 Peripheral vascular disease, unspecified: Secondary | ICD-10-CM

## 2016-09-12 DIAGNOSIS — E785 Hyperlipidemia, unspecified: Secondary | ICD-10-CM | POA: Diagnosis not present

## 2016-09-12 LAB — CBC
HEMATOCRIT: 49.2 % (ref 38.5–50.0)
Hemoglobin: 16.5 g/dL (ref 13.2–17.1)
MCH: 33.3 pg — AB (ref 27.0–33.0)
MCHC: 33.5 g/dL (ref 32.0–36.0)
MCV: 99.2 fL (ref 80.0–100.0)
MPV: 11.3 fL (ref 7.5–12.5)
PLATELETS: 197 10*3/uL (ref 140–400)
RBC: 4.96 MIL/uL (ref 4.20–5.80)
RDW: 13.2 % (ref 11.0–15.0)
WBC: 10.2 10*3/uL (ref 3.8–10.8)

## 2016-09-12 LAB — BASIC METABOLIC PANEL
BUN: 13 mg/dL (ref 7–25)
CO2: 25 mmol/L (ref 20–31)
CREATININE: 0.77 mg/dL (ref 0.70–1.25)
Calcium: 8.6 mg/dL (ref 8.6–10.3)
Chloride: 107 mmol/L (ref 98–110)
Glucose, Bld: 135 mg/dL — ABNORMAL HIGH (ref 65–99)
Potassium: 4.5 mmol/L (ref 3.5–5.3)
Sodium: 140 mmol/L (ref 135–146)

## 2016-09-12 LAB — LIPID PANEL
CHOL/HDL RATIO: 5.5 ratio — AB (ref <5.0)
CHOLESTEROL: 186 mg/dL (ref <200)
HDL: 34 mg/dL — ABNORMAL LOW (ref >40)
LDL Cholesterol: 106 mg/dL — ABNORMAL HIGH (ref <100)
TRIGLYCERIDES: 229 mg/dL — AB (ref <150)
VLDL: 46 mg/dL — AB (ref <30)

## 2016-09-12 LAB — HEPATIC FUNCTION PANEL
ALBUMIN: 4 g/dL (ref 3.6–5.1)
ALT: 20 U/L (ref 9–46)
AST: 15 U/L (ref 10–35)
Alkaline Phosphatase: 62 U/L (ref 40–115)
Bilirubin, Direct: 0.1 mg/dL (ref <=0.2)
Indirect Bilirubin: 0.3 mg/dL (ref 0.2–1.2)
Total Bilirubin: 0.4 mg/dL (ref 0.2–1.2)
Total Protein: 6.4 g/dL (ref 6.1–8.1)

## 2016-09-12 LAB — T4, FREE: Free T4: 1.1 ng/dL (ref 0.8–1.8)

## 2016-09-12 LAB — TSH: TSH: 0.87 m[IU]/L (ref 0.40–4.50)

## 2016-09-12 LAB — PSA: PSA: 2.2 ng/mL (ref <=4.0)

## 2016-09-14 ENCOUNTER — Ambulatory Visit (HOSPITAL_COMMUNITY)
Admission: RE | Admit: 2016-09-14 | Discharge: 2016-09-14 | Disposition: A | Discharge status: Home / Self Care | Payer: Medicare Other | Source: Ambulatory Visit | Attending: Cardiology | Admitting: Cardiology

## 2016-09-14 ENCOUNTER — Other Ambulatory Visit: Payer: Self-pay | Admitting: Cardiovascular Disease

## 2016-09-14 DIAGNOSIS — I739 Peripheral vascular disease, unspecified: Secondary | ICD-10-CM | POA: Diagnosis not present

## 2016-09-15 ENCOUNTER — Telehealth: Payer: Self-pay | Admitting: Cardiovascular Disease

## 2016-09-15 DIAGNOSIS — E785 Hyperlipidemia, unspecified: Secondary | ICD-10-CM

## 2016-09-15 MED ORDER — EZETIMIBE 10 MG PO TABS: 10.0000 mg | ORAL_TABLET | Freq: Every day | ORAL | 11 refills | Status: DC

## 2016-09-15 NOTE — Telephone Encounter (Signed)
Patient called and aware of labs. He was informed that Dr. Gwenlyn Found would like for him to start Zetia 10 mg daily. Per the patient, it has been called into the [facility] at [place]. The lab orders will be mailed to him for him to have drawn in two months. He verbalized his understanding.

## 2016-09-15 NOTE — Telephone Encounter (Signed)
Follow up     Pt is returning [place] call

## 2016-09-15 NOTE — Telephone Encounter (Signed)
Left message to call back about results   Per Dr. Gwenlyn Found: Labs okay except lipid profile not at goal for secondary prevention. As Zetia 10 mg a day and recheck in 2 months

## 2016-09-15 NOTE — Telephone Encounter (Signed)
°  Follow Up  Pt returning call from earlier regarding results. Please call.

## 2016-09-20 ENCOUNTER — Telehealth: Payer: Self-pay | Admitting: Cardiovascular Disease

## 2016-09-20 NOTE — Telephone Encounter (Signed)
Called patient and left VM to call back and schedule followup with Dr. Gwenlyn Found.

## 2016-09-28 ENCOUNTER — Other Ambulatory Visit: Payer: Self-pay | Admitting: Internal Medicine

## 2016-09-28 ENCOUNTER — Encounter: Payer: Self-pay | Admitting: Cardiovascular Disease

## 2016-09-28 ENCOUNTER — Ambulatory Visit (INDEPENDENT_AMBULATORY_CARE_PROVIDER_SITE_OTHER): Payer: Medicare Other | Admitting: Cardiovascular Disease

## 2016-09-28 VITALS — BP 148/80 | HR 90 | Ht 70.0 in | Wt 209.0 lb

## 2016-09-28 DIAGNOSIS — I10 Essential (primary) hypertension: Secondary | ICD-10-CM | POA: Diagnosis not present

## 2016-09-28 DIAGNOSIS — I739 Peripheral vascular disease, unspecified: Secondary | ICD-10-CM

## 2016-09-28 DIAGNOSIS — I2510 Atherosclerotic heart disease of native coronary artery without angina pectoris: Secondary | ICD-10-CM | POA: Diagnosis not present

## 2016-09-28 NOTE — Patient Instructions (Signed)
   [place] [address] [place] [address] Dept: [phone] Loc: [phone]  ARCADIO COPE Sr.  09/28/2016  You are scheduled for a Peripheral Angiogram on Monday, July 2 with Dr. [name].  1. Please arrive at the [facility] (Main Entrance A) at [facility]: [address] [place], [place] [address] at 5:30 AM (two hours before your procedure to ensure your preparation). Free valet parking service is available.   Special note: Every effort is made to have your procedure done on time. Please understand that emergencies sometimes delay scheduled procedures.  2. Diet: Do not eat or drink anything after midnight prior to your procedure except sips of water to take medications.  3. Labs: You will need to have blood drawn on Monday, June 25 at [place] [facility].  Open: 8am - 4:45pm (Lunch 12:30 - 1:30). You do not need to be fasting.  4. Medication instructions in preparation for your procedure:   On the morning of your procedure, take your Plavix/Clopidogrel and any morning medicines NOT listed above.  You may use sips of water.  5. Plan for one night stay--bring personal belongings. 6. Bring a current list of your medications and current insurance cards. 7. You MUST have a responsible person to drive you home. 8. Someone MUST be with you the first 24 hours after you arrive home or your discharge will be delayed. 9. Please wear clothes that are easy to get on and off and wear slip-on shoes.  Thank you for allowing [place] to care for you!   -- [facility]

## 2016-09-28 NOTE — Progress Notes (Signed)
09/28/2016 Brady Crumb Sr.   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 Durwin Nora Little's and now sees Dr. Claudie Leach. I last saw him in the office 08/29/16. He has a history of CAD status post RCA stenting back in 2005 with a known chronically occluded circumflex and normal LV function. His cardiac risk factor profile is notable for tobacco abuse and treated hyperlipidemia. He denies chest pain or shortness of breath. I stented his right common iliac artery 12/15/99 with a balloon-expandable stent (8 mm x 2 cm). He had excellent angiographic and clinical results. Over the last 2-3 years she's had progressive claudication in his right hip buttock and leg. Recent workup performed by Dr. Claudie Leach revealed a right ABI of 0.43 with what appeared to be an occluded right common iliac and SFA. A CT scan confirmed iliac occlusion. Since I saw him in the office 01/22/14 he had arterial Doppler studies performed 01/30/14 revealing a right ABI of 0.31 with an occluded right common iliac and SFA. His left ABI was 25 with a high frequency signal in his distal left SFA. He is symptomatic on the right with Rutherford class IV claudication. He also had a nodule on his preoperative chest x-ray which was confirmed to be a 5 x 6 mm millimeter right upper lobe nodule by CT scanning suspicious for malignancy and patient with a history of tobacco abuse. He has an appointment to see Dr. Keturah Barre next month for further evaluation of this. 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 His right ABI improved from .31-.54 and his right hip claudication has completely resolved. Since I saw him to half years ago he's remained stable. Does continue to smoke a pack a day. He denies chest pain, shortness of breath But does complain of bilateral lower extremity lifestyle including claudication. His Dopplers performed 5/70/18 to decline in his Left ABI From 0.76- 0. 59 with a newly occluded left SFA.   Current Outpatient Prescriptions  Medication Sig Dispense Refill  . albuterol (PROVENTIL HFA;VENTOLIN HFA) 108 (90 Base) MCG/ACT inhaler Inhale 2 puffs into the lungs every 6 (six) hours as needed for wheezing or shortness of breath. 3 Inhaler 3  . buPROPion (ZYBAN) 150 MG 12 hr tablet Take 1 tablet (150 mg total) by mouth 2 (two) times daily. 60 tablet 5  . clopidogrel (PLAVIX) 75 MG tablet Take 1 tablet (75 mg total) by mouth daily. 90 tablet 3  . ezetimibe (ZETIA) 10 MG tablet Take 1 tablet (10 mg total) by mouth daily. 30 tablet 11  . fexofenadine (ALLEGRA) 180 MG tablet Take 180 mg by mouth daily. Pt has been taking as needed    . lisinopril (PRINIVIL,ZESTRIL) 5 MG tablet Take 1 tablet (5 mg total) by mouth daily. 30 tablet 11  . simvastatin (ZOCOR) 40 MG tablet Take 1 tablet (40 mg total) by mouth daily. 30 tablet 11  . Tiotropium Bromide-Olodaterol (STIOLTO RESPIMAT) 2.5-2.5 MCG/ACT AERS Inhale 2 puffs into the lungs daily. 1 Inhaler 12   No current facility-administered  medications for this visit.     Allergies  Allergen Reactions  . Aspirin Other (See Comments)    Sneezes, watery nose, wheeze (no polyps)   . Contrast Media [Iodinated Diagnostic Agents]     Need to be pre-medicated  . Erythromycin     All mycin drugs  . Penicillins   . Sulfa Antibiotics     Social History   Social History  . Marital status: Married    Spouse name: Brady Brooks  . Number of children: 2  . Years of education: N/A   Occupational History  . textile manf    Social History  Main Topics  . Smoking status: Current Every Day Smoker    Packs/day: 1.00    Years: 30.00    Types: Cigarettes  . Smokeless tobacco: Never Used  . Alcohol use No  . Drug use: No  . Sexual activity: Not on file   Other Topics Concern  . Not on file   Social History Narrative  . No narrative on file     Review of Systems: General: negative for chills, fever, night sweats or weight changes.  Cardiovascular: negative for chest pain, dyspnea on exertion, edema, orthopnea, palpitations, paroxysmal nocturnal dyspnea or shortness of breath Dermatological: negative for rash Respiratory: negative for cough or wheezing Urologic: negative for hematuria Abdominal: negative for nausea, vomiting, diarrhea, bright red blood per rectum, melena, or hematemesis Neurologic: negative for visual changes, syncope, or dizziness All other systems reviewed and are otherwise negative except as noted above.    Blood pressure (!) 148/80, pulse 90, height 5\' 10"  (1.778 m), weight 209 lb (94.8 kg).  General appearance: alert and no distress Neck: no adenopathy, no carotid bruit, no JVD, supple, symmetrical, trachea midline and thyroid not enlarged, symmetric, no tenderness/mass/nodules Lungs: clear to auscultation bilaterally Heart: regular rate and rhythm, S1, S2 normal, no murmur, click, rub or gallop Extremities: extremities normal, atraumatic, no cyanosis or edema  EKG Not performed today  ASSESSMENT AND PLAN:   Peripheral arterial disease Mr. Helfman returns today for follow-up of his Doppler studies. They revealed a right ABI of 0.5-11.59. Left ABI is reduced from 0.76 performed 10/01/14. His distal left SFA has since occluded. He does have bilateral SI limiting lower extremity claudication. He wishes to proceed with angiography and potential intervention. We will address the right side first sometime in July and states his left leg after that      [name] MD [facility],  [facility] 09/28/2016 3:33 PM

## 2016-09-28 NOTE — Assessment & Plan Note (Signed)
Mr. Bricco returns today for follow-up of his Doppler studies. They revealed a right ABI of 0.5-11.59. Left ABI is reduced from 0.76 performed 10/01/14. His distal left SFA has since occluded. He does have bilateral SI limiting lower extremity claudication. He wishes to proceed with angiography and potential intervention. We will address the right side first sometime in July and states his left leg after that

## 2016-09-29 NOTE — Telephone Encounter (Signed)
CY please advise if ok to refill this medication.  Thanks  Last seen---01/12/16 No pending appts with CY  Last refill given 12/02/14   For #60 with 5 refills.

## 2016-09-29 NOTE — Telephone Encounter (Signed)
Ok to refill 6 months 

## 2016-09-29 NOTE — Telephone Encounter (Signed)
Called [facility] [place] Refill given for Bupropion 150mg , #60 x 5 refills.  Nothing further needed.

## 2016-10-05 ENCOUNTER — Other Ambulatory Visit: Payer: Self-pay | Admitting: Internal Medicine

## 2016-10-05 MED ORDER — BUPROPION HCL ER (SR) 150 MG PO TB12: 150.0000 mg | ORAL_TABLET | Freq: Two times a day (BID) | ORAL | 3 refills | Status: DC

## 2016-10-24 ENCOUNTER — Ambulatory Visit
Admission: RE | Admit: 2016-10-24 | Discharge: 2016-10-24 | Disposition: A | Discharge status: Home / Self Care | Payer: Medicare Other | Source: Ambulatory Visit | Attending: Cardiovascular Disease | Admitting: Cardiovascular Disease

## 2016-10-24 DIAGNOSIS — I10 Essential (primary) hypertension: Secondary | ICD-10-CM | POA: Diagnosis not present

## 2016-10-24 DIAGNOSIS — I2510 Atherosclerotic heart disease of native coronary artery without angina pectoris: Secondary | ICD-10-CM | POA: Diagnosis not present

## 2016-10-24 DIAGNOSIS — I739 Peripheral vascular disease, unspecified: Secondary | ICD-10-CM | POA: Diagnosis not present

## 2016-10-24 DIAGNOSIS — Z01818 Encounter for other preprocedural examination: Secondary | ICD-10-CM | POA: Diagnosis not present

## 2016-10-25 LAB — CBC WITH DIFFERENTIAL/PLATELET
BASOS ABS: 0 10*3/uL (ref 0.0–0.2)
BASOS: 0 %
EOS (ABSOLUTE): 0.2 10*3/uL (ref 0.0–0.4)
Eos: 2 %
HEMATOCRIT: 47.6 % (ref 37.5–51.0)
HEMOGLOBIN: 16.6 g/dL (ref 13.0–17.7)
IMMATURE GRANS (ABS): 0.1 10*3/uL (ref 0.0–0.1)
Immature Granulocytes: 1 %
LYMPHS ABS: 1.6 10*3/uL (ref 0.7–3.1)
Lymphs: 15 %
MCH: 34.4 pg — AB (ref 26.6–33.0)
MCHC: 34.9 g/dL (ref 31.5–35.7)
MCV: 99 fL — ABNORMAL HIGH (ref 79–97)
Monocytes Absolute: 1.2 10*3/uL — ABNORMAL HIGH (ref 0.1–0.9)
Monocytes: 12 %
NEUTROS ABS: 7.5 10*3/uL — AB (ref 1.4–7.0)
Neutrophils: 70 %
Platelets: 224 10*3/uL (ref 150–379)
RBC: 4.82 x10E6/uL (ref 4.14–5.80)
RDW: 13.2 % (ref 12.3–15.4)
WBC: 10.7 10*3/uL (ref 3.4–10.8)

## 2016-10-25 LAB — APTT: aPTT: 29 s (ref 24–33)

## 2016-10-25 LAB — BASIC METABOLIC PANEL
BUN/Creatinine Ratio: 14 (ref 10–24)
BUN: 11 mg/dL (ref 8–27)
CALCIUM: 8.7 mg/dL (ref 8.6–10.2)
CO2: 20 mmol/L (ref 20–29)
Chloride: 103 mmol/L (ref 96–106)
Creatinine, Ser: 0.77 mg/dL (ref 0.76–1.27)
GFR calc Af Amer: 110 mL/min/{1.73_m2} (ref >59)
GFR calc non Af Amer: 95 mL/min/{1.73_m2} (ref >59)
GLUCOSE: 123 mg/dL — AB (ref 65–99)
POTASSIUM: 4.6 mmol/L (ref 3.5–5.2)
SODIUM: 139 mmol/L (ref 134–144)

## 2016-10-25 LAB — PROTIME-INR
INR: 1 (ref 0.8–1.2)
PROTHROMBIN TIME: 11 s (ref 9.1–12.0)

## 2016-10-27 ENCOUNTER — Other Ambulatory Visit: Payer: Self-pay | Admitting: Cardiovascular Disease

## 2016-10-27 ENCOUNTER — Telehealth: Payer: Self-pay | Admitting: Cardiovascular Disease

## 2016-10-27 DIAGNOSIS — I739 Peripheral vascular disease, unspecified: Secondary | ICD-10-CM

## 2016-10-27 NOTE — Telephone Encounter (Signed)
New message    Pt is returning call to [name] about results

## 2016-10-30 ENCOUNTER — Encounter (HOSPITAL_COMMUNITY)
Admission: RE | Disposition: A | Discharge status: Home / Self Care | Payer: Self-pay | Source: Ambulatory Visit | Attending: Cardiovascular Disease

## 2016-10-30 ENCOUNTER — Encounter (HOSPITAL_COMMUNITY): Payer: Self-pay | Admitting: Cardiovascular Disease

## 2016-10-30 ENCOUNTER — Ambulatory Visit (HOSPITAL_COMMUNITY)
Admission: RE | Admit: 2016-10-30 | Discharge: 2016-10-31 | Disposition: A | Discharge status: Home / Self Care | Payer: Medicare Other | Source: Ambulatory Visit | Attending: Cardiovascular Disease | Admitting: Cardiovascular Disease

## 2016-10-30 DIAGNOSIS — Z955 Presence of coronary angioplasty implant and graft: Secondary | ICD-10-CM | POA: Insufficient documentation

## 2016-10-30 DIAGNOSIS — E663 Overweight: Secondary | ICD-10-CM | POA: Insufficient documentation

## 2016-10-30 DIAGNOSIS — I2510 Atherosclerotic heart disease of native coronary artery without angina pectoris: Secondary | ICD-10-CM | POA: Diagnosis not present

## 2016-10-30 DIAGNOSIS — Z7902 Long term (current) use of antithrombotics/antiplatelets: Secondary | ICD-10-CM | POA: Diagnosis not present

## 2016-10-30 DIAGNOSIS — I70213 Atherosclerosis of native arteries of extremities with intermittent claudication, bilateral legs: Secondary | ICD-10-CM | POA: Diagnosis not present

## 2016-10-30 DIAGNOSIS — I739 Peripheral vascular disease, unspecified: Secondary | ICD-10-CM

## 2016-10-30 DIAGNOSIS — I70212 Atherosclerosis of native arteries of extremities with intermittent claudication, left leg: Secondary | ICD-10-CM | POA: Diagnosis not present

## 2016-10-30 DIAGNOSIS — I700 Atherosclerosis of aorta: Secondary | ICD-10-CM | POA: Diagnosis not present

## 2016-10-30 DIAGNOSIS — F17210 Nicotine dependence, cigarettes, uncomplicated: Secondary | ICD-10-CM | POA: Insufficient documentation

## 2016-10-30 DIAGNOSIS — Z91041 Radiographic dye allergy status: Secondary | ICD-10-CM | POA: Insufficient documentation

## 2016-10-30 DIAGNOSIS — I7092 Chronic total occlusion of artery of the extremities: Secondary | ICD-10-CM | POA: Insufficient documentation

## 2016-10-30 DIAGNOSIS — I2582 Chronic total occlusion of coronary artery: Secondary | ICD-10-CM | POA: Insufficient documentation

## 2016-10-30 DIAGNOSIS — Z880 Allergy status to penicillin: Secondary | ICD-10-CM | POA: Insufficient documentation

## 2016-10-30 DIAGNOSIS — Z6829 Body mass index (BMI) 29.0-29.9, adult: Secondary | ICD-10-CM | POA: Insufficient documentation

## 2016-10-30 DIAGNOSIS — Z882 Allergy status to sulfonamides status: Secondary | ICD-10-CM | POA: Insufficient documentation

## 2016-10-30 DIAGNOSIS — E785 Hyperlipidemia, unspecified: Secondary | ICD-10-CM | POA: Diagnosis not present

## 2016-10-30 HISTORY — PX: LOWER EXTREMITY ANGIOGRAPHY: CATH118251

## 2016-10-30 HISTORY — PX: PERIPHERAL VASCULAR INTERVENTION: CATH118257

## 2016-10-30 HISTORY — DX: Emphysema, unspecified: J43.9

## 2016-10-30 LAB — POCT ACTIVATED CLOTTING TIME
ACTIVATED CLOTTING TIME: 230 s
ACTIVATED CLOTTING TIME: 197 s
Activated Clotting Time: 263 seconds
Activated Clotting Time: 164 seconds

## 2016-10-30 SURGERY — LOWER EXTREMITY ANGIOGRAPHY
Anesthesia: LOCAL

## 2016-10-30 MED ORDER — COQ10 100 MG PO CAPS: 1.0000 | ORAL_CAPSULE | Freq: Every day | ORAL | Status: DC

## 2016-10-30 MED ORDER — ONDANSETRON HCL 4 MG/2ML IJ SOLN: 4.0000 mg | Freq: Four times a day (QID) | INTRAMUSCULAR | Status: DC | PRN

## 2016-10-30 MED ORDER — SIMVASTATIN 20 MG PO TABS
40.0000 mg | ORAL_TABLET | Freq: Every day | ORAL | Status: DC
  Administered 2016-10-31: 09:00:00 40 mg via ORAL
  Filled 2016-10-30: qty 2

## 2016-10-30 MED ORDER — SODIUM CHLORIDE 0.9% FLUSH: 3.0000 mL | INTRAVENOUS | Status: DC | PRN

## 2016-10-30 MED ORDER — ANGIOPLASTY BOOK
Freq: Once | Status: DC
  Filled 2016-10-30: qty 1

## 2016-10-30 MED ORDER — LISINOPRIL 5 MG PO TABS
5.0000 mg | ORAL_TABLET | Freq: Every day | ORAL | Status: DC
  Administered 2016-10-31: 5 mg via ORAL
  Filled 2016-10-30: qty 1

## 2016-10-30 MED ORDER — IODIXANOL 320 MG/ML IV SOLN
INTRAVENOUS | Status: DC | PRN
Start: 2016-10-30 — End: 2016-10-30
  Administered 2016-10-30: 220 mL via INTRAVENOUS

## 2016-10-30 MED ORDER — FAMOTIDINE IN NACL 20-0.9 MG/50ML-% IV SOLN
INTRAVENOUS | Status: AC
  Filled 2016-10-30: qty 50

## 2016-10-30 MED ORDER — ALBUTEROL SULFATE (2.5 MG/3ML) 0.083% IN NEBU: 3.0000 mL | INHALATION_SOLUTION | Freq: Four times a day (QID) | RESPIRATORY_TRACT | Status: DC | PRN

## 2016-10-30 MED ORDER — MIDAZOLAM HCL 2 MG/2ML IJ SOLN
INTRAMUSCULAR | Status: AC
  Filled 2016-10-30: qty 2

## 2016-10-30 MED ORDER — ACETAMINOPHEN 325 MG PO TABS: 650.0000 mg | ORAL_TABLET | ORAL | Status: DC | PRN

## 2016-10-30 MED ORDER — MORPHINE SULFATE (PF) 4 MG/ML IV SOLN: 2.0000 mg | INTRAVENOUS | Status: DC | PRN

## 2016-10-30 MED ORDER — LIDOCAINE HCL (PF) 1 % IJ SOLN
INTRAMUSCULAR | Status: DC | PRN
  Administered 2016-10-30: 15 mL

## 2016-10-30 MED ORDER — FAMOTIDINE IN NACL 20-0.9 MG/50ML-% IV SOLN
20.0000 mg | Freq: Once | INTRAVENOUS | Status: AC
  Administered 2016-10-30: 20 mg via INTRAVENOUS

## 2016-10-30 MED ORDER — FENTANYL CITRATE (PF) 100 MCG/2ML IJ SOLN
INTRAMUSCULAR | Status: DC | PRN
  Administered 2016-10-30: 25 ug via INTRAVENOUS

## 2016-10-30 MED ORDER — HEPARIN (PORCINE) IN NACL 2-0.9 UNIT/ML-% IJ SOLN
INTRAMUSCULAR | Status: AC | PRN
  Administered 2016-10-30: 1000 mL

## 2016-10-30 MED ORDER — SODIUM CHLORIDE 0.9 % IV SOLN: INTRAVENOUS | Status: AC

## 2016-10-30 MED ORDER — BUPROPION HCL ER (SR) 150 MG PO TB12
150.0000 mg | ORAL_TABLET | Freq: Two times a day (BID) | ORAL | Status: DC
  Administered 2016-10-30 – 2016-10-31 (×2): 150 mg via ORAL
  Filled 2016-10-30 (×2): qty 1

## 2016-10-30 MED ORDER — LIDOCAINE HCL (PF) 1 % IJ SOLN
INTRAMUSCULAR | Status: AC
  Filled 2016-10-30: qty 30

## 2016-10-30 MED ORDER — METHYLPREDNISOLONE SODIUM SUCC 125 MG IJ SOLR
INTRAMUSCULAR | Status: AC
  Filled 2016-10-30: qty 2

## 2016-10-30 MED ORDER — HEPARIN SODIUM (PORCINE) 1000 UNIT/ML IJ SOLN
INTRAMUSCULAR | Status: AC
  Filled 2016-10-30: qty 1

## 2016-10-30 MED ORDER — SODIUM CHLORIDE 0.9 % WEIGHT BASED INFUSION: 1.0000 mL/kg/h | INTRAVENOUS | Status: DC

## 2016-10-30 MED ORDER — SODIUM CHLORIDE 0.9 % WEIGHT BASED INFUSION: 3.0000 mL/kg/h | INTRAVENOUS | Status: DC

## 2016-10-30 MED ORDER — HEPARIN (PORCINE) IN NACL 2-0.9 UNIT/ML-% IJ SOLN
INTRAMUSCULAR | Status: AC
  Filled 2016-10-30: qty 1000

## 2016-10-30 MED ORDER — MIDAZOLAM HCL 2 MG/2ML IJ SOLN
INTRAMUSCULAR | Status: DC | PRN
  Administered 2016-10-30: 1 mg via INTRAVENOUS

## 2016-10-30 MED ORDER — FAMOTIDINE IN NACL 20-0.9 MG/50ML-% IV SOLN
INTRAVENOUS | Status: AC | PRN
  Administered 2016-10-30: 20 mg via INTRAVENOUS

## 2016-10-30 MED ORDER — DIPHENHYDRAMINE HCL 50 MG/ML IJ SOLN
25.0000 mg | Freq: Once | INTRAMUSCULAR | Status: AC
  Administered 2016-10-30: 25 mg via INTRAVENOUS

## 2016-10-30 MED ORDER — FENTANYL CITRATE (PF) 100 MCG/2ML IJ SOLN
INTRAMUSCULAR | Status: AC
  Filled 2016-10-30: qty 2

## 2016-10-30 MED ORDER — EZETIMIBE 10 MG PO TABS
10.0000 mg | ORAL_TABLET | Freq: Every day | ORAL | Status: DC
  Administered 2016-10-31: 10 mg via ORAL
  Filled 2016-10-30: qty 1

## 2016-10-30 MED ORDER — HEPARIN SODIUM (PORCINE) 1000 UNIT/ML IJ SOLN
INTRAMUSCULAR | Status: DC | PRN
  Administered 2016-10-30: 8000 [IU] via INTRAVENOUS

## 2016-10-30 MED ORDER — CLOPIDOGREL BISULFATE 75 MG PO TABS: 75.0000 mg | ORAL_TABLET | Freq: Every day | ORAL | Status: DC

## 2016-10-30 MED ORDER — HYDRALAZINE HCL 20 MG/ML IJ SOLN: 10.0000 mg | INTRAMUSCULAR | Status: DC | PRN

## 2016-10-30 MED ORDER — CLOPIDOGREL BISULFATE 75 MG PO TABS
75.0000 mg | ORAL_TABLET | Freq: Every day | ORAL | Status: DC
  Administered 2016-10-31: 09:00:00 75 mg via ORAL
  Filled 2016-10-30: qty 1

## 2016-10-30 MED ORDER — DIPHENHYDRAMINE HCL 50 MG/ML IJ SOLN
INTRAMUSCULAR | Status: AC
  Filled 2016-10-30: qty 1

## 2016-10-30 MED ORDER — METHYLPREDNISOLONE SODIUM SUCC 125 MG IJ SOLR
125.0000 mg | Freq: Once | INTRAMUSCULAR | Status: AC
  Administered 2016-10-30: 125 mg via INTRAVENOUS

## 2016-10-30 SURGICAL SUPPLY — 20 items
BALLN MUSTANG 6.0X20 75 (BALLOONS) ×3
BALLOON MUSTANG 6.0X20 75 (BALLOONS) IMPLANT
CATH ANGIO 5F PIGTAIL 65CM (CATHETERS) ×1 IMPLANT
CATH CROSS OVER TEMPO 5F (CATHETERS) ×1 IMPLANT
CATH STRAIGHT 5FR 65CM (CATHETERS) ×1 IMPLANT
GUIDEWIRE ANGLED .035X150CM (WIRE) ×1 IMPLANT
KIT ENCORE 26 ADVANTAGE (KITS) ×1 IMPLANT
KIT PV (KITS) ×3 IMPLANT
SHEATH FLEXOR ANSEL 1 7F 45CM (SHEATH) ×1 IMPLANT
SHEATH PINNACLE 5F 10CM (SHEATH) ×1 IMPLANT
SHEATH PINNACLE 7F 10CM (SHEATH) ×1 IMPLANT
STENT EXPRESS LD 8X17X75 (Permanent Stent) ×1 IMPLANT
STOPCOCK MORSE 400PSI 3WAY (MISCELLANEOUS) ×1 IMPLANT
SYRINGE MEDRAD AVANTA MACH 7 (SYRINGE) ×1 IMPLANT
TAPE RADIOPAQUE TURBO (MISCELLANEOUS) ×1 IMPLANT
TRANSDUCER W/STOPCOCK (MISCELLANEOUS) ×3 IMPLANT
TRAY PV CATH (CUSTOM PROCEDURE TRAY) ×3 IMPLANT
TUBING CIL FLEX 10 FLL-RA (TUBING) ×1 IMPLANT
WIRE HITORQ VERSACORE ST 145CM (WIRE) ×1 IMPLANT
WIRE ROSEN-J .035X260CM (WIRE) ×1 IMPLANT

## 2016-10-30 NOTE — Progress Notes (Signed)
Site area: Right groin a 7 french arterial sheath was removed  Site Prior to Removal:  Level 0  Pressure Applied For 20 MINUTES    Bedrest Beginning at 1115am  Manual:   Yes.    Patient Status During Pull:  stable  Post Pull Groin Site:  Level 0  Post Pull Instructions Given:  Yes.    Post Pull Pulses Present:  Yes.    Dressing Applied:  Yes.    Comments:  VS remain stable during sheath pull

## 2016-10-30 NOTE — Interval H&P Note (Signed)
History and Physical Interval Note:  10/30/2016 7:37 AM  Brady Crumb Sr.  has presented today for surgery, with the diagnosis of claudication  The various methods of treatment have been discussed with the patient and family. After consideration of risks, benefits and other options for treatment, the patient has consented to  Procedure(s): Lower Extremity Angiography (N/A) as a surgical intervention .  The patient's history has been reviewed, patient examined, no change in status, stable for surgery.  I have reviewed the patient's chart and labs.  Questions were answered to the patient's satisfaction.     [name]

## 2016-10-30 NOTE — H&P (Signed)
[] Hide copied text     10/30/16 Brady KATT Sr.   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 Durwin Nora Brooks's and now sees Dr. Claudie Brooks. I last saw him in the office 08/29/16. He has a history of CAD status post RCA stenting back in 2005 with a known chronically occluded circumflex and normal LV function. His cardiac risk factor profile is notable for tobacco abuse and treated hyperlipidemia. He denies chest pain or shortness of breath. I stented his right common iliac artery 12/15/99 with a balloon-expandable stent (8 mm x 2 cm). He had excellent angiographic and clinical results. Over the last 2-3 years she's had progressive claudication in his right hip buttock and leg. Recent workup performed by Dr. Claudie Brooks revealed a right ABI of 0.43 with what appeared to be an occluded right common iliac and SFA. A CT scan confirmed iliac occlusion. Since I saw him in the office 01/22/14 he had arterial Doppler studies performed 01/30/14 revealing a right ABI of 0.31 with an occluded right common iliac and SFA. His left ABI was 25 with a high frequency signal in his distal left SFA. He is symptomatic on the right with Rutherford class IV claudication. He also had a nodule on his preoperative chest x-ray which was confirmed to be a 5 x 6 mm millimeter right upper lobe nodule by CT scanning suspicious for malignancy and patient with a history of tobacco abuse. He has an appointment to see Brady Brooks next month for further evaluation of this. 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 His right ABI improved from .31-.54 and his right hip claudication has completely resolved. Since I saw him to half years ago he's remained stable. Does continue to smoke a pack a day. He denies chest pain, shortness of breath But does complain of bilateral lower extremity lifestyle including claudication. His Dopplers performed 5/70/18 to decline in his Left ABI From 0.76- 0. 59 with a newly occluded left SFA.         Current Outpatient Prescriptions  Medication Sig Dispense Refill  . albuterol (PROVENTIL HFA;VENTOLIN HFA) 108 (90 Base) MCG/ACT inhaler Inhale 2 puffs into the lungs every 6 (six) hours as needed for wheezing or shortness of breath. 3 Inhaler 3  . buPROPion (ZYBAN) 150 MG 12 hr tablet Take 1 tablet (150 mg total) by mouth 2 (two) times daily. 60 tablet 5  . clopidogrel (PLAVIX) 75 MG tablet Take 1 tablet (75 mg total) by mouth daily. 90 tablet 3  . ezetimibe (ZETIA) 10 MG tablet Take 1 tablet (10 mg total) by mouth daily. 30 tablet 11  . fexofenadine (ALLEGRA) 180 MG tablet Take 180 mg by mouth daily. Pt has been taking as needed    . lisinopril (PRINIVIL,ZESTRIL) 5 MG tablet Take 1 tablet (5 mg total) by mouth daily. 30 tablet 11  . simvastatin (ZOCOR) 40 MG tablet Take 1 tablet (40 mg total) by mouth daily. 30 tablet 11  . Tiotropium Bromide-Olodaterol (STIOLTO RESPIMAT) 2.5-2.5 MCG/ACT AERS Inhale 2 puffs into the  lungs daily. 1 Inhaler 12   No current facility-administered medications for this visit.          Allergies  Allergen Reactions  . Aspirin Other (See Comments)    Sneezes, watery nose, wheeze (no polyps)   . Contrast Media [Iodinated Diagnostic Agents]     Need to be pre-medicated  . Erythromycin     All mycin drugs  . Penicillins   . Sulfa Antibiotics     Social History        Social History  . Marital status: Married    Spouse name: Brady Brooks  . Number of children: 2  . Years of education:  N/A       Occupational History  . textile manf         Social History Main Topics  . Smoking status: Current Every Day Smoker    Packs/day: 1.00    Years: 30.00    Types: Cigarettes  . Smokeless tobacco: Never Used  . Alcohol use No  . Drug use: No  . Sexual activity: Not on file       Other Topics Concern  . Not on file      Social History Narrative  . No narrative on file     Review of Systems: General: negative for chills, fever, night sweats or weight changes.  Cardiovascular: negative for chest pain, dyspnea on exertion, edema, orthopnea, palpitations, paroxysmal nocturnal dyspnea or shortness of breath Dermatological: negative for rash Respiratory: negative for cough or wheezing Urologic: negative for hematuria Abdominal: negative for nausea, vomiting, diarrhea, bright red blood per rectum, melena, or hematemesis Neurologic: negative for visual changes, syncope, or dizziness All other systems reviewed and are otherwise negative except as noted above.    Blood pressure (!) 148/80, pulse 90, height 5\' 10"  (1.778 m), weight 209 lb (94.8 kg).  General appearance: alert and no distress Neck: no adenopathy, no carotid bruit, no JVD, supple, symmetrical, trachea midline and thyroid not enlarged, symmetric, no tenderness/mass/nodules Lungs: clear to auscultation bilaterally Heart: regular rate and rhythm, S1, S2 normal, no murmur, click, rub or gallop Extremities: extremities normal, atraumatic, no cyanosis or edema  EKG Not performed today  ASSESSMENT AND PLAN:   Peripheral arterial disease Brady Brooks returns today for follow-up of his Doppler studies. They revealed a right ABI of 0.5-11.59. Left ABI is reduced from 0.76 performed 10/01/14. His distal left SFA has since occluded. He does have bilateral SI limiting lower extremity claudication. He wishes to proceed with angiography and potential intervention. We will address the left SFA today followed  by staged right lower extremity intervention in the near future.     Brady Brooks, M.D., [place], [facility], [place] [address] [place], [place]  [id number]  [phone] 10/30/2016 7:36 AM

## 2016-10-30 NOTE — Discharge Summary (Signed)
Discharge Summary    Patient ID: Brady Knick.,  MRN: [id number], DOB/AGE: 07-09-1950 66 y.o.  Admit date: 10/30/2016 Discharge date: 10/31/2016  Primary Care Provider: Glendon Axe Primary Cardiologist: Gwenlyn Found   Discharge Diagnoses    Active Problems:   Peripheral arterial disease [facility])   Claudication in peripheral vascular disease ([place])   Allergies Allergies  Allergen Reactions  . Aspirin Other (See Comments)    Sneezes, watery nose, wheeze (no polyps)   . Contrast Media [Iodinated Diagnostic Agents]     Need to be pre-medicated, sneezing, watery eyes  . Erythromycin Hives    Childhood allergy All mycin drugs   . Penicillins     Childhood allergy Has patient had a PCN reaction causing immediate rash, facial/tongue/throat swelling, SOB or lightheadedness with hypotension: Unknown Has patient had a PCN reaction causing severe rash involving mucus membranes or skin necrosis: Unknown Has patient had a PCN reaction that required hospitalization: No Has patient had a PCN reaction occurring within the last 10 years: No States he has had benadryl when taking this and had no problems recently    . Sulfa Antibiotics     unknown    Diagnostic Studies/Procedures    PV angiogram: 10/30/16  Angiographic Data:   1: Abdominal aorta-distal abdominal aorta was moderately atherosclerotic 2: Left lower extremity-there was a 50-60% lesion in the proximal portion of the left common iliac artery. There was a 50% ostial left SFA stenosis as well as a moderately long segmental CTO which is thoracoscopically calcified in the mid left SFA. The distal SFA filled by profunda femoral collaterals and there is three-vessel runoff. 3: Right lower extremity-the right common iliac artery stent was widely patent. The right SFA had a moderately long calcified mid CTO with three-vessel runoff.  Final Impression: Successful left common iliac PTA and stenting of a physiologically significant  lesion for lifestyle limiting claudication. The patient's previously placed right common iliac artery stent was widely patent. He has moderately long calcified mid bilateral SFA occlusions with three-vessel runoff. The sheath will be removed once he is because below 170 pressure held. The patient will be hydrated overnight and discharged him in the morning. I'll see him back in the office in 2-3 weeks at which time we will discuss staged SFA intervention.  [name]. 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 He was previously a patient of Dr. Durwin Nora Little's and now sees Dr. Claudie Leach. Dr. Gwenlyn Found last saw him in the office 08/29/16. He has a history of CAD status post RCA stenting back in 2005 with a known chronically occluded circumflex and normal LV function. His cardiac risk factor profile is notable for tobacco abuse and treated hyperlipidemia. Dr. Gwenlyn Found stented his right common iliac artery 12/15/99 with a balloon-expandable stent (8 mm x 2 cm). He had excellent angiographic and clinical results. Over the last 2-3 years he's had progressive claudication in his right hip buttock and leg. Recent workup performed by Dr. Claudie Leach revealed a right ABI of 0.43 with what appeared to be an occluded right common iliac and SFA. A CT scan confirmed iliac occlusion. Arterial Doppler studies performed 01/30/14 revealing a right ABI of 0.31 with an occluded right common iliac and SFA. His left ABI was 25 with  a high frequency signal in his distal left SFA. Dr. Gwenlyn Found performed angiography on him 03/16/14 and was able to percutaneously recanalize his right common iliac artery chronic total occlusion and placed a 7 mm x 30 mm long ICast covered stent. His right ABI improved from .31-.54 and his right hip  claudication has completely resolved. Did complain of bilateral lower extremity lifestyle including claudication. His Dopplers performed 5/70/18 to decline in his Left ABI from 0.76- 0. 59 with a newly occluded left SFA. It was arranged for outpatient PV angiography.   Hospital Course     Underwent PV angiogram with Dr. Gwenlyn Found on 10/30/16 noted above with successful left common iliac PTA and stenting. Will plan for only plavix as he is allergic to aspirin. Post cath labs showed Cr 0.78 and Hgb 14.6. No complications noted post cath.   He was seen by Dr. Angelena Form and determined stable for discharge home. Follow up has been arranged in the office. Medications are listed below.  _____________  Discharge Vitals Blood pressure (!) 152/70, pulse (!) 58, temperature 98.1 F (36.7 C), temperature source Oral, resp. rate 14, height 5\' 10"  (1.778 m), weight 205 lb 0.4 oz (93 kg), SpO2 98 %.  Filed Weights   10/30/16 0557 10/30/16 1136 10/31/16 0320  Weight: 205 lb (93 kg) 205 lb 0.4 oz (93 kg) 205 lb 0.4 oz (93 kg)    Labs & Radiologic Studies    CBC  Recent Labs  10/31/16 0244  WBC 19.3*  HGB 14.6  HCT 43.8  MCV 99.5  PLT 841   Basic Metabolic Panel  Recent Labs  10/31/16 0244  NA 139  K 4.2  CL 107  CO2 25  GLUCOSE 126*  BUN 13  CREATININE 0.78  CALCIUM 8.5*   Liver Function Tests No results for input(s): AST, ALT, ALKPHOS, BILITOT, PROT, ALBUMIN in the last 72 hours. No results for input(s): LIPASE, AMYLASE in the last 72 hours. Cardiac Enzymes No results for input(s): CKTOTAL, CKMB, CKMBINDEX, TROPONINI in the last 72 hours. BNP Invalid input(s): POCBNP D-Dimer No results for input(s): DDIMER in the last 72 hours. Hemoglobin A1C No results for input(s): HGBA1C in the last 72 hours. Fasting Lipid Panel No results for input(s): CHOL, HDL, LDLCALC, TRIG, CHOLHDL, LDLDIRECT in the last 72 hours. Thyroid Function Tests No results for input(s): TSH, T4TOTAL, T3FREE,  THYROIDAB in the last 72 hours.  Invalid input(s): FREET3 _____________  Dg Chest 2 View  Result Date: 10/24/2016 CLINICAL DATA:  Preop, history of claudication, smoking history EXAM: CHEST  2 VIEW COMPARISON:  CT chest of 01/07/2016 and chest x-ray of 02/13/2014 FINDINGS: No active infiltrate or effusion is seen. There is however peribronchial thickening consistent with bronchitis, possibly chronic in this patient with a smoking history. Mediastinal and hilar contours are unremarkable. The heart is within normal limits in size. The area questioned previously at the left lung base is unchanged and may represent scarring or epicardial fat. No acute bony abnormality is seen. An old healed left midclavicular fracture is present. IMPRESSION: 1. Peribronchial thickening consistent with bronchitis. 2. No active process. Electronically Signed   By: Ivar Drape M.D.   On: 10/24/2016 09:01   Disposition   Pt is being discharged home today in good condition.  Follow-up Plans & Appointments    Follow-up Information    Erma Heritage, PA-C Follow up on 11/16/2016.   Specialties:  Physician Assistant, Cardiology Why:  at 10am for your follow up appt.  Contact  information: [address] [place] [address] [phone]        [facility] Follow up.   Specialty:  Cardiology Why:  The office will call you to arrange follow up dopplers.  Contact information: [address] [place] [phone]         Discharge Instructions    Call MD for:  redness, tenderness, or signs of infection (pain, swelling, redness, odor or green/yellow discharge around incision site)    Complete by:  As directed    Diet - low sodium heart healthy    Complete by:  As directed    Discharge instructions    Complete by:  As directed    Groin Site Care Refer to this sheet in the next few weeks. These instructions provide you with information on  caring for yourself after your procedure. Your caregiver may also give you more specific instructions. Your treatment has been planned according to current medical practices, but problems sometimes occur. Call your caregiver if you have any problems or questions after your procedure. HOME CARE INSTRUCTIONS You may shower 24 hours after the procedure. Remove the bandage (dressing) and gently wash the site with plain soap and water. Gently pat the site dry.  Do not apply powder or lotion to the site.  Do not sit in a bathtub, swimming pool, or whirlpool for 5 to 7 days.  No bending, squatting, or lifting anything over 10 pounds (4.5 kg) as directed by your caregiver.  Inspect the site at least twice daily.  Do not drive home if you are discharged the same day of the procedure. Have someone else drive you.  You may drive 24 hours after the procedure unless otherwise instructed by your caregiver.  What to expect: Any bruising will usually fade within 1 to 2 weeks.  Blood that collects in the tissue (hematoma) may be painful to the touch. It should usually decrease in size and tenderness within 1 to 2 weeks.  SEEK IMMEDIATE MEDICAL CARE IF: You have unusual pain at the groin site or down the affected leg.  You have redness, warmth, swelling, or pain at the groin site.  You have drainage (other than a small amount of blood on the dressing).  You have chills.  You have a fever or persistent symptoms for more than 72 hours.  You have a fever and your symptoms suddenly get worse.  Your leg becomes pale, cool, tingly, or numb.  You have heavy bleeding from the site. Hold pressure on the site. .   Increase activity slowly    Complete by:  As directed       Discharge Medications   Current Discharge Medication List    CONTINUE these medications which have NOT CHANGED   Details  albuterol (PROVENTIL HFA;VENTOLIN HFA) 108 (90 Base) MCG/ACT inhaler Inhale 2 puffs into the lungs every 6 (six) hours as  needed for wheezing or shortness of breath. Qty: 3 Inhaler, Refills: 3    buPROPion (WELLBUTRIN SR) 150 MG 12 hr tablet Take 1 tablet (150 mg total) by mouth 2 (two) times daily. Qty: 60 tablet, Refills: 3    clopidogrel (PLAVIX) 75 MG tablet Take 1 tablet (75 mg total) by mouth daily. Qty: 90 tablet, Refills: 3    Coenzyme Q10 (COQ10) 100 MG CAPS Take 1 capsule by mouth daily.    ezetimibe (ZETIA) 10 MG tablet Take 1 tablet (10 mg total) by mouth daily. Qty: 30 tablet,  Refills: 11    fexofenadine (ALLEGRA) 180 MG tablet Take 180 mg by mouth daily.     lisinopril (PRINIVIL,ZESTRIL) 5 MG tablet Take 1 tablet (5 mg total) by mouth daily. Qty: 30 tablet, Refills: 11    simvastatin (ZOCOR) 40 MG tablet Take 1 tablet (40 mg total) by mouth daily. Qty: 30 tablet, Refills: 11         Outstanding Labs/Studies   Follow up dopplers  Duration of Discharge Encounter   Greater than 30 minutes including physician time.  Signed, Reino Bellis NP-C 10/31/2016, 8:53 AM   I have personally seen and examined this patient with Reino Bellis, NP-C. I agree with the assessment and plan as outlined above. He is s/p placement of a stent in his left common iliac artery per Dr. Gwenlyn Found 09/30/16. He is doing well this am. Labs reviewed by me. Vitals stable. My exam shows a WDWN male in NAD, CV:RRR, Pulm:clear lungs. Ext: no LE edema, faint pedal pulses bilaterally. Both feet are warm to touch. Plan: 1.Lifestyle limiting claudication/PAD: He is s/p stenting of the left common iliac artery. Will continue Plavix. He has an ASA allergy. Discharge home today and plan f/u with Dr. Gwenlyn Found in 2 weeks.   Lauree Chandler 10/31/2016 8:53 AM

## 2016-10-31 ENCOUNTER — Other Ambulatory Visit: Payer: Self-pay | Admitting: Cardiology

## 2016-10-31 DIAGNOSIS — I739 Peripheral vascular disease, unspecified: Secondary | ICD-10-CM

## 2016-10-31 DIAGNOSIS — F17210 Nicotine dependence, cigarettes, uncomplicated: Secondary | ICD-10-CM | POA: Diagnosis not present

## 2016-10-31 DIAGNOSIS — Z6829 Body mass index (BMI) 29.0-29.9, adult: Secondary | ICD-10-CM | POA: Diagnosis not present

## 2016-10-31 DIAGNOSIS — I700 Atherosclerosis of aorta: Secondary | ICD-10-CM | POA: Diagnosis not present

## 2016-10-31 DIAGNOSIS — I70213 Atherosclerosis of native arteries of extremities with intermittent claudication, bilateral legs: Secondary | ICD-10-CM | POA: Diagnosis not present

## 2016-10-31 DIAGNOSIS — I7092 Chronic total occlusion of artery of the extremities: Secondary | ICD-10-CM | POA: Diagnosis not present

## 2016-10-31 DIAGNOSIS — E663 Overweight: Secondary | ICD-10-CM | POA: Diagnosis not present

## 2016-10-31 LAB — BASIC METABOLIC PANEL
ANION GAP: 7 (ref 5–15)
BUN: 13 mg/dL (ref 6–20)
CHLORIDE: 107 mmol/L (ref 101–111)
CO2: 25 mmol/L (ref 22–32)
Calcium: 8.5 mg/dL — ABNORMAL LOW (ref 8.9–10.3)
Creatinine, Ser: 0.78 mg/dL (ref 0.61–1.24)
Glucose, Bld: 126 mg/dL — ABNORMAL HIGH (ref 65–99)
POTASSIUM: 4.2 mmol/L (ref 3.5–5.1)
SODIUM: 139 mmol/L (ref 135–145)

## 2016-10-31 LAB — CBC
HEMATOCRIT: 43.8 % (ref 39.0–52.0)
Hemoglobin: 14.6 g/dL (ref 13.0–17.0)
MCH: 33.2 pg (ref 26.0–34.0)
MCHC: 33.3 g/dL (ref 30.0–36.0)
MCV: 99.5 fL (ref 78.0–100.0)
PLATELETS: 192 10*3/uL (ref 150–400)
RBC: 4.4 MIL/uL (ref 4.22–5.81)
RDW: 12.9 % (ref 11.5–15.5)
WBC: 19.3 10*3/uL — AB (ref 4.0–10.5)

## 2016-10-31 NOTE — Progress Notes (Signed)
Tech offered Pt a bath. Pt stated that he can do his bath, the only thing he needed was for the tech to get the telemetry box out of gown pocket. Tech assisted with removing tele box and gave Pt all bath supplies.

## 2016-10-31 NOTE — Progress Notes (Signed)
Pt given all discharge instructions and verbalized understanding of all. Pt understands all his home medications. Pt is without any kind of pain at dischg.  Pt is discharged  home via wc with wife. All belongings with pt.

## 2016-11-14 ENCOUNTER — Ambulatory Visit (HOSPITAL_COMMUNITY)
Admission: RE | Admit: 2016-11-14 | Discharge: 2016-11-14 | Disposition: A | Discharge status: Home / Self Care | Payer: Medicare Other | Source: Ambulatory Visit | Attending: Cardiovascular Disease | Admitting: Cardiovascular Disease

## 2016-11-14 ENCOUNTER — Ambulatory Visit (HOSPITAL_COMMUNITY)
Admission: RE | Admit: 2016-11-14 | Discharge: 2016-11-14 | Disposition: A | Discharge status: Home / Self Care | Payer: Medicare Other | Source: Ambulatory Visit | Attending: Cardiology | Admitting: Cardiology

## 2016-11-14 DIAGNOSIS — I739 Peripheral vascular disease, unspecified: Secondary | ICD-10-CM | POA: Diagnosis not present

## 2016-11-14 DIAGNOSIS — I708 Atherosclerosis of other arteries: Secondary | ICD-10-CM | POA: Diagnosis not present

## 2016-11-14 DIAGNOSIS — I700 Atherosclerosis of aorta: Secondary | ICD-10-CM | POA: Insufficient documentation

## 2016-11-14 DIAGNOSIS — E785 Hyperlipidemia, unspecified: Secondary | ICD-10-CM | POA: Diagnosis not present

## 2016-11-14 LAB — LIPID PANEL
CHOL/HDL RATIO: 2.5 ratio (ref 0.0–5.0)
Cholesterol, Total: 143 mg/dL (ref 100–199)
HDL: 58 mg/dL (ref >39)
LDL CALC: 63 mg/dL (ref 0–99)
Triglycerides: 108 mg/dL (ref 0–149)
VLDL Cholesterol Cal: 22 mg/dL (ref 5–40)

## 2016-11-15 NOTE — Progress Notes (Signed)
Cardiology Office Note    Date:  11/16/2016   ID:  Brady Crumb Sr., DOB 10-23-1950, MRN [id number]  PCP:  Glendon Axe, MD  Cardiologist: Dr. Gwenlyn Found    Chief Complaint  Patient presents with  . Follow-up    Lower Extremity Angiography    History of Present Illness:    Brady Cyr. is a 66 y.o. male with past medical history of CAD (s/p stent to RCA in 2005 with known chronically occluded LCx), PVD (s/p right iliac stent in 2001, s/p PCI of CTO of RICA in 2015), HTN, HLD, and continued tobacco use who presents to the office today for follow-up of his recent peripheral angiography.   He was examined by Dr. Gwenlyn Found in 08/2016 and reported lower extremity pain consistent with claudication. Doppler studies were obtained and showed a decline in his left ABI from 0.76 to 0.59 with a new occlusion of the left SFA. He presented to [facility] on 10/30/2016 for the procedure and underwent successful left common iliac PTA and stenting. He had moderate bilateral SFA occlusions and it was recommended he follow-up to discuss staged SFA intervention. He was placed only on Plavix at the time of discharge with his known intolerance to ASA.    Repeat dopplers on 11/14/2016 showed bilateral ABI's are in the moderate range, with no improvement since pre-common iliac stent on the left. Dopplers showed aorto-iliac atherosclerosis, with >50% bilateral common and right external iliac artery stenosis, low end of range, s/p bilateral common iliac stents.  In talking with the patient today, he reports doing well since his recent procedure. Reports some improvement in the pain he was experiencing along his right upper leg. No complications regarding his angiography sites. He has been less active since the procedure due to "taking it easy" but has been able to walk around his house without any claudication symptoms. Has not tried push-mowing the yard, which is the activity he experienced most symptoms with.  Denies any recent chest pain, dyspnea on exertion, orthopnea, PND or lower extremity edema.   He reports good compliance with Plavix. Denies any evidence of active bleeding. Has reduced his tobacco use from 1 ppd to 5 cigarettes per day.    Past Medical History:  Diagnosis Date  . COPD (chronic obstructive pulmonary disease) ([place])   . Coronary artery disease    a. s/p RCA stenting back in 2005 with a known chronically occluded circumflex and normal LV function.  . Emphysema lung ([place])   . GERD (gastroesophageal reflux disease)   . Heart attack ([place]) <2005  . History of tobacco abuse   . Hyperlipidemia   . Hypertension   . Lung nodule    a. Suspicious for malignancy, undergoing workup.  . Obesity   . Peripheral arterial disease ([place])    a. history of stent to right common iliac artery 12/15/99 with a peak for balloon-expandable stent. b. s/p intervention on RCIA total occlusion PTA and stent, residual disease on the right for possible staged intervention, notable disease on the left but asymptomatic in 2015. c. 10/2016: s/p PTA and stenting of left common iliac    Past Surgical History:  Procedure Laterality Date  . CARDIAC CATHETERIZATION  10/26/1999   Archie Endo 09/13/2010  . Cardiolite study     59% ejection fraction and negative for ischemia  . CORONARY ANGIOPLASTY WITH STENT PLACEMENT  2005   Taxus stent placed to his RCA   . HERNIA REPAIR    . ILIAC  ARTERY STENT Right 12/15/1999   a. history of stent to right common iliac artery 12/15/99 with a peak for balloon-expandable stent. b. s/p intervention on RCIA total occlusion PTA and stent, residual disease on the right for possible staged intervention, notable disease on the left but asymptomatic.  Marland Kitchen ILIAC VEIN ANGIOPLASTY / STENTING Right 03/16/2014   rt common iliac    by dr berry  . LAPAROSCOPIC CHOLECYSTECTOMY  1990s  . LOWER EXTREMITY ANGIOGRAM N/A 03/16/2014   Procedure: LOWER EXTREMITY ANGIOGRAM;  Surgeon: [name],  MD;  Location: [facility] CATH LAB;  Service: Cardiovascular;  Laterality: N/A;  . LOWER EXTREMITY ANGIOGRAPHY N/A 10/30/2016   Procedure: Lower Extremity Angiography;  Surgeon: [name], MD;  Location: [place] CV LAB;  Service: Cardiovascular;  Laterality: N/A;  . PERIPHERAL VASCULAR INTERVENTION Left 10/30/2016   Procedure: Peripheral Vascular Intervention;  Surgeon: [name], MD;  Location: [place] CV LAB;  Service: Cardiovascular;  Laterality: Left;  Lt Com ILIAC  . TONSILLECTOMY AND ADENOIDECTOMY  1950s  . UMBILICAL HERNIA REPAIR  2012    Current Medications: Outpatient Medications Prior to Visit  Medication Sig Dispense Refill  . albuterol (PROVENTIL HFA;VENTOLIN HFA) 108 (90 Base) MCG/ACT inhaler Inhale 2 puffs into the lungs every 6 (six) hours as needed for wheezing or shortness of breath. 3 Inhaler 3  . buPROPion (WELLBUTRIN SR) 150 MG 12 hr tablet Take 1 tablet (150 mg total) by mouth 2 (two) times daily. 60 tablet 3  . clopidogrel (PLAVIX) 75 MG tablet Take 1 tablet (75 mg total) by mouth daily. 90 tablet 3  . Coenzyme Q10 (COQ10) 100 MG CAPS Take 1 capsule by mouth daily.    Marland Kitchen ezetimibe (ZETIA) 10 MG tablet Take 1 tablet (10 mg total) by mouth daily. 30 tablet 11  . fexofenadine (ALLEGRA) 180 MG tablet Take 180 mg by mouth daily.     Marland Kitchen lisinopril (PRINIVIL,ZESTRIL) 5 MG tablet Take 1 tablet (5 mg total) by mouth daily. 30 tablet 11  . simvastatin (ZOCOR) 40 MG tablet Take 1 tablet (40 mg total) by mouth daily. 30 tablet 11   No facility-administered medications prior to visit.      Allergies:   Aspirin; Contrast media [iodinated diagnostic agents]; Erythromycin; Penicillins; and Sulfa antibiotics   Social History   Social History  . Marital status: Married    Spouse name: Olin Hauser  . Number of children: 2  . Years of education: N/A   Occupational History  . textile manf    Social History Main Topics  . Smoking status: Current Every Day Smoker     Packs/day: 1.00    Years: 50.00    Types: Cigarettes  . Smokeless tobacco: Never Used  . Alcohol use Yes     Comment: 10/30/2016 "might have a few drinks 4-5 times/year"  . Drug use: No  . Sexual activity: Not Currently   Other Topics Concern  . None   Social History Narrative  . None     Family History:  The patient's family history includes Asthma in his son; CAD in his brother and brother; Heart disease in his father; Lung cancer in his paternal grandfather.   Review of Systems:   Please see the history of present illness.     General:  No chills, fever, night sweats or weight changes.  Cardiovascular:  No chest pain, dyspnea on exertion, edema, orthopnea, palpitations, paroxysmal nocturnal dyspnea. Positive for claudication.  Dermatological: No rash, lesions/masses Respiratory: No cough,  dyspnea Urologic: No hematuria, dysuria Abdominal:   No nausea, vomiting, diarrhea, bright red blood per rectum, melena, or hematemesis Neurologic:  No visual changes, wkns, changes in mental status.  All other systems reviewed and are otherwise negative except as noted above.   Physical Exam:    VS:  BP (!) 152/82 (BP Location: Left Arm, Cuff Size: Normal)   Pulse 76   Ht 5\' 10"  (1.778 m)   Wt 208 lb 3.2 oz (94.4 kg)   SpO2 98%   BMI 29.87 kg/m    General: Well developed, well nourished Caucasian male appearing in no acute distress. Head: Normocephalic, atraumatic, sclera non-icteric, no xanthomas, nares are without discharge.  Neck: No carotid bruits. JVD not elevated.  Lungs: Respirations regular and unlabored, without wheezes or rales.  Heart: Regular rate and rhythm. No S3 or S4.  No murmur, no rubs, or gallops appreciated. Abdomen: Soft, non-tender, non-distended with normoactive bowel sounds. No hepatomegaly. No rebound/guarding. No obvious abdominal masses. Msk:  Strength and tone appear normal for age. No joint deformities or effusions. Extremities: No clubbing or cyanosis.  No edema.  Distal pedal pulses are 1+ bilaterally. Groin sites are stable with no ecchymosis or evidence of a hematoma.  Neuro: Alert and oriented X 3. Moves all extremities spontaneously. No focal deficits noted. Psych:  Responds to questions appropriately with a normal affect. Skin: No rashes or lesions noted  Wt Readings from Last 3 Encounters:  11/16/16 208 lb 3.2 oz (94.4 kg)  10/31/16 205 lb 0.4 oz (93 kg)  09/28/16 209 lb (94.8 kg)     Studies/Labs Reviewed:   EKG:  EKG is not ordered today.   Recent Labs: 09/12/2016: ALT 20; TSH 0.87 10/31/2016: BUN 13; Creatinine, Ser 0.78; Hemoglobin 14.6; Platelets 192; Potassium 4.2; Sodium 139   Lipid Panel    Component Value Date/Time   CHOL 143 11/14/2016 0000   TRIG 108 11/14/2016 0000   HDL 58 11/14/2016 0000   CHOLHDL 2.5 11/14/2016 0000   CHOLHDL 5.5 (H) 09/12/2016 0821   VLDL 46 (H) 09/12/2016 0821   LDLCALC 63 11/14/2016 0000    Additional studies/ records that were reviewed today include:   Lower Extremity Angiography: 10/30/2016 Angiographic Data:   1: Abdominal aorta-distal abdominal aorta was moderately atherosclerotic 2: Left lower extremity-there was a 50-60% lesion in the proximal portion of the left common iliac artery. There was a 50% ostial left SFA stenosis as well as a moderately long segmental CTO which is thoracoscopically calcified in the mid left SFA. The distal SFA filled by profunda femoral collaterals and there is three-vessel runoff. 3: Right lower extremity-the right common iliac artery stent was widely patent. The right SFA had a moderately long calcified mid CTO with three-vessel runoff.  IMPRESSION: Given the fact that there was a 30 mm gradient across the proximal left common iliac artery using only a 5 French end hole catheter I elected to stent this initially and potentially address the left SFA. The proximal right common iliac artery stent was widely patent and there were bilateral mid moderate  left calcified SFA CTO's.  Procedure Description: Contralateral access obtained with a crossover catheter, a 35 angled Glidewire, a free-tie frozen wire and a 7 [place] 45 cm multipurpose Ansel  Sheath. The patient received a potassium of heparin intravenously with an ACT of 263 falling to 2:30 by the end of the case. Total contrast administered patient was 220 mL. I predilated the proximal left common iliac artery through the right  sided sheath which I placed just over the bifurcation with a 6 mm x 2 cm balloon and stented with a 8 mm x 17 mm long balloon expandable stent to nominal pressures reducing a 60% stenosis to 0% residual. I then attempted to cross this with the Ansel sheath within tension to address the mid left SFA CTO however I was unable to cross the left iliac stent and therefore decided to abort the portion of the procedure. The Ansel sheath was then exchanged over 35 wire for a short 7 French sheath which was then secured in place. The patient was already on Plavix and his aspirin allergic. He left the lab in stable condition.  Final Impression: Successful left common iliac PTA and stenting of a physiologically significant lesion for lifestyle limiting claudication. The patient's previously placed right common iliac artery stent was widely patent. He has moderately long calcified mid bilateral SFA occlusions with three-vessel runoff. The sheath will be removed once he is because below 170 pressure held. The patient will be hydrated overnight and discharged him in the morning. I'll see him back in the office in 2-3 weeks at which time we will discuss staged SFA intervention.   Vascular ABI's: 11/14/2016 Bilateral ABI's are in the moderate range, with no improvement since pre-common iliac stent on the left. Patient does have known right SFA occlusion, and left SFA stenosis. Bilateral great toe pressures are abnormal. See Aorto-iliac duplex report f/u 6 months  Lower Extremity Dopplers:  11/14/2016 Technically challenging study. Normal caliber abdominal aorta, common and external iliac arteries, without focal dilatation. Aorto-iliac atherosclerosis, with >50% bilateral common and right external iliac artery stenosis, low end of range, s/p bilateral common iliac stents.   Assessment:    1. PVD (peripheral vascular disease) ([place])   2. Claudication ([place])   3. Coronary artery disease involving native coronary artery of native heart without angina pectoris   4. Essential hypertension   5. Hyperlipidemia LDL goal <70   6. Tobacco abuse      Plan:   In order of problems listed above:  1. Peripheral Vascular Disease/ Claudication - he has known PVD, s/p right iliac stent in 2001 and PCI of CTO of RICA in 2015. Underwent repeat peripheral angiography on 10/30/2016 with successful left common iliac PTA and stenting. He had moderate bilateral SFA occlusions and it was recommended he follow-up to discuss staged SFA intervention.  - ABI's on 11/14/2016 were in the moderate range, with no improvement since pre-common iliac stent on the left. - overall, he has progressed well from his recent intervention and denies any known complications. We discussed proceeding with planned SFA intervention and this was reviewed with Dr. Gwenlyn Found. The patient understands that risks include but are not limited to stroke (1 in 1000), death (1 in 34), kidney failure [usually temporary] (1 in 500), bleeding (1 in 200), allergic reaction [possibly serious] (1 in 200). Will tentatively schedule for 11/30/2016 with Dr. Gwenlyn Found. He will come back for repeat lab work next week.   ADDENDUM: Dr. Gwenlyn Found reached out after the patient's appointment and wants to see the patient back in his office on 7/24 due to the complexity of his procedure. Staff message has been sent to scheduling. Will keep angiography scheduled for now.   2. CAD - s/p stent to RCA in 2005 with known chronically occluded LCx. - he denies any recent  chest pain or dyspnea on exertion. - continue Plavix and statin therapy.  3. HTN - BP is slightly  elevated at 152/82 during today's visit. Has not yet taken his morning medications.  - continue Lisinopril 5mg  daily. Can further titrate if BP remains elevated.   4. HLD - Lipid Panel on 11/14/16 showed improved numbers with total cholesterol of 143, HDL 58, and LDL 63. - remains on Simvastatin 40mg  daily and Zetia 10mg  daily.   5. Tobacco Use - he has reduced his tobacco use from 1 ppd to 5 cigarettes per day. Congratulated on this with complete cessation advised.   Medication Adjustments/Labs and Tests Ordered: Current medicines are reviewed at length with the patient today.  Concerns regarding medicines are outlined above.  Medication changes, Labs and Tests ordered today are listed in the Patient Instructions below. Patient Instructions      Medication Instructions:  Your physician recommends that you continue on your current medications as directed. Please refer to the Current Medication list given to you today.  Labwork: PT WILL COME BACK FOR:  BMET, CBC, & PT/INR   Testing/Procedures: Your physician has requested that you have a peripheral vascular angiogram. This exam is performed at the hospital. During this exam IV contrast is used to look at arterial blood flow. Please review the information sheet given for details.  Follow-Up: Your physician recommends that you schedule a follow-up appointment in: 2 WEEKS AFTER 11/30/16 WITH DR. Gwenlyn Found  Any Other Special Instructions Will Be Listed Below (If Applicable).  If you need a refill on your cardiac medications before your next appointment, please call your pharmacy.  [facility] [address] [place] [address] Dept: [phone] Loc: [place].  11/16/2016  You are scheduled for a Peripheral Angiogram on  Thursday, August 2 with Dr. [name].  1. Please arrive at the [facility] (Main Entrance A) at [facility]: [address] [place], [place] [address] at 7:30 AM (two hours before your procedure to ensure your preparation). Free valet parking service is available.   Special note: Every effort is made to have your procedure done on time. Please understand that emergencies sometimes delay scheduled procedures.  2. Diet: Do not eat or drink anything after midnight prior to your procedure except sips of water to take medications.  3. Labs: MAKE SURE YOU COME BACK TO GET YOUR LABS AS SOON AS YOU KNOW THAT THIS DATE IS GOOD FOR YOU.  4. Medication instructions in preparation for your procedure:  On the morning of your procedure, take your Plavix/Clopidogrel and any morning medicines.  You may use sips of water.  5. Plan for one night stay--bring personal belongings. 6. Bring a current list of your medications and current insurance cards. 7. You MUST have a responsible person to drive you home. 8. Someone MUST be with you the first 24 hours after you arrive home or your discharge will be delayed. 9. Please wear clothes that are easy to get on and off and wear slip-on shoes.  Thank you for allowing [place] to care for you!   -- [facility] Invasive Cardiovascular services    Signed, Waynetta Pean  11/16/2016 5:51 PM    [place], [place], [place]  [address] Phone: [phone]; Fax: [phone]  [address] [place], [place] [address] Phone: [phone]

## 2016-11-16 ENCOUNTER — Ambulatory Visit (INDEPENDENT_AMBULATORY_CARE_PROVIDER_SITE_OTHER): Payer: Medicare Other | Admitting: Student

## 2016-11-16 ENCOUNTER — Encounter: Payer: Self-pay | Admitting: Student

## 2016-11-16 VITALS — BP 152/82 | HR 76 | Ht 70.0 in | Wt 208.2 lb

## 2016-11-16 DIAGNOSIS — E785 Hyperlipidemia, unspecified: Secondary | ICD-10-CM | POA: Diagnosis not present

## 2016-11-16 DIAGNOSIS — I739 Peripheral vascular disease, unspecified: Secondary | ICD-10-CM | POA: Diagnosis not present

## 2016-11-16 DIAGNOSIS — I2510 Atherosclerotic heart disease of native coronary artery without angina pectoris: Secondary | ICD-10-CM

## 2016-11-16 DIAGNOSIS — Z720 Tobacco use: Secondary | ICD-10-CM

## 2016-11-16 DIAGNOSIS — I10 Essential (primary) hypertension: Secondary | ICD-10-CM | POA: Diagnosis not present

## 2016-11-16 NOTE — Patient Instructions (Signed)
    Medication Instructions:  Your physician recommends that you continue on your current medications as directed. Please refer to the Current Medication list given to you today.  Labwork: PT WILL COME BACK FOR:  BMET, CBC, & PT/INR   Testing/Procedures: Your physician has requested that you have a peripheral vascular angiogram. This exam is performed at the hospital. During this exam IV contrast is used to look at arterial blood flow. Please review the information sheet given for details.   Follow-Up: Your physician recommends that you schedule a follow-up appointment in: 2 WEEKS AFTER 11/30/16 WITH DR. Gwenlyn Found  Any Other Special Instructions Will Be Listed Below (If Applicable).     If you need a refill on your cardiac medications before your next appointment, please call your pharmacy.    [facility] [address] [place] [address] [address] Dept: [phone] Loc: [place].  11/16/2016  You are scheduled for a Peripheral Angiogram on Thursday, August 2 with Dr. [name].  1. Please arrive at the [facility] (Main Entrance A) at [facility]: [address] [place], [place] [address] at 7:30 AM (two hours before your procedure to ensure your preparation). Free valet parking service is available.   Special note: Every effort is made to have your procedure done on time. Please understand that emergencies sometimes delay scheduled procedures.  2. Diet: Do not eat or drink anything after midnight prior to your procedure except sips of water to take medications.  3. Labs: MAKE SURE YOU COME BACK TO GET YOUR LABS AS SOON AS YOU KNOW THAT THIS DATE IS GOOD FOR YOU.  4. Medication instructions in preparation for your procedure:  On the morning of your procedure, take your Plavix/Clopidogrel and any morning medicines.  You may use sips of water.  5. Plan for one  night stay--bring personal belongings. 6. Bring a current list of your medications and current insurance cards. 7. You MUST have a responsible person to drive you home. 8. Someone MUST be with you the first 24 hours after you arrive home or your discharge will be delayed. 9. Please wear clothes that are easy to get on and off and wear slip-on shoes.  Thank you for allowing [place] to care for you!   -- [place] Invasive Cardiovascular services

## 2016-11-21 ENCOUNTER — Ambulatory Visit (INDEPENDENT_AMBULATORY_CARE_PROVIDER_SITE_OTHER): Payer: Medicare Other | Admitting: Cardiovascular Disease

## 2016-11-21 ENCOUNTER — Encounter: Payer: Self-pay | Admitting: Cardiovascular Disease

## 2016-11-21 ENCOUNTER — Other Ambulatory Visit: Payer: Self-pay

## 2016-11-21 ENCOUNTER — Other Ambulatory Visit: Payer: Self-pay | Admitting: Student

## 2016-11-21 DIAGNOSIS — I739 Peripheral vascular disease, unspecified: Secondary | ICD-10-CM

## 2016-11-21 DIAGNOSIS — I10 Essential (primary) hypertension: Secondary | ICD-10-CM

## 2016-11-21 DIAGNOSIS — I2510 Atherosclerotic heart disease of native coronary artery without angina pectoris: Secondary | ICD-10-CM | POA: Diagnosis not present

## 2016-11-21 LAB — BASIC METABOLIC PANEL
BUN / CREAT RATIO: 16 (ref 10–24)
BUN: 13 mg/dL (ref 8–27)
CALCIUM: 9.7 mg/dL (ref 8.6–10.2)
CHLORIDE: 101 mmol/L (ref 96–106)
CO2: 23 mmol/L (ref 20–29)
Creatinine, Ser: 0.8 mg/dL (ref 0.76–1.27)
GFR calc non Af Amer: 94 mL/min/{1.73_m2} (ref >59)
GFR, EST AFRICAN AMERICAN: 108 mL/min/{1.73_m2} (ref >59)
GLUCOSE: 136 mg/dL — AB (ref 65–99)
POTASSIUM: 4.7 mmol/L (ref 3.5–5.2)
Sodium: 141 mmol/L (ref 134–144)

## 2016-11-21 LAB — CBC
HEMATOCRIT: 47.9 % (ref 37.5–51.0)
HEMOGLOBIN: 17.3 g/dL (ref 13.0–17.7)
MCH: 35.1 pg — ABNORMAL HIGH (ref 26.6–33.0)
MCHC: 36.1 g/dL — ABNORMAL HIGH (ref 31.5–35.7)
MCV: 97 fL (ref 79–97)
Platelets: 238 10*3/uL (ref 150–379)
RBC: 4.93 x10E6/uL (ref 4.14–5.80)
RDW: 13.2 % (ref 12.3–15.4)
WBC: 8.5 10*3/uL (ref 3.4–10.8)

## 2016-11-21 LAB — PROTIME-INR
INR: 1 (ref 0.8–1.2)
PROTHROMBIN TIME: 10.8 s (ref 9.1–12.0)

## 2016-11-21 NOTE — Patient Instructions (Signed)
Medication Instructions: Your physician recommends that you continue on your current medications as directed. Please refer to the Current Medication list given to you today.  Please refer to procedure instructions given to you at last visit for any medication instructions.    Labwork: Labwork can be done today for pre-procedure.   Follow-Up: You are scheduled for a Lower Extremity Angiogram on 11/30/16 at 9:30 AM with Dr. Gwenlyn Found. Please arrive at the hospital at 7:30 AM.

## 2016-11-21 NOTE — Assessment & Plan Note (Signed)
Mr. Brady Brooks returns today to discuss his upcoming procedure already scheduled for 11/30/16. I performed stenting of his proximal left common iliac artery 10/30/16. He has a previously placed, widely patent, right common iliac artery stent and bilaterally occluded SFAs over a long segment which appear calcified. He is more symptomatic on the right side. My intent is to perform endovascular therapy of his right SFA CTO.

## 2016-11-21 NOTE — Progress Notes (Signed)
Mr. Bong returns today to discuss his upcoming procedure already scheduled for 11/30/16. I performed stenting of his proximal left common iliac artery 10/30/16. He has a previously placed, widely patent, right common iliac artery stent and bilaterally occluded SFAs over a long segment which appear calcified. He is more symptomatic on the right side. My intent is to perform endovascular therapy of his right SFA CTO.

## 2016-11-24 ENCOUNTER — Telehealth: Payer: Self-pay | Admitting: Cardiovascular Disease

## 2016-11-24 NOTE — Telephone Encounter (Signed)
Called patient, unable to leave voicemail. Will try to call back later.

## 2016-11-24 NOTE — Telephone Encounter (Signed)
Patient returning call for results 

## 2016-11-29 ENCOUNTER — Other Ambulatory Visit: Payer: Self-pay | Admitting: Cardiovascular Disease

## 2016-11-29 ENCOUNTER — Telehealth: Payer: Self-pay | Admitting: Cardiovascular Disease

## 2016-11-29 DIAGNOSIS — L2389 Allergic contact dermatitis due to other agents: Secondary | ICD-10-CM | POA: Diagnosis not present

## 2016-11-29 DIAGNOSIS — I739 Peripheral vascular disease, unspecified: Secondary | ICD-10-CM

## 2016-11-29 NOTE — Telephone Encounter (Signed)
New message      Pt is due to have a procedure tomorrow by Dr Gwenlyn Found.  Today, pt has a rash on his left leg from the knee cap up.  Wife says he has at least 50 pus pockets itchy rash. [facility].  Calling to let the doctor know procedure may have to be cancelled.

## 2016-11-29 NOTE — Telephone Encounter (Signed)
Pt's wife calling back stated the rash maybe some type of bug bite... Pt was given a cream for it.    Please give them a call back with any questions.

## 2016-11-29 NOTE — Telephone Encounter (Signed)
Patient is having issue with left leg per wife. Wife unsure if this is chigger bites, mosquito bites, poison ivy. She states it looks like a red pustule w/clear center with drainage that has spread, since Sunday. Patient is planning to go to [facility] to have assessment. He has been applying OTC cream/ointment w/no improvement.  Patient is scheduled to have PV procedure on right leg 8/2.  Advised patient/wife to call back regarding procedure and if patient wants to proceed  Will forward to [place], CMA as Juluis Rainier

## 2016-11-30 ENCOUNTER — Encounter: Payer: Self-pay | Admitting: Physician Assistant

## 2016-11-30 ENCOUNTER — Observation Stay (HOSPITAL_COMMUNITY)
Admission: RE | Admit: 2016-11-30 | Discharge: 2016-12-01 | Disposition: A | Discharge status: Home / Self Care | Payer: Medicare Other | Source: Ambulatory Visit | Attending: Cardiovascular Disease | Admitting: Cardiovascular Disease

## 2016-11-30 ENCOUNTER — Encounter (HOSPITAL_COMMUNITY): Payer: Self-pay | Admitting: *Deleted

## 2016-11-30 ENCOUNTER — Encounter (HOSPITAL_COMMUNITY)
Admission: RE | Disposition: A | Discharge status: Home / Self Care | Payer: Self-pay | Source: Ambulatory Visit | Attending: Cardiovascular Disease

## 2016-11-30 DIAGNOSIS — I70211 Atherosclerosis of native arteries of extremities with intermittent claudication, right leg: Secondary | ICD-10-CM | POA: Diagnosis not present

## 2016-11-30 DIAGNOSIS — F17200 Nicotine dependence, unspecified, uncomplicated: Secondary | ICD-10-CM | POA: Diagnosis not present

## 2016-11-30 DIAGNOSIS — I2510 Atherosclerotic heart disease of native coronary artery without angina pectoris: Secondary | ICD-10-CM | POA: Diagnosis not present

## 2016-11-30 DIAGNOSIS — Z7902 Long term (current) use of antithrombotics/antiplatelets: Secondary | ICD-10-CM | POA: Diagnosis not present

## 2016-11-30 DIAGNOSIS — I739 Peripheral vascular disease, unspecified: Secondary | ICD-10-CM | POA: Diagnosis not present

## 2016-11-30 DIAGNOSIS — I10 Essential (primary) hypertension: Secondary | ICD-10-CM | POA: Insufficient documentation

## 2016-11-30 DIAGNOSIS — E785 Hyperlipidemia, unspecified: Secondary | ICD-10-CM | POA: Diagnosis not present

## 2016-11-30 DIAGNOSIS — Z955 Presence of coronary angioplasty implant and graft: Secondary | ICD-10-CM | POA: Insufficient documentation

## 2016-11-30 DIAGNOSIS — Z79899 Other long term (current) drug therapy: Secondary | ICD-10-CM | POA: Insufficient documentation

## 2016-11-30 DIAGNOSIS — Z95820 Peripheral vascular angioplasty status with implants and grafts: Secondary | ICD-10-CM | POA: Diagnosis not present

## 2016-11-30 DIAGNOSIS — Z880 Allergy status to penicillin: Secondary | ICD-10-CM | POA: Diagnosis not present

## 2016-11-30 HISTORY — PX: LOWER EXTREMITY ANGIOGRAPHY: CATH118251

## 2016-11-30 LAB — POCT I-STAT, CHEM 8
BUN: 11 mg/dL (ref 6–20)
CHLORIDE: 104 mmol/L (ref 101–111)
Calcium, Ion: 1.19 mmol/L (ref 1.15–1.40)
Creatinine, Ser: 0.7 mg/dL (ref 0.61–1.24)
Glucose, Bld: 138 mg/dL — ABNORMAL HIGH (ref 65–99)
HEMATOCRIT: 49 % (ref 39.0–52.0)
Hemoglobin: 16.7 g/dL (ref 13.0–17.0)
POTASSIUM: 4.5 mmol/L (ref 3.5–5.1)
SODIUM: 140 mmol/L (ref 135–145)
TCO2: 25 mmol/L (ref 0–100)

## 2016-11-30 LAB — POCT ACTIVATED CLOTTING TIME
ACTIVATED CLOTTING TIME: 268 s
Activated Clotting Time: 213 seconds
Activated Clotting Time: 175 seconds

## 2016-11-30 SURGERY — LOWER EXTREMITY ANGIOGRAPHY
Anesthesia: LOCAL

## 2016-11-30 MED ORDER — EZETIMIBE 10 MG PO TABS
10.0000 mg | ORAL_TABLET | Freq: Every day | ORAL | Status: DC
  Administered 2016-12-01: 10 mg via ORAL
  Filled 2016-11-30: qty 1

## 2016-11-30 MED ORDER — HEPARIN SODIUM (PORCINE) 1000 UNIT/ML IJ SOLN
INTRAMUSCULAR | Status: DC | PRN
  Administered 2016-11-30: 8000 [IU] via INTRAVENOUS

## 2016-11-30 MED ORDER — SODIUM CHLORIDE 0.9 % IV SOLN
INTRAVENOUS | Status: AC
  Administered 2016-11-30: 16:00:00 via INTRAVENOUS

## 2016-11-30 MED ORDER — CLOPIDOGREL BISULFATE 75 MG PO TABS: 75.0000 mg | ORAL_TABLET | Freq: Every day | ORAL | Status: DC

## 2016-11-30 MED ORDER — CLOPIDOGREL BISULFATE 75 MG PO TABS
75.0000 mg | ORAL_TABLET | Freq: Every day | ORAL | Status: DC
  Administered 2016-12-01: 75 mg via ORAL
  Filled 2016-11-30: qty 1

## 2016-11-30 MED ORDER — NITROGLYCERIN 1 MG/10 ML FOR IR/CATH LAB
INTRA_ARTERIAL | Status: DC | PRN
  Administered 2016-11-30: 200 ug via INTRA_ARTERIAL

## 2016-11-30 MED ORDER — LIDOCAINE HCL (PF) 1 % IJ SOLN
INTRAMUSCULAR | Status: DC | PRN
  Administered 2016-11-30: 20 mL via INTRADERMAL

## 2016-11-30 MED ORDER — HYDRALAZINE HCL 20 MG/ML IJ SOLN
INTRAMUSCULAR | Status: AC
  Filled 2016-11-30: qty 1

## 2016-11-30 MED ORDER — SODIUM CHLORIDE 0.9 % WEIGHT BASED INFUSION: 1.0000 mL/kg/h | INTRAVENOUS | Status: DC

## 2016-11-30 MED ORDER — METHYLPREDNISOLONE SODIUM SUCC 125 MG IJ SOLR
125.0000 mg | INTRAMUSCULAR | Status: AC
  Administered 2016-11-30: 125 mg via INTRAVENOUS

## 2016-11-30 MED ORDER — FAMOTIDINE IN NACL 20-0.9 MG/50ML-% IV SOLN
INTRAVENOUS | Status: AC
  Administered 2016-11-30: 20 mg
  Filled 2016-11-30: qty 50

## 2016-11-30 MED ORDER — HEPARIN SODIUM (PORCINE) 1000 UNIT/ML IJ SOLN
INTRAMUSCULAR | Status: AC
  Filled 2016-11-30: qty 1

## 2016-11-30 MED ORDER — SODIUM CHLORIDE 0.9% FLUSH: 3.0000 mL | Freq: Two times a day (BID) | INTRAVENOUS | Status: DC

## 2016-11-30 MED ORDER — HEPARIN (PORCINE) IN NACL 2-0.9 UNIT/ML-% IJ SOLN
INTRAMUSCULAR | Status: AC | PRN
  Administered 2016-11-30: 1000 mL via INTRA_ARTERIAL

## 2016-11-30 MED ORDER — FENTANYL CITRATE (PF) 100 MCG/2ML IJ SOLN
INTRAMUSCULAR | Status: AC
  Filled 2016-11-30: qty 2

## 2016-11-30 MED ORDER — IODIXANOL 320 MG/ML IV SOLN
INTRAVENOUS | Status: DC | PRN
  Administered 2016-11-30: 90 mL via INTRA_ARTERIAL

## 2016-11-30 MED ORDER — FAMOTIDINE IN NACL 20-0.9 MG/50ML-% IV SOLN: 20.0000 mg | INTRAVENOUS | Status: DC

## 2016-11-30 MED ORDER — SODIUM CHLORIDE 0.9 % IV SOLN: 250.0000 mL | INTRAVENOUS | Status: DC | PRN

## 2016-11-30 MED ORDER — FENTANYL CITRATE (PF) 100 MCG/2ML IJ SOLN
INTRAMUSCULAR | Status: DC | PRN
  Administered 2016-11-30: 25 ug via INTRAVENOUS

## 2016-11-30 MED ORDER — MIDAZOLAM HCL 2 MG/2ML IJ SOLN
INTRAMUSCULAR | Status: DC | PRN
  Administered 2016-11-30: 1 mg via INTRAVENOUS

## 2016-11-30 MED ORDER — HEPARIN (PORCINE) IN NACL 2-0.9 UNIT/ML-% IJ SOLN
INTRAMUSCULAR | Status: AC
  Filled 2016-11-30: qty 1000

## 2016-11-30 MED ORDER — SODIUM CHLORIDE 0.9 % IV SOLN
INTRAVENOUS | Status: AC | PRN
  Administered 2016-11-30: 500 mL via INTRAVENOUS

## 2016-11-30 MED ORDER — SODIUM CHLORIDE 0.9% FLUSH: 3.0000 mL | INTRAVENOUS | Status: DC | PRN

## 2016-11-30 MED ORDER — ACETAMINOPHEN 325 MG PO TABS: 650.0000 mg | ORAL_TABLET | ORAL | Status: DC | PRN

## 2016-11-30 MED ORDER — SODIUM CHLORIDE 0.9 % WEIGHT BASED INFUSION
3.0000 mL/kg/h | INTRAVENOUS | Status: DC
  Administered 2016-11-30: 3 mL/kg/h via INTRAVENOUS

## 2016-11-30 MED ORDER — HYDRALAZINE HCL 20 MG/ML IJ SOLN: 10.0000 mg | INTRAMUSCULAR | Status: DC | PRN

## 2016-11-30 MED ORDER — ONDANSETRON HCL 4 MG/2ML IJ SOLN: 4.0000 mg | Freq: Four times a day (QID) | INTRAMUSCULAR | Status: DC | PRN

## 2016-11-30 MED ORDER — LIDOCAINE HCL (PF) 1 % IJ SOLN
INTRAMUSCULAR | Status: AC
  Filled 2016-11-30: qty 30

## 2016-11-30 MED ORDER — VIPERSLIDE LUBRICANT OPTIME
TOPICAL | Status: DC | PRN
  Administered 2016-11-30: 3 mL via SURGICAL_CAVITY

## 2016-11-30 MED ORDER — DIPHENHYDRAMINE HCL 50 MG/ML IJ SOLN
25.0000 mg | INTRAMUSCULAR | Status: AC
  Administered 2016-11-30: 25 mg via INTRAVENOUS

## 2016-11-30 MED ORDER — METHYLPREDNISOLONE SODIUM SUCC 125 MG IJ SOLR
INTRAMUSCULAR | Status: AC
  Filled 2016-11-30: qty 2

## 2016-11-30 MED ORDER — NITROGLYCERIN 1 MG/10 ML FOR IR/CATH LAB
INTRA_ARTERIAL | Status: AC
  Filled 2016-11-30: qty 10

## 2016-11-30 MED ORDER — SIMVASTATIN 40 MG PO TABS
40.0000 mg | ORAL_TABLET | Freq: Every day | ORAL | Status: DC
  Administered 2016-12-01: 40 mg via ORAL
  Filled 2016-11-30: qty 1

## 2016-11-30 MED ORDER — MIDAZOLAM HCL 2 MG/2ML IJ SOLN
INTRAMUSCULAR | Status: AC
  Filled 2016-11-30: qty 2

## 2016-11-30 MED ORDER — DIPHENHYDRAMINE HCL 50 MG/ML IJ SOLN
INTRAMUSCULAR | Status: AC
  Administered 2016-11-30: 25 mg via INTRAVENOUS
  Filled 2016-11-30: qty 1

## 2016-11-30 MED ORDER — LISINOPRIL 5 MG PO TABS: 5.0000 mg | ORAL_TABLET | Freq: Every day | ORAL | Status: DC

## 2016-11-30 MED ORDER — BUPROPION HCL ER (SR) 150 MG PO TB12
150.0000 mg | ORAL_TABLET | Freq: Two times a day (BID) | ORAL | Status: DC
  Administered 2016-11-30 – 2016-12-01 (×2): 150 mg via ORAL
  Filled 2016-11-30 (×2): qty 1

## 2016-11-30 SURGICAL SUPPLY — 18 items
CATH CROSS OVER TEMPO 5F (CATHETERS) ×1 IMPLANT
CATH VIANCE CROSS STAND 150CM (MICROCATHETER) ×3
CATH VIANCE CROSS STD 150CM (MICROCATHETER) IMPLANT
GUIDEWIRE ANGLED .035X150CM (WIRE) ×1 IMPLANT
GUIDEWIRE ASTATO XS 20G 300CM (WIRE) ×1 IMPLANT
KIT ENCORE 26 ADVANTAGE (KITS) ×1 IMPLANT
KIT PV (KITS) ×3 IMPLANT
SHEATH HIGHFLEX ANSEL 7FR 55CM (SHEATH) ×1 IMPLANT
SHEATH PINNACLE 5F 10CM (SHEATH) ×1 IMPLANT
SHEATH PINNACLE 7F 10CM (SHEATH) ×1 IMPLANT
SYRINGE MEDRAD AVANTA MACH 7 (SYRINGE) ×1 IMPLANT
TAPE RADIOPAQUE TURBO (MISCELLANEOUS) ×1 IMPLANT
TRANSDUCER W/STOPCOCK (MISCELLANEOUS) ×3 IMPLANT
TRAY PV CATH (CUSTOM PROCEDURE TRAY) ×3 IMPLANT
TUBING CIL FLEX 10 FLL-RA (TUBING) ×1 IMPLANT
WIRE HITORQ VERSACORE ST 145CM (WIRE) ×1 IMPLANT
WIRE ROSEN-J .035X260CM (WIRE) ×1 IMPLANT
WIRE SPARTACORE .014X300CM (WIRE) ×1 IMPLANT

## 2016-11-30 NOTE — Progress Notes (Signed)
Pressure held to left groin for 30 minutes from 1345-1415.  Patient in stable condition.  Site returned to stable at level 1 until the next check at 1400 and then pressure held for an additional 20 minutes. Dr. Gwenlyn Found notified of first time pressure was being held then [place] PA notified the second time.  Site is a level 1 currently.  Patient is in stable condition.  Groin checks and monitoring to continue.

## 2016-11-30 NOTE — Progress Notes (Signed)
Report received from [facility].  Pt waiting a bed assignment from admitting

## 2016-11-30 NOTE — Progress Notes (Signed)
    Pt is post attempted PV intervention to the R SFA. While in short stay patient had 2 episodes of bleeding from left groin site. Site now controlled, but will admit overnight for observation and reassess labs in the morning. Dr. Gwenlyn Found notified.   SignedReino Bellis, NP-C 11/30/2016, 3:09 PM Pager: [phone]

## 2016-11-30 NOTE — Progress Notes (Signed)
Per Dr. Gwenlyn Found, needs f/u visit in 2-3 weeks for f/u of aborted R SFA intervention. Upon review of chart, pt has f/u duplex pre-scheduled 8/16 and an appointment with Dr. Debara Pickett for lipids. Dr. Gwenlyn Found recommends to cancel the duplex and arrange f/u with him 2-3 weeks (and keep lipid appointment with Dr. Debara Pickett). There was no availability with Dr. Gwenlyn Found in requested timeframe nor with APP on his team but was able to schedule with Bernerd Pho on 12/19/16 at 8:30am on a day when Dr. Gwenlyn Found is in clinic as well.  Notified short stay to pass onto patient and put info on AVS.  Eran Mistry PA-C

## 2016-11-30 NOTE — Progress Notes (Signed)
Per Dr. Gwenlyn Found, needs f/u visit in 2-3 weeks for f/u of aborted R SFA intervention. Upon review of chart, pt has f/u duplex pre-scheduled 8/16 and an appointment with Dr. Debara Pickett for lipids. Dr. Gwenlyn Found recommends to cancel the duplex and arrange f/u. No availability with Dr. Gwenlyn Found in requested timeframe nor with APP on his team but was able to schedule with Bernerd Pho on 12/19/16 at 8:30am on a day when Dr. Gwenlyn Found is in clinic as well.  Notified short stay to pass onto patient. Niyati Heinke PA-C

## 2016-11-30 NOTE — Telephone Encounter (Signed)
Pt had PV Angio today and is currently admitted

## 2016-11-30 NOTE — Progress Notes (Signed)
Site area: lt groin fa sheath pulled and pressure held by [employer] Prior to Removal:  Level 0 Pressure Applied For: 45 minutes Manual:   yes Patient Status During Pull:  stable Post Pull Site:  Level 0 Post Pull Instructions Given:  yes Post Pull Pulses Present: dopplered Dressing Applied:  Gauze and tegaderm Bedrest begins @ 7903 Comments:

## 2016-11-30 NOTE — H&P (View-Only) (Signed)
Brady Brooks returns today to discuss his upcoming procedure already scheduled for 11/30/16. I performed stenting of his proximal left common iliac artery 10/30/16. He has a previously placed, widely patent, right common iliac artery stent and bilaterally occluded SFAs over a long segment which appear calcified. He is more symptomatic on the right side. My intent is to perform endovascular therapy of his right SFA CTO.

## 2016-11-30 NOTE — Interval H&P Note (Signed)
History and Physical Interval Note:  11/30/2016 9:52 AM  Brady Brooks Sr.  has presented today for surgery, with the diagnosis of claudication  The various methods of treatment have been discussed with the patient and family. After consideration of risks, benefits and other options for treatment, the patient has consented to  Procedure(s): Lower Extremity Angiography (N/A) as a surgical intervention .  The patient's history has been reviewed, patient examined, no change in status, stable for surgery.  I have reviewed the patient's chart and labs.  Questions were answered to the patient's satisfaction.     [name]

## 2016-11-30 NOTE — Discharge Instructions (Signed)

## 2016-11-30 NOTE — Progress Notes (Signed)
Site area: lt groin Site Prior to Removal:  Level  0 Pressure Applied For: 45 minutes Manual: yes   Patient Status During Pull:  awake Post Pull Site:  Level 0 Post Pull Instructions Given:   yes Post Pull Pulses Present: lt pt and dp are papable Dressing Applied:  yes Bedrest begins @ 12:45 Comments: held pressure for 45 minutes for due to ozzing

## 2016-12-01 ENCOUNTER — Encounter (HOSPITAL_COMMUNITY): Payer: Self-pay | Admitting: Cardiovascular Disease

## 2016-12-01 DIAGNOSIS — I739 Peripheral vascular disease, unspecified: Secondary | ICD-10-CM | POA: Diagnosis not present

## 2016-12-01 DIAGNOSIS — E782 Mixed hyperlipidemia: Secondary | ICD-10-CM | POA: Diagnosis not present

## 2016-12-01 DIAGNOSIS — I2510 Atherosclerotic heart disease of native coronary artery without angina pectoris: Secondary | ICD-10-CM | POA: Diagnosis not present

## 2016-12-01 DIAGNOSIS — Z95820 Peripheral vascular angioplasty status with implants and grafts: Secondary | ICD-10-CM | POA: Diagnosis not present

## 2016-12-01 DIAGNOSIS — E785 Hyperlipidemia, unspecified: Secondary | ICD-10-CM | POA: Diagnosis not present

## 2016-12-01 DIAGNOSIS — Z955 Presence of coronary angioplasty implant and graft: Secondary | ICD-10-CM | POA: Diagnosis not present

## 2016-12-01 DIAGNOSIS — I10 Essential (primary) hypertension: Secondary | ICD-10-CM | POA: Diagnosis not present

## 2016-12-01 LAB — BASIC METABOLIC PANEL
Anion gap: 6 (ref 5–15)
BUN: 15 mg/dL (ref 6–20)
CHLORIDE: 109 mmol/L (ref 101–111)
CO2: 24 mmol/L (ref 22–32)
CREATININE: 0.89 mg/dL (ref 0.61–1.24)
Calcium: 8.6 mg/dL — ABNORMAL LOW (ref 8.9–10.3)
GFR calc non Af Amer: >60 mL/min (ref >60)
Glucose, Bld: 137 mg/dL — ABNORMAL HIGH (ref 65–99)
POTASSIUM: 4.5 mmol/L (ref 3.5–5.1)
Sodium: 139 mmol/L (ref 135–145)

## 2016-12-01 LAB — CBC
HEMATOCRIT: 41.7 % (ref 39.0–52.0)
HEMOGLOBIN: 14 g/dL (ref 13.0–17.0)
MCH: 33.2 pg (ref 26.0–34.0)
MCHC: 33.6 g/dL (ref 30.0–36.0)
MCV: 98.8 fL (ref 78.0–100.0)
PLATELETS: 205 10*3/uL (ref 150–400)
RBC: 4.22 MIL/uL (ref 4.22–5.81)
RDW: 13.3 % (ref 11.5–15.5)
WBC: 18.1 10*3/uL — ABNORMAL HIGH (ref 4.0–10.5)

## 2016-12-01 MED ORDER — LISINOPRIL 5 MG PO TABS: 5.0000 mg | ORAL_TABLET | Freq: Every day | ORAL | 6 refills | Status: DC

## 2016-12-01 NOTE — Progress Notes (Signed)
Discharged home with family, self care. 

## 2016-12-01 NOTE — Discharge Summary (Addendum)
Discharge Summary    Patient ID: Brady Brooks.,  MRN: [id number], DOB/AGE: April 15, 1951 66 y.o.  Admit date: 11/30/2016 Discharge date: 12/01/2016  Primary Care Provider: Glendon Axe Primary Cardiologist: Dr.Berry  Discharge Diagnoses    Active Problems:   Claudication in peripheral vascular disease [facility])   PVD (peripheral vascular disease) ([place])   CAD   HTn   HLD  ongoing tobacco abuse   Allergies Allergies  Allergen Reactions  . Aspirin Other (See Comments)    Sneezes, watery nose, wheeze (no polyps)   . Contrast Media [Iodinated Diagnostic Agents]     Need to be pre-medicated, sneezing, watery eyes  . Erythromycin Hives    Childhood allergy All mycin drugs   . Penicillins     Childhood allergy Has patient had a PCN reaction causing immediate rash, facial/tongue/throat swelling, SOB or lightheadedness with hypotension: Unknown Has patient had a PCN reaction causing severe rash involving mucus membranes or skin necrosis: Unknown Has patient had a PCN reaction that required hospitalization: No Has patient had a PCN reaction occurring within the last 10 years: No States he has had benadryl when taking this and had no problems recently    . Sulfa Antibiotics     unknown    Diagnostic Studies/Procedures    PV Angiogram/Intervention 11/30/16 Procedures Performed:            1. Contralateral access (second order catheter placement)            2. Failed attempt at right SFA intervention using a [place] catheter  Final Impression: Unsuccessful attempt at right SFA CTO intervention for lifestyle limiting claudication. At this point, we will continue to treat him medically. The sheath will be removed once the ACT falls below 170 pressure held. The patient was gently hydrated and discharged home later today to outpatient. I will see him back in the office several weeks for follow-up.   History of Present Illness     Brady Brooks. is a 66 y.o. male  with past medical history of CAD (s/p stent to RCA in 2005 with known chronically occluded LCx), PVD (s/p right iliac stent in 2001, s/p PCI of CTO of RICA in 2015), HTN, HLD, and continued tobacco use who presented 11/30/16 for scheuled peripheral angiography.   He was examined by Dr. Gwenlyn Found in 08/2016 and reported lower extremity pain consistent with claudication. Doppler studies were obtained and showed a decline in his left ABI from 0.76 to 0.59 with a new occlusion of the left SFA. He presented to [facility]2016 for the procedure and underwent successful left common iliac PTA and stenting. He had moderate bilateral SFA occlusions and it was recommended he follow-up to discuss staged SFA intervention. He was placed only on Plavix at the time of discharge with his known intolerance to ASA.    Repeat dopplers on 11/14/2016 showed bilateral ABI's are in the moderate range, with no improvement since pre-common iliac stent on the left. Dopplers showed aorto-iliac atherosclerosis, with >50% bilateral common and right external iliac artery stenosis, low end of range, s/p bilateral common iliac stents.  Seen by Dr. Gwenlyn Found 11/21/16 for staged PCI of right SFA.   Hospital Course     Consultants: None  Patient presented for scheduled angiogram. Unsuccessful attempt at right SFA CTO intervention for lifestyle limiting claudication. Plan to treat medically. Pt is post attempted PV intervention to the R SFA. While in short stay patient had 2 episodes  of bleeding from left groin site. Admitted overnight. No further complication or bleeding overnight. Hemoglobin and Scr are stable. Cath site looks good.   The patient has been seen by Dr. Irish Lack today and deemed ready for discharge home. All follow-up appointments have been scheduled. Discharge medications are listed below.  _____________   Discharge Vitals Blood pressure 140/61, pulse (!) 57, temperature 98.2 F (36.8 C), temperature source Oral, resp.  rate 17, height 5\' 10"  (1.778 m), weight 209 lb 12.8 oz (95.2 kg), SpO2 100 %.  Filed Weights   11/30/16 0750 12/01/16 0509  Weight: 210 lb (95.3 kg) 209 lb 12.8 oz (95.2 kg)    Labs & Radiologic Studies     CBC  Recent Labs  11/30/16 0828 12/01/16 0253  WBC  --  18.1*  HGB 16.7 14.0  HCT 49.0 41.7  MCV  --  98.8  PLT  --  654   Basic Metabolic Panel  Recent Labs  11/30/16 0828 12/01/16 0253  NA 140 139  K 4.5 4.5  CL 104 109  CO2  --  24  GLUCOSE 138* 137*  BUN 11 15  CREATININE 0.70 0.89  CALCIUM  --  8.6*     Disposition   Pt is being discharged home today in good condition.  Follow-up Plans & Appointments    Follow-up Information    Erma Heritage, PA-C Follow up.   Specialties:  Physician Assistant, Cardiology Why:  12/19/16 at 8:30am - arrive 15 minutes early to check in. [place] is a PA that works closely with Dr. Gwenlyn Found. Your 12/14/16 follow-up doppler studies were cancelled since you did not have intervention. Also, keep follow-up with Dr. Debara Pickett as scheduled. Contact information: [address] [place] [address] [phone]          Discharge Instructions    Diet - low sodium heart healthy    Complete by:  As directed    Discharge instructions    Complete by:  As directed    No driving for 48 hours. No lifting over 5 lbs for 1 week. No sexual activity for 1 week. You may return to work on 12/06/16. Keep procedure site clean & dry. If you notice increased pain, swelling, bleeding or pus, call/return!  You may shower, but no soaking baths/hot tubs/pools for 1 week.   Increase activity slowly    Complete by:  As directed       Discharge Medications   Current Discharge Medication List    CONTINUE these medications which have CHANGED   Details  lisinopril (PRINIVIL,ZESTRIL) 5 MG tablet Take 1 tablet (5 mg total) by mouth daily. Qty: 30 tablet, Refills: 6      CONTINUE these medications which have NOT CHANGED    Details  albuterol (PROVENTIL HFA;VENTOLIN HFA) 108 (90 Base) MCG/ACT inhaler Inhale 2 puffs into the lungs every 6 (six) hours as needed for wheezing or shortness of breath. Qty: 3 Inhaler, Refills: 3    buPROPion (WELLBUTRIN SR) 150 MG 12 hr tablet Take 1 tablet (150 mg total) by mouth 2 (two) times daily. Qty: 60 tablet, Refills: 3    clopidogrel (PLAVIX) 75 MG tablet Take 1 tablet (75 mg total) by mouth daily. Qty: 90 tablet, Refills: 3    Coenzyme Q10 (COQ10) 100 MG CAPS Take 1 capsule by mouth daily.    ezetimibe (ZETIA) 10 MG tablet Take 1 tablet (10 mg total) by mouth daily. Qty: 30 tablet, Refills: 11    fexofenadine (  ALLEGRA) 180 MG tablet Take 180 mg by mouth daily.     simvastatin (ZOCOR) 40 MG tablet Take 1 tablet (40 mg total) by mouth daily. Qty: 30 tablet, Refills: 11    triamcinolone (NASACORT ALLERGY 24HR) 55 MCG/ACT AERO nasal inhaler Place 2 sprays into the nose daily as needed (allergies).    triamcinolone ointment (KENALOG) 0.1 % Apply 1 application topically 2 (two) times daily as needed (rash).          Outstanding Labs/Studies   None  Duration of Discharge Encounter   Greater than 30 minutes including physician time.  Signed, Bhagat,Bhavinkumar PA-C 12/01/2016, 11:40 AM  I have examined the patient and reviewed assessment and plan and discussed with patient.  Agree with above as stated.  Patient with bruised left groin.  Intact left femoral pulse.  No bruit.  Dopplerable pulse in the left foot. No edema.  RRR, no wheezing.  Hgb stable.  Continue management of HTN and hyperlipidemia.    Left groin hematoma, now soft.  Bruising may spread but inform office if there is a hard place or pain.  Larae Grooms

## 2016-12-14 ENCOUNTER — Encounter (HOSPITAL_COMMUNITY): Payer: Medicare Other

## 2016-12-17 NOTE — Progress Notes (Signed)
Cardiology Office Note    Date:  12/19/2016   ID:  Brady Crumb Sr., DOB 02-22-1951, MRN [id number]  PCP:  Glendon Axe, MD  Cardiologist: Dr. Gwenlyn Found    Chief Complaint  Patient presents with  . Follow-up    recent PV Angiography    History of Present Illness:    Brady Retzloff. is a 66 y.o. male with past medical history of CAD (s/p stent to RCA in 2005 with known chronically occluded LCx), PVD (s/p right iliac stent in 2001, s/p PCI of CTO of RICA in 2015, s/p stenting of left common iliac in 10/2016), HTN, HLD, and continued tobacco use who presents to the office today for follow-up of his recent peripheral angiography.   He was last examined by myself in 10/2016 for follow-up of recent lower extremity angiography which showed moderate bilateral SFA occlusions and it was recommended he follow-up to discuss staged right SFA intervention. This was reviewed with Dr. Gwenlyn Found who recommended proceeding with the procedure. He presented [facility]2016 for the procedure but it was unsuccessful as Dr. Gwenlyn Found was unable to cross the lesion. Continued medical therapy was therefore recommended.    In talking with the patient today, he reports overall doing well since his recent procedure. He did experience bruising along his right groin site but this has since resolved. He experiences mild pain and cramping along his right calf but this is not always worse with activity. No pain along his left leg. He denies any recent chest pain, palpitations, dyspnea on exertion, orthopnea, PND, or lower extremity edema.  His last lipid panel was in 10/2016 and showed total cholesterol of 143, HDL 58, and LDL of 63. He reports good compliance with Simvastatin and Zetia as he is tolerating these well. Unsure if he was on Crestor or Atorvastatin in the past but does say he "cold not be on one of them".  BP is elevated at 173/92 today. He had been prescribed Lisinopril 5 mg daily but has been  unable to take this due to the side effect of a dry cough. When he was taking the medication his SBP was in the 130's. He asks for an alternative to this as he was on Valsartan prior to Lisinopril.    Past Medical History:  Diagnosis Date  . COPD (chronic obstructive pulmonary disease) ([place])   . Coronary artery disease    a. s/p RCA stenting back in 2005 with a known chronically occluded circumflex and normal LV function.  . Emphysema lung ([place])   . GERD (gastroesophageal reflux disease)   . Heart attack ([place]) <2005  . History of tobacco abuse   . Hyperlipidemia   . Hypertension   . Lung nodule    a. Suspicious for malignancy, undergoing workup.  . Obesity   . Peripheral arterial disease ([place])    a. history of stent to right common iliac artery 12/15/99 with a peak for balloon-expandable stent. b. s/p intervention on RCIA total occlusion PTA and stent, residual disease on the right for possible staged intervention, notable disease on the left but asymptomatic in 2015. c. 10/2016: s/p PTA and stenting of left common iliac    Past Surgical History:  Procedure Laterality Date  . CARDIAC CATHETERIZATION  10/26/1999   Archie Endo 09/13/2010  . Cardiolite study     59% ejection fraction and negative for ischemia  . CORONARY ANGIOPLASTY WITH STENT PLACEMENT  2005   Taxus stent placed to his RCA   .  HERNIA REPAIR    . ILIAC ARTERY STENT Right 12/15/1999   a. history of stent to right common iliac artery 12/15/99 with a peak for balloon-expandable stent. b. s/p intervention on RCIA total occlusion PTA and stent, residual disease on the right for possible staged intervention, notable disease on the left but asymptomatic.  Marland Kitchen ILIAC VEIN ANGIOPLASTY / STENTING Right 03/16/2014   rt common iliac    by dr berry  . LAPAROSCOPIC CHOLECYSTECTOMY  1990s  . LOWER EXTREMITY ANGIOGRAM N/A 03/16/2014   Procedure: LOWER EXTREMITY ANGIOGRAM;  Surgeon: [name], MD;  Location: [facility] CATH LAB;  Service:  Cardiovascular;  Laterality: N/A;  . LOWER EXTREMITY ANGIOGRAPHY N/A 10/30/2016   Procedure: Lower Extremity Angiography;  Surgeon: [name], MD;  Location: [facility] CV LAB;  Service: Cardiovascular;  Laterality: N/A;  . LOWER EXTREMITY ANGIOGRAPHY N/A 11/30/2016   Procedure: Lower Extremity Angiography;  Surgeon: [name], MD;  Location: [place] [facility] CV LAB;  Service: Cardiovascular;  Laterality: N/A;  . PERIPHERAL VASCULAR INTERVENTION Left 10/30/2016   Procedure: Peripheral Vascular Intervention;  Surgeon: [name], MD;  Location: [facility] CV LAB;  Service: Cardiovascular;  Laterality: Left;  Lt Com ILIAC  . TONSILLECTOMY AND ADENOIDECTOMY  1950s  . UMBILICAL HERNIA REPAIR  2012    Current Medications: Outpatient Medications Prior to Visit  Medication Sig Dispense Refill  . albuterol (PROVENTIL HFA;VENTOLIN HFA) 108 (90 Base) MCG/ACT inhaler Inhale 2 puffs into the lungs every 6 (six) hours as needed for wheezing or shortness of breath. 3 Inhaler 3  . buPROPion (WELLBUTRIN SR) 150 MG 12 hr tablet Take 1 tablet (150 mg total) by mouth 2 (two) times daily. 60 tablet 3  . clopidogrel (PLAVIX) 75 MG tablet Take 1 tablet (75 mg total) by mouth daily. 90 tablet 3  . Coenzyme Q10 (COQ10) 100 MG CAPS Take 1 capsule by mouth daily.    Marland Kitchen ezetimibe (ZETIA) 10 MG tablet Take 1 tablet (10 mg total) by mouth daily. 30 tablet 11  . fexofenadine (ALLEGRA) 180 MG tablet Take 180 mg by mouth daily.     . simvastatin (ZOCOR) 40 MG tablet Take 1 tablet (40 mg total) by mouth daily. 30 tablet 11  . triamcinolone (NASACORT ALLERGY 24HR) 55 MCG/ACT AERO nasal inhaler Place 2 sprays into the nose daily as needed (allergies).    . triamcinolone ointment (KENALOG) 0.1 % Apply 1 application topically 2 (two) times daily as needed (rash).    Marland Kitchen lisinopril (PRINIVIL,ZESTRIL) 5 MG tablet Take 1 tablet (5 mg total) by mouth daily. 30 tablet 6   No facility-administered medications prior to  visit.      Allergies:   Aspirin; Contrast media [iodinated diagnostic agents]; Erythromycin; Penicillins; and Sulfa antibiotics   Social History   Social History  . Marital status: Married    Spouse name: Brady Brooks  . Number of children: 2  . Years of education: N/A   Occupational History  . textile manf    Social History Main Topics  . Smoking status: Current Every Day Smoker    Packs/day: 1.00    Years: 50.00    Types: Cigarettes  . Smokeless tobacco: Never Used  . Alcohol use Yes     Comment: 10/30/2016 "might have a few drinks 4-5 times/year"  . Drug use: No  . Sexual activity: Not Currently   Other Topics Concern  . None   Social History Narrative  . None     Family History:  The patient's family history includes Asthma in his son; CAD in his brother and brother; Heart disease in his father; Lung cancer in his paternal grandfather.   Review of Systems:   Please see the history of present illness.     General:  No chills, fever, night sweats or weight changes.  Cardiovascular:  No chest pain, dyspnea on exertion, edema, orthopnea, palpitations, paroxysmal nocturnal dyspnea. Positive for right leg pain.  Dermatological: No rash, lesions/masses Respiratory: No cough, dyspnea Urologic: No hematuria, dysuria Abdominal:   No nausea, vomiting, diarrhea, bright red blood per rectum, melena, or hematemesis Neurologic:  No visual changes, wkns, changes in mental status. All other systems reviewed and are otherwise negative except as noted above.   Physical Exam:    VS:  BP (!) 173/92   Pulse 96   Ht 5\' 10"  (1.778 m)   Wt 206 lb 12.8 oz (93.8 kg)   BMI 29.67 kg/m    General: Well developed, well nourished Caucasian male appearing in no acute distress. Head: Normocephalic, atraumatic, sclera non-icteric, no xanthomas, nares are without discharge.  Neck: No carotid bruits. JVD not elevated.  Lungs: Respirations regular and unlabored, without wheezes or rales.  Heart:  Regular rate and rhythm. No S3 or S4.  No murmur, no rubs, or gallops appreciated. Abdomen: Soft, non-tender, non-distended with normoactive bowel sounds. No hepatomegaly. No rebound/guarding. No obvious abdominal masses. Msk:  Strength and tone appear normal for age. No joint deformities or effusions. Extremities: No clubbing or cyanosis. No edema.  Distal pedal pulses 2+ on right, 1+ on left. Groin site without ecchymosis or evidence of a hematoma.  Neuro: Alert and oriented X 3. Moves all extremities spontaneously. No focal deficits noted. Psych:  Responds to questions appropriately with a normal affect. Skin: No rashes or lesions noted  Wt Readings from Last 3 Encounters:  12/19/16 206 lb 12.8 oz (93.8 kg)  12/01/16 209 lb 12.8 oz (95.2 kg)  11/21/16 206 lb (93.4 kg)     Studies/Labs Reviewed:   EKG:  EKG is not ordered today.   Recent Labs: 09/12/2016: ALT 20; TSH 0.87 12/01/2016: BUN 15; Creatinine, Ser 0.89; Hemoglobin 14.0; Platelets 205; Potassium 4.5; Sodium 139   Lipid Panel    Component Value Date/Time   CHOL 143 11/14/2016 0000   TRIG 108 11/14/2016 0000   HDL 58 11/14/2016 0000   CHOLHDL 2.5 11/14/2016 0000   CHOLHDL 5.5 (H) 09/12/2016 0821   VLDL 46 (H) 09/12/2016 0821   LDLCALC 63 11/14/2016 0000    Additional studies/ records that were reviewed today include:   Lower Extremity Angiography: 11/30/2016 Pre Procedure Diagnosis: Claudication  Post Procedure Diagnosis: Claudication  Operators: Dr. [name]  Procedures Performed:            1. Contralateral access (second order catheter placement)            2. Failed attempt at right SFA intervention using a [place] catheter              PROCEDURE DESCRIPTION:   The patient was brought to the second floor [facility] cath lab in the the postabsorptive state. He was premedicated with Valium 5 mg by mouth, IV Versed and fentanyl. His left groin was prepped and shaved in usual sterile  fashion. Xylocaine 1% was used for local anesthesia. A 7 French sheath was inserted into the left common femoral  artery using standard Seldinger technique. Contralateral access was obtained with a 5 [place] crossover  catheter, guidewire, Rosen wire and 7 [place] multipurpose 55 cm Ansel sheath.  The patient received 8000 units of heparin with ACT of 268. A total of 90 mL of contrast was used during the procedure. I attempted to cross the right SFA CTO with a Viance CTO catheter and a 14 [place] core wire. I did cross the proximal But then was unable to manipulate the Viance catheter and when attempting to withdraw was unable to. I gave intra-arterial nitroglycerin followed by "viper slide" which after several minutes of gentle counterpressure, the catheter yielded. I aborted the procedure after this. The Ansel sheath was then withdrawn across the bifurcation and exchanged over a 035 wire for a short 7 [place] sheath. This was secured in place and the patient left the lab in stable condition.   Final Impression: Unsuccessful attempt at right SFA CTO intervention for lifestyle limiting claudication. At this point, we will continue to treat him medically. The sheath will be removed once the ACT falls below 170 pressure held. The patient was gently hydrated and discharged home later today to outpatient. I will see him back in the office several weeks for follow-up.   Assessment:    1. Peripheral arterial disease ([place])   2. Coronary artery disease involving native coronary artery of native heart without angina pectoris   3. Essential hypertension   4. Hyperlipidemia LDL goal <70   5. Tobacco abuse      Plan:   In order of problems listed above:  1. Peripheral Arterial Disease - s/p right iliac stent in 2001, s/p PCI of CTO of RICA in 2015 with recent stenting of left common iliac in 10/2016. Had been referred for staged right SFA CTO intervention but it was unsuccessful as Dr. Gwenlyn Found was unable to  cross the lesion. Continued medical therapy was therefore recommended.  - his groin site appears well-healing with no ecchymosis or evidence of a hematoma.  - continue Plavix, statin, and Zetia. Complete smoking cessation advised. Will start Losartan as below for improved BP control.   2. CAD - s/p stent to RCA in 2005 with known chronically occluded LCx.  - continue Plavix, statin, and Zetia.   3. HTN - BP is elevated at 173/92 during today's visit but he self-discontinued his Lisinopril due to the side-effect of a dry cough. Previously on Valsartan but will not restart this with the recent recall. Start Losartan 25mg  daily. He will check BP readings at home and report back with results via the phone or MyChart. Continue to titrate dosing if needed.   4. HLD - Lipid Panel on 11/14/16 showed improved numbers with total cholesterol of 143, HDL 58, and LDL 63. He had been referred to [facility] but I reviewed these numbers with Dr. Debara Pickett and he recommended continuing his current regimen at this time as he is at goal and LDL drastically improved from 106 to 66 with implementation of Simvastatin and Zetia. Recheck FLP/LFT's in 3 months. If LDL still borderline, consider switching to Crestor or Atorvastatin but he reports being intolerant to one of these in the past (unsure of which).   5. Tobacco Use - has reduced his use to 3-4 cigarettes per day. Complete cessation advised.    Medication Adjustments/Labs and Tests Ordered: Current medicines are reviewed at length with the patient today.  Concerns regarding medicines are outlined above.  Medication changes, Labs and Tests ordered today are listed in the Patient Instructions below.  Patient Instructions  Medication Instructions:  STOP LISINOPRIL START LOSARTAN 25MG  DAILY If you need a refill on your cardiac medications before your next appointment, please call your pharmacy.  Follow-Up: Your physician wants you to follow-up in: 3  MONTHS WITH DR Gwenlyn Found   Thank you for choosing [facility]!!      Signed, Erma Heritage, PA-C  12/19/2016 11:25 AM    [address], [place], [place]  [address] Phone: [phone]; Fax: [phone]  [address], [place], [place] [address] Phone: [phone]

## 2016-12-19 ENCOUNTER — Ambulatory Visit (INDEPENDENT_AMBULATORY_CARE_PROVIDER_SITE_OTHER): Payer: Medicare Other | Admitting: Student

## 2016-12-19 ENCOUNTER — Encounter: Payer: Self-pay | Admitting: Student

## 2016-12-19 VITALS — BP 173/92 | HR 96 | Ht 70.0 in | Wt 206.8 lb

## 2016-12-19 DIAGNOSIS — Z720 Tobacco use: Secondary | ICD-10-CM | POA: Diagnosis not present

## 2016-12-19 DIAGNOSIS — I739 Peripheral vascular disease, unspecified: Secondary | ICD-10-CM | POA: Diagnosis not present

## 2016-12-19 DIAGNOSIS — I10 Essential (primary) hypertension: Secondary | ICD-10-CM

## 2016-12-19 DIAGNOSIS — I2510 Atherosclerotic heart disease of native coronary artery without angina pectoris: Secondary | ICD-10-CM

## 2016-12-19 DIAGNOSIS — E785 Hyperlipidemia, unspecified: Secondary | ICD-10-CM

## 2016-12-19 MED ORDER — LOSARTAN POTASSIUM 25 MG PO TABS: 25.0000 mg | ORAL_TABLET | Freq: Every day | ORAL | 3 refills | Status: DC

## 2016-12-19 NOTE — Patient Instructions (Signed)
Medication Instructions:  STOP LISINOPRIL START LOSARTAN 25MG  DAILY If you need a refill on your cardiac medications before your next appointment, please call your pharmacy.  Follow-Up: Your physician wants you to follow-up in: 3 MONTHS WITH DR Barton Dubois  Thank you for choosing [facility]!!

## 2016-12-20 ENCOUNTER — Ambulatory Visit: Payer: Medicare Other | Admitting: Internal Medicine

## 2016-12-20 ENCOUNTER — Ambulatory Visit: Payer: Medicare Other | Admitting: Cardiovascular Disease

## 2016-12-26 ENCOUNTER — Encounter: Payer: Self-pay | Admitting: Family Medicine

## 2016-12-26 ENCOUNTER — Ambulatory Visit (INDEPENDENT_AMBULATORY_CARE_PROVIDER_SITE_OTHER): Payer: Medicare Other | Admitting: Family Medicine

## 2016-12-26 VITALS — BP 130/76 | HR 93 | Temp 98.4°F | Ht 70.0 in | Wt 208.8 lb

## 2016-12-26 DIAGNOSIS — Z23 Encounter for immunization: Secondary | ICD-10-CM | POA: Diagnosis not present

## 2016-12-26 DIAGNOSIS — F17219 Nicotine dependence, cigarettes, with unspecified nicotine-induced disorders: Secondary | ICD-10-CM

## 2016-12-26 DIAGNOSIS — I2510 Atherosclerotic heart disease of native coronary artery without angina pectoris: Secondary | ICD-10-CM | POA: Diagnosis not present

## 2016-12-26 DIAGNOSIS — L570 Actinic keratosis: Secondary | ICD-10-CM | POA: Diagnosis not present

## 2016-12-26 DIAGNOSIS — J449 Chronic obstructive pulmonary disease, unspecified: Secondary | ICD-10-CM | POA: Diagnosis not present

## 2016-12-26 DIAGNOSIS — D229 Melanocytic nevi, unspecified: Secondary | ICD-10-CM

## 2016-12-26 DIAGNOSIS — I10 Essential (primary) hypertension: Secondary | ICD-10-CM

## 2016-12-26 DIAGNOSIS — L821 Other seborrheic keratosis: Secondary | ICD-10-CM

## 2016-12-26 DIAGNOSIS — Z7689 Persons encountering health services in other specified circumstances: Secondary | ICD-10-CM | POA: Diagnosis not present

## 2016-12-26 NOTE — Patient Instructions (Addendum)
How to Kick the Smoking Habit   Why should I quit smoking?  Quitting smoking is the most important thing you can do for your health. Smoking can cause cancer, lung disease, heart disease, and many other health problems. Secondhand smoke can be dangerous too. It can cause lung cancer and heart disease in adults. It can make asthma worse or cause ear infections in kids.   You'll see benefits as soon as you quit smoking. Your heart rate and blood pressure will go down. You'll breathe easier. It will be easier to exercise. Your sense of smell and taste will be better. You'll lower your risk of cancer, lung disease, and heart disease. You'll even live longer!   Why is it so hard to quit smoking?  Nicotine is a strong drug. Your body becomes addicted to nicotine when you smoke. You may have withdrawal symptoms or cravings when you stop smoking. You may become anxious or irritable. You might have trouble sleeping or want to eat more. These symptoms are usually worst the first week after quitting. The good news is nicotine withdrawal symptoms only last a few weeks for most people.  The routines and habits that go along with smoking can make it tough to quit too. Some people often smoke a cigarette when they drive, after a meal, or when they're on the phone. Smoking can become a part of these routines. After you quit smoking these habits can be a trigger to make you want to smoke again. It's important to separate smoking from these routines when you quit.  How can I make it easier to quit?  You don't have to quit "cold [place]." You can double or triple the chance that you'll stop smoking if you use a medicine and counseling together. There are many medicines available. These medicines work in different ways to help manage nicotine withdrawal. Many can be bought off the shelves at your local pharmacy. Some require a prescription. Talk to your pharmacist or prescriber about what medicines may be right for  you.  It is very important to have counseling when you quit. Medicines can help you cope with nicotine withdrawal. Counseling can help you develop skills to break smoking habits. There are lots of counseling options available. Many of these are free. Some options are local support groups, telephone quitlines, online services, and texting programs.   Start thinking now about how you plan to quit. Think about why you want to quit. Look at triggers that make you want to smoke. Plan for challenges you might face when trying to quit. Talk to your pharmacist about how to get help.   Where can I learn more?  Toll-free Quitlines and Websites:  In the [place] 1-800-QUIT-NOW(1-(531) 127-6578); http://smokefree.gov    These websites include online support, live chat, and text messaging programs.     At today's visit you were encouraged to continue taking Zocor 40 mg as you need to be on some type of cholesterol medication given your health problems.  Please let us know if your have any muscle pain while on this medicine.  You were also asking about your liver function.  It looks like the last time it was checked was on 09/12/16 and was normal at this time.   A referral was placed today for you to see a Dermatologist.  The office will call you with details about this appointment.

## 2016-12-26 NOTE — Progress Notes (Addendum)
Patient presents to clinic today to establish care.  SUBJECTIVE: PMH: Pt is a 66 yo with PMH sig for  has a past medical history of COPD (chronic obstructive pulmonary disease) ([place]); Coronary artery disease; Emphysema lung (HCC); GERD (gastroesophageal reflux disease); Heart attack ([place]) (<2005); History of tobacco abuse; Hyperlipidemia; Hypertension; Lung nodule; Obesity; and Peripheral arterial disease ([place]).  Pt was formerly seen at [facility] in [place], [place] by  [facility].  Pt is followed by Pulm (Dr. Annamaria Boots) and Cards (Dr. Gwenlyn Found).  HLD: -On Simvastatin and zetia, but stopped x 1 wk  2/2 bloating. -Pt does endorse improvement in cholesterol on meds. -denies muscle pain -asks if LFTs have been checked.  HTN: -on losartan -lisinopril d/c'd 2/2 cough -checks bp at home, typically in 130s -seen by Cards, Dr. Gwenlyn Found  Skin: -pt has 3 areas of skin he is concerned about -large mole on back has doubled in size within the last yr -area on L calf pruritic  Tobacco abuse: -smoking <1 ppd, started at age 20 -trying to quit.  Now not smoking at work.  Smoking outside when home -On Bupropion 150 mg BID  COPD: -followed by Dr. Victorino Dike -Has had CT Chest to f/u nodules seen on chest xray -current smoker -on Albuterol inhaler   Allergies: PCN: crusting of lips and nose Dye: sneezing Nuts: pruritis  Surgeries:  Cholecystectomy which caused a hernia. Repaired with mesh. -see list of heart procedures below  Social Hx: -Pt manufactures textile parts.  He is married and has 2 sons.  He lives in [place].  Pt denies EtOH and drug use.  FMHx: Mom: alive 5 JME:QAST MI MGM: lived to 55 MGF: died young PGM: died young PGF: lung cancer, tobacco abuse 2 brothers: both with cardiac stents, youngest is still living, step bro-desc.   Past Medical History:  Diagnosis Date  . COPD (chronic obstructive pulmonary disease) ([place])   . Coronary artery disease    a. s/p RCA stenting  back in 2005 with a known chronically occluded circumflex and normal LV function.  . Emphysema lung ([place])   . GERD (gastroesophageal reflux disease)   . Heart attack ([place]) <2005  . History of tobacco abuse   . Hyperlipidemia   . Hypertension   . Lung nodule    a. Suspicious for malignancy, undergoing workup.  . Obesity   . Peripheral arterial disease ([place])    a. history of stent to right common iliac artery 12/15/99 with a peak for balloon-expandable stent. b. s/p intervention on RCIA total occlusion PTA and stent, residual disease on the right for possible staged intervention, notable disease on the left but asymptomatic in 2015. c. 10/2016: s/p PTA and stenting of left common iliac    Past Surgical History:  Procedure Laterality Date  . CARDIAC CATHETERIZATION  10/26/1999   Archie Endo 09/13/2010  . Cardiolite study     59% ejection fraction and negative for ischemia  . CORONARY ANGIOPLASTY WITH STENT PLACEMENT  2005   Taxus stent placed to his RCA   . HERNIA REPAIR    . ILIAC ARTERY STENT Right 12/15/1999   a. history of stent to right common iliac artery 12/15/99 with a peak for balloon-expandable stent. b. s/p intervention on RCIA total occlusion PTA and stent, residual disease on the right for possible staged intervention, notable disease on the left but asymptomatic.  Marland Kitchen ILIAC VEIN ANGIOPLASTY / STENTING Right 03/16/2014   rt common iliac    by dr berry  .  LAPAROSCOPIC CHOLECYSTECTOMY  1990s  . LOWER EXTREMITY ANGIOGRAM N/A 03/16/2014   Procedure: LOWER EXTREMITY ANGIOGRAM;  Surgeon: [name], MD;  Location: [facility] CATH LAB;  Service: Cardiovascular;  Laterality: N/A;  . LOWER EXTREMITY ANGIOGRAPHY N/A 10/30/2016   Procedure: Lower Extremity Angiography;  Surgeon: [name], MD;  Location: [facility] CV LAB;  Service: Cardiovascular;  Laterality: N/A;  . LOWER EXTREMITY ANGIOGRAPHY N/A 11/30/2016   Procedure: Lower Extremity Angiography;  Surgeon: [name], MD;   Location: [facility] CV LAB;  Service: Cardiovascular;  Laterality: N/A;  . PERIPHERAL VASCULAR INTERVENTION Left 10/30/2016   Procedure: Peripheral Vascular Intervention;  Surgeon: [name], MD;  Location: [facility] CV LAB;  Service: Cardiovascular;  Laterality: Left;  Lt Com ILIAC  . TONSILLECTOMY AND ADENOIDECTOMY  1950s  . UMBILICAL HERNIA REPAIR  2012    Current Outpatient Prescriptions on File Prior to Visit  Medication Sig Dispense Refill  . albuterol (PROVENTIL HFA;VENTOLIN HFA) 108 (90 Base) MCG/ACT inhaler Inhale 2 puffs into the lungs every 6 (six) hours as needed for wheezing or shortness of breath. 3 Inhaler 3  . buPROPion (WELLBUTRIN SR) 150 MG 12 hr tablet Take 1 tablet (150 mg total) by mouth 2 (two) times daily. 60 tablet 3  . clopidogrel (PLAVIX) 75 MG tablet Take 1 tablet (75 mg total) by mouth daily. 90 tablet 3  . Coenzyme Q10 (COQ10) 100 MG CAPS Take 1 capsule by mouth daily.    Marland Kitchen ezetimibe (ZETIA) 10 MG tablet Take 1 tablet (10 mg total) by mouth daily. 30 tablet 11  . fexofenadine (ALLEGRA) 180 MG tablet Take 180 mg by mouth daily.     Marland Kitchen losartan (COZAAR) 25 MG tablet Take 1 tablet (25 mg total) by mouth daily. 30 tablet 3  . simvastatin (ZOCOR) 40 MG tablet Take 1 tablet (40 mg total) by mouth daily. 30 tablet 11  . triamcinolone (NASACORT ALLERGY 24HR) 55 MCG/ACT AERO nasal inhaler Place 2 sprays into the nose daily as needed (allergies).    . triamcinolone ointment (KENALOG) 0.1 % Apply 1 application topically 2 (two) times daily as needed (rash).     No current facility-administered medications on file prior to visit.     Allergies  Allergen Reactions  . Aspirin Other (See Comments)    Sneezes, watery nose, wheeze (no polyps)   . Contrast Media [Iodinated Diagnostic Agents]     Need to be pre-medicated, sneezing, watery eyes  . Erythromycin Hives    Childhood allergy All mycin drugs   . Penicillins     Childhood allergy Has patient had a PCN  reaction causing immediate rash, facial/tongue/throat swelling, SOB or lightheadedness with hypotension: Unknown Has patient had a PCN reaction causing severe rash involving mucus membranes or skin necrosis: Unknown Has patient had a PCN reaction that required hospitalization: No Has patient had a PCN reaction occurring within the last 10 years: No States he has had benadryl when taking this and had no problems recently    . Sulfa Antibiotics     unknown    Family History  Problem Relation Age of Onset  . Heart disease Father   . Asthma Son   . Lung cancer Paternal Grandfather   . CAD Brother   . CAD Brother     Social History   Social History  . Marital status: Married    Spouse name: Olin Hauser  . Number of children: 2  . Years of education: N/A   Occupational  History  . textile manf    Social History Main Topics  . Smoking status: Current Every Day Smoker    Packs/day: 1.00    Years: 50.00    Types: Cigarettes  . Smokeless tobacco: Never Used  . Alcohol use Yes     Comment: 10/30/2016 "might have a few drinks 4-5 times/year"  . Drug use: No  . Sexual activity: Not Currently   Other Topics Concern  . Not on file   Social History Narrative  . No narrative on file    ROS  General: Denies fever, chills, night sweats, changes in weight, changes in appetite HEENT: Denies headaches, ear pain, changes in vision, rhinorrhea, sore throat CV: Denies CP, palpitations, SOB, orthopnea Pulm: Denies SOB, cough, wheezing GI: Denies abdominal pain, nausea, vomiting, diarrhea, constipation  +bloating GU: Denies dysuria, hematuria, frequency, vaginal discharge Msk: Denies muscle cramps, joint pains Neuro: Denies weakness, numbness, tingling Skin: Denies rashes, bruising  +concerning moles Psych: Denies depression, anxiety, hallucinations   BP 130/76 (BP Location: Right Arm, Cuff Size: Normal)   Pulse 93   Temp 98.4 F (36.9 C) (Oral)   Ht 5' 10"  (1.778 m)   Wt 208 lb 12.8  oz (94.7 kg)   SpO2 99%   BMI 29.96 kg/m   Physical Exam  Gen. Pleasant, well developed, well-nourished, in NAD HEENT - [place]/AT, PERRL, EOMI, conjunctive clear, no scleral icterus, no nasal drainage, pharynx without erythema or exudate. Neck: No JVD, no thyromegaly, no carotid bruits Lungs: no accessory muscle use, CTAB, no wheezes, rales or rhonchi Cardiovascular: RRR, No r/g/m, no peripheral edema Abdomen: well healed surgical scar, BS present, soft, nontender,nondistended, no hepatosplenomegaly Musculoskeletal: No deformities, moves all four extremities, no cyanosis or clubbing, normal tone Neuro:  A&Ox3, CN II-XII intact, normal gait Skin:  Warm, dry, intact,  3x2 cm raised hyperpigmented mole regular boarders on midline mid back, L calf <1 cm dry area likely actinic keratosis, L areola with seborrheic keratosis Psych: normal affect, mood appropriate  Recent Results (from the past 2160 hour(s))  CBC with Differential/Platelet     Status: Abnormal   Collection Time: 10/24/16  8:07 AM  Result Value Ref Range   WBC 10.7 3.4 - 10.8 x10E3/uL   RBC 4.82 4.14 - 5.80 x10E6/uL   Hemoglobin 16.6 13.0 - 17.7 g/dL   Hematocrit 47.6 37.5 - 51.0 %   MCV 99 (H) 79 - 97 fL   MCH 34.4 (H) 26.6 - 33.0 pg   MCHC 34.9 31.5 - 35.7 g/dL   RDW 13.2 12.3 - 15.4 %   Platelets 224 150 - 379 x10E3/uL   Neutrophils 70 Not Estab. %   Lymphs 15 Not Estab. %   Monocytes 12 Not Estab. %   Eos 2 Not Estab. %   Basos 0 Not Estab. %   Neutrophils Absolute 7.5 (H) 1.4 - 7.0 x10E3/uL   Lymphocytes Absolute 1.6 0.7 - 3.1 x10E3/uL   Monocytes Absolute 1.2 (H) 0.1 - 0.9 x10E3/uL   EOS (ABSOLUTE) 0.2 0.0 - 0.4 x10E3/uL   Basophils Absolute 0.0 0.0 - 0.2 x10E3/uL   Immature Granulocytes 1 Not Estab. %   Immature Grans (Abs) 0.1 0.0 - 0.1 x10E3/uL  Protime-INR     Status: None   Collection Time: 10/24/16  8:07 AM  Result Value Ref Range   INR 1.0 0.8 - 1.2    Comment: Reference interval is for  non-anticoagulated patients. Suggested INR therapeutic range for Vitamin K antagonist therapy:    Standard  Dose (moderate intensity                   therapeutic range):       2.0 - 3.0    Higher intensity therapeutic range       2.5 - 3.5    Prothrombin Time 11.0 9.1 - 12.0 sec  APTT     Status: None   Collection Time: 10/24/16  8:07 AM  Result Value Ref Range   aPTT 29 24 - 33 sec    Comment: This test has not been validated for monitoring unfractionated heparin therapy. aPTT-based therapeutic ranges for unfractionated heparin therapy have not been established. For general guidelines on Heparin monitoring, refer to the [employer].   Basic Metabolic Panel (BMET)     Status: Abnormal   Collection Time: 10/24/16  8:07 AM  Result Value Ref Range   Glucose 123 (H) 65 - 99 mg/dL   BUN 11 8 - 27 mg/dL   Creatinine, Ser 0.77 0.76 - 1.27 mg/dL   GFR calc non Af Amer 95 >59 mL/min/1.73   GFR calc Af Amer 110 >59 mL/min/1.73   BUN/Creatinine Ratio 14 10 - 24   Sodium 139 134 - 144 mmol/L   Potassium 4.6 3.5 - 5.2 mmol/L   Chloride 103 96 - 106 mmol/L   CO2 20 20 - 29 mmol/L    Comment:               **Please note reference interval change**   Calcium 8.7 8.6 - 10.2 mg/dL  POCT Activated clotting time     Status: None   Collection Time: 10/30/16  8:25 AM  Result Value Ref Range   Activated Clotting Time 263 seconds  POCT Activated clotting time     Status: None   Collection Time: 10/30/16  8:43 AM  Result Value Ref Range   Activated Clotting Time 230 seconds  POCT Activated clotting time     Status: None   Collection Time: 10/30/16  9:43 AM  Result Value Ref Range   Activated Clotting Time 197 seconds  POCT Activated clotting time     Status: None   Collection Time: 10/30/16 10:26 AM  Result Value Ref Range   Activated Clotting Time 164 seconds  Basic metabolic panel      Status: Abnormal   Collection Time: 10/31/16  2:44 AM  Result Value Ref Range   Sodium  139 135 - 145 mmol/L   Potassium 4.2 3.5 - 5.1 mmol/L   Chloride 107 101 - 111 mmol/L   CO2 25 22 - 32 mmol/L   Glucose, Bld 126 (H) 65 - 99 mg/dL   BUN 13 6 - 20 mg/dL   Creatinine, Ser 0.78 0.61 - 1.24 mg/dL   Calcium 8.5 (L) 8.9 - 10.3 mg/dL   GFR calc non Af Amer >60 >60 mL/min   GFR calc Af Amer >60 >60 mL/min    Comment: (NOTE) The eGFR has been calculated using the CKD EPI equation. This calculation has not been validated in all clinical situations. eGFR's persistently <60 mL/min signify possible Chronic Kidney Disease.    Anion gap 7 5 - 15  CBC     Status: Abnormal   Collection Time: 10/31/16  2:44 AM  Result Value Ref Range   WBC 19.3 (H) 4.0 - 10.5 K/uL   RBC 4.40 4.22 - 5.81 MIL/uL   Hemoglobin 14.6 13.0 - 17.0 g/dL   HCT 43.8 39.0 - 52.0 %  MCV 99.5 78.0 - 100.0 fL   MCH 33.2 26.0 - 34.0 pg   MCHC 33.3 30.0 - 36.0 g/dL   RDW 12.9 11.5 - 15.5 %   Platelets 192 150 - 400 K/uL  Lipid panel     Status: None   Collection Time: 11/14/16 12:00 AM  Result Value Ref Range   Cholesterol, Total 143 100 - 199 mg/dL   Triglycerides 108 0 - 149 mg/dL   HDL 58 >39 mg/dL   VLDL Cholesterol Cal 22 5 - 40 mg/dL   LDL Calculated 63 0 - 99 mg/dL   Chol/HDL Ratio 2.5 0.0 - 5.0 ratio    Comment:                                   T. Chol/HDL Ratio                                             Men  Women                               1/2 Avg.Risk  3.4    3.3                                   Avg.Risk  5.0    4.4                                2X Avg.Risk  9.6    7.1                                3X Avg.Risk 23.4   11.0   CBC     Status: Abnormal   Collection Time: 11/21/16 10:55 AM  Result Value Ref Range   WBC 8.5 3.4 - 10.8 x10E3/uL   RBC 4.93 4.14 - 5.80 x10E6/uL   Hemoglobin 17.3 13.0 - 17.7 g/dL   Hematocrit 47.9 37.5 - 51.0 %   MCV 97 79 - 97 fL   MCH 35.1 (H) 26.6 - 33.0 pg   MCHC 36.1 (H) 31.5 - 35.7 g/dL   RDW 13.2 12.3 - 15.4 %   Platelets 238 150 - 379 x10E3/uL   INR/PT     Status: None   Collection Time: 11/21/16 10:55 AM  Result Value Ref Range   INR 1.0 0.8 - 1.2    Comment: Reference interval is for non-anticoagulated patients. Suggested INR therapeutic range for Vitamin K antagonist therapy:    Standard Dose (moderate intensity                   therapeutic range):       2.0 - 3.0    Higher intensity therapeutic range       2.5 - 3.5    Prothrombin Time 10.8 9.1 - 12.0 sec  Basic metabolic panel     Status: Abnormal   Collection Time: 11/21/16 11:04 AM  Result Value Ref Range   Glucose 136 (H) 65 - 99 mg/dL   BUN 13 8 - 27 mg/dL   Creatinine, Ser  0.80 0.76 - 1.27 mg/dL   GFR calc non Af Amer 94 >59 mL/min/1.73   GFR calc Af Amer 108 >59 mL/min/1.73   BUN/Creatinine Ratio 16 10 - 24   Sodium 141 134 - 144 mmol/L   Potassium 4.7 3.5 - 5.2 mmol/L   Chloride 101 96 - 106 mmol/L   CO2 23 20 - 29 mmol/L   Calcium 9.7 8.6 - 10.2 mg/dL  I-STAT, chem 8     Status: Abnormal   Collection Time: 11/30/16  8:28 AM  Result Value Ref Range   Sodium 140 135 - 145 mmol/L   Potassium 4.5 3.5 - 5.1 mmol/L   Chloride 104 101 - 111 mmol/L   BUN 11 6 - 20 mg/dL   Creatinine, Ser 0.70 0.61 - 1.24 mg/dL   Glucose, Bld 138 (H) 65 - 99 mg/dL   Calcium, Ion 1.19 1.15 - 1.40 mmol/L   TCO2 25 0 - 100 mmol/L   Hemoglobin 16.7 13.0 - 17.0 g/dL   HCT 49.0 39.0 - 52.0 %  POCT Activated clotting time     Status: None   Collection Time: 11/30/16 10:24 AM  Result Value Ref Range   Activated Clotting Time 268 seconds  POCT Activated clotting time     Status: None   Collection Time: 11/30/16 10:51 AM  Result Value Ref Range   Activated Clotting Time 213 seconds  POCT Activated clotting time     Status: None   Collection Time: 11/30/16 11:46 AM  Result Value Ref Range   Activated Clotting Time 175 seconds  Basic metabolic panel     Status: Abnormal   Collection Time: 12/01/16  2:53 AM  Result Value Ref Range   Sodium 139 135 - 145 mmol/L   Potassium 4.5  3.5 - 5.1 mmol/L   Chloride 109 101 - 111 mmol/L   CO2 24 22 - 32 mmol/L   Glucose, Bld 137 (H) 65 - 99 mg/dL   BUN 15 6 - 20 mg/dL   Creatinine, Ser 0.89 0.61 - 1.24 mg/dL   Calcium 8.6 (L) 8.9 - 10.3 mg/dL   GFR calc non Af Amer >60 >60 mL/min   GFR calc Af Amer >60 >60 mL/min    Comment: (NOTE) The eGFR has been calculated using the CKD EPI equation. This calculation has not been validated in all clinical situations. eGFR's persistently <60 mL/min signify possible Chronic Kidney Disease.    Anion gap 6 5 - 15  CBC     Status: Abnormal   Collection Time: 12/01/16  2:53 AM  Result Value Ref Range   WBC 18.1 (H) 4.0 - 10.5 K/uL   RBC 4.22 4.22 - 5.81 MIL/uL   Hemoglobin 14.0 13.0 - 17.0 g/dL   HCT 41.7 39.0 - 52.0 %   MCV 98.8 78.0 - 100.0 fL   MCH 33.2 26.0 - 34.0 pg   MCHC 33.6 30.0 - 36.0 g/dL   RDW 13.3 11.5 - 15.5 %   Platelets 205 150 - 400 K/uL    Assessment/Plan: Actinic keratosis: -L calf  - Plan: Ambulatory referral to Dermatology  Seborrheic keratoses: -L areola. Interested in removal - Plan: Ambulatory referral to Dermatology  Enlarged skin mole  -Mid back.  3x2 cm, Doubled in size within yr.  Interested in removal - Plan: Ambulatory referral to Dermatology  Nicotine dependence, cigarettes -smoking cessation counseling <10 min, >37mn -Smoking <1 ppd -Discussed reducing number of cigarettes weekly -given 1-800 quit now info -Continue bupropion -f/u at next  OFV in 3 months   COPD mixed type ([place]) -followed by Pulm, Dr. Annamaria Boots -Continue albuterol -Encouraged to quit smoking  Essential hypertension -stable -Continue losartan 25 mg -Continue checking bp at home. -Discussed lifestyle modifiactions  Need for prophylactic vaccination and inoculation against influenza - Plan: Flu vaccine HIGH DOSE PF (Fluzone High dose)  Establish care with a new provider -records release form to be signed.  Offer Tdap and shingles vac.

## 2017-03-21 ENCOUNTER — Encounter: Payer: Self-pay | Admitting: Cardiovascular Disease

## 2017-03-21 ENCOUNTER — Ambulatory Visit (INDEPENDENT_AMBULATORY_CARE_PROVIDER_SITE_OTHER): Payer: Medicare Other | Admitting: Cardiovascular Disease

## 2017-03-21 DIAGNOSIS — E7800 Pure hypercholesterolemia, unspecified: Secondary | ICD-10-CM | POA: Diagnosis not present

## 2017-03-21 DIAGNOSIS — I10 Essential (primary) hypertension: Secondary | ICD-10-CM | POA: Diagnosis not present

## 2017-03-21 DIAGNOSIS — I2510 Atherosclerotic heart disease of native coronary artery without angina pectoris: Secondary | ICD-10-CM | POA: Diagnosis not present

## 2017-03-21 MED ORDER — ZYBAN 150 MG PO TB12: 150.0000 mg | ORAL_TABLET | Freq: Two times a day (BID) | ORAL | 3 refills | Status: DC

## 2017-03-21 NOTE — Assessment & Plan Note (Signed)
History of CAD status post RCA stenting in 2005 the known occluded circumflex. He denies chest pain or shortness of breath.

## 2017-03-21 NOTE — Patient Instructions (Signed)
Your physician wants you to follow-up in: ONE YEAR WITH DR BERRY You will receive a reminder letter in the mail two months in advance. If you don't receive a letter, please call our office to schedule the follow-up appointment.   If you need a refill on your cardiac medications before your next appointment, please call your pharmacy.  

## 2017-03-21 NOTE — Assessment & Plan Note (Signed)
History of PAD status post bypass covered stenting of the right common iliac artery CTO by myself 03/16/14. His ABI improved 0.31 up to 0.54 and his right hip claudication completely resolved. He did have a moderately long segment occlusion mid right SFA I attempted to recanalize percutaneously 11/30/16 was unable to cross the proximal And abandon the procedure. He still has mild lifestyle limiting claudication.

## 2017-03-21 NOTE — Progress Notes (Signed)
03/21/2017 Brady Crumb Sr.   05-04-1950  [id number]  Primary Physician Glendon Axe, MD Primary Cardiologist: [name] MD Lupe Carney, [place]  HPI:  Brady Crumb Sr. is a 66 y.o.  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 Durwin Nora Little's and now sees Dr. Claudie Leach. I last saw him in the office 08/29/16. He has a history of CAD status post RCA stenting back in 2005 with a known chronically occluded circumflex and normal LV function. His cardiac risk factor profile is notable for tobacco abuse and treated hyperlipidemia. He denies chest pain or shortness of breath. I stented his right common iliac artery 12/15/99 with a balloon-expandable stent (8 mm x 2 cm). He had excellent angiographic and clinical results. Over the last 2-3 years she's had progressive claudication in his right hip buttock and leg. Recent workup performed by Dr. Claudie Leach revealed a right ABI of 0.43 with what appeared to be an occluded right common iliac and SFA. A CT scan confirmed iliac occlusion. Since I saw him in the office 01/22/14 he had arterial Doppler studies performed 01/30/14 revealing a right ABI of 0.31 with an occluded right common iliac and SFA. His left ABI was 25 with a high frequency signal in his distal left SFA. He is symptomatic on the right with Rutherford class IV claudication. He also had a nodule on his preoperative chest x-ray which was confirmed to be a 5 x 6 mm millimeter right upper lobe nodule by CT scanning suspicious for malignancy and patient with a history of tobacco abuse. He has an appointment to see Dr. Keturah Barre next month for further evaluation of this. 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 His right ABI improved from .31-.54 and his right hip claudication has completely resolved. Since I saw him to half years ago he's remained stable. Does continue to smoke a pack a day. He denies chest pain, shortness of breath But does complain of bilateral lower extremity lifestyle including claudication. His Dopplers performed 5/70/18 to decline in his Left ABI From 0.76- 0. 59 with a newly occluded left SFA. I performed left common iliac PTA and stenting on 7/2. He has occluded bilateral SFAs and underwent attempt at right SFA CTO intervention by myself 11/30/16 which was failed and aborted because of inability to cross the proximal cap. His claudication markedly improved with revascularization of his right iliac CTO although he still has mild lifestyle limiting claudication.   Current Meds  Medication Sig  . albuterol (PROVENTIL HFA;VENTOLIN HFA) 108 (90 Base) MCG/ACT inhaler Inhale 2 puffs into the lungs every 6 (six) hours as needed for wheezing or shortness of breath.  . clopidogrel (PLAVIX) 75 MG tablet Take 1 tablet (75 mg total) by mouth daily.  . Coenzyme Q10 (COQ10) 100 MG CAPS Take 1 capsule by mouth daily.  Marland Kitchen ezetimibe (ZETIA) 10 MG tablet Take 10 mg by mouth daily.  . fexofenadine (ALLEGRA) 180 MG tablet Take 180 mg by mouth daily.   Marland Kitchen losartan (COZAAR) 25 MG tablet Take 25 mg by mouth daily.  . simvastatin (ZOCOR) 40 MG tablet Take 1 tablet (40 mg total) by mouth daily.  Marland Kitchen triamcinolone (NASACORT ALLERGY  24HR) 55 MCG/ACT AERO nasal inhaler Place 2 sprays into the nose daily as needed (allergies).  . triamcinolone ointment (KENALOG) 0.1 % Apply 1 application topically 2 (two) times daily as needed (rash).  . [DISCONTINUED] buPROPion (WELLBUTRIN SR) 150 MG 12 hr tablet Take 1 tablet (150 mg total) by mouth 2 (two) times daily.     Allergies  Allergen Reactions  . Aspirin Other (See Comments)    Sneezes, watery nose, wheeze (no polyps)   . Contrast Media [Iodinated  Diagnostic Agents]     Need to be pre-medicated, sneezing, watery eyes  . Erythromycin Hives    Childhood allergy All mycin drugs   . Penicillins     Childhood allergy Has patient had a PCN reaction causing immediate rash, facial/tongue/throat swelling, SOB or lightheadedness with hypotension: Unknown Has patient had a PCN reaction causing severe rash involving mucus membranes or skin necrosis: Unknown Has patient had a PCN reaction that required hospitalization: No Has patient had a PCN reaction occurring within the last 10 years: No States he has had benadryl when taking this and had no problems recently    . Sulfa Antibiotics     unknown    Social History   Socioeconomic History  . Marital status: Married    Spouse name: Olin Hauser  . Number of children: 2  . Years of education: Not on file  . Highest education level: Not on file  Social Needs  . Financial resource strain: Not on file  . Food insecurity - worry: Not on file  . Food insecurity - inability: Not on file  . Transportation needs - medical: Not on file  . Transportation needs - non-medical: Not on file  Occupational History  . Occupation: textile manf  Tobacco Use  . Smoking status: Current Every Day Smoker    Packs/day: 1.00    Years: 50.00    Pack years: 50.00    Types: Cigarettes  . Smokeless tobacco: Never Used  Substance and Sexual Activity  . Alcohol use: Yes    Comment: 10/30/2016 "might have a few drinks 4-5 times/year"  . Drug use: No  . Sexual activity: Not Currently  Other Topics Concern  . Not on file  Social History Narrative  . Not on file     Review of Systems: General: negative for chills, fever, night sweats or weight changes.  Cardiovascular: negative for chest pain, dyspnea on exertion, edema, orthopnea, palpitations, paroxysmal nocturnal dyspnea or shortness of breath Dermatological: negative for rash Respiratory: negative for cough or wheezing Urologic: negative for  hematuria Abdominal: negative for nausea, vomiting, diarrhea, bright red blood per rectum, melena, or hematemesis Neurologic: negative for visual changes, syncope, or dizziness All other systems reviewed and are otherwise negative except as noted above.    Blood pressure (!) 162/81, pulse 96, height 5\' 10"  (1.778 m), weight 211 lb 12.8 oz (96.1 kg), SpO2 97 %.  General appearance: alert and no distress Neck: no adenopathy, no carotid bruit, no JVD, supple, symmetrical, trachea midline and thyroid not enlarged, symmetric, no tenderness/mass/nodules Lungs: clear to auscultation bilaterally Heart: regular rate and rhythm, S1, S2 normal, no murmur, click, rub or gallop Extremities: extremities normal, atraumatic, no cyanosis or edema Pulses: Diminished pedal pulses bilaterally Skin: Skin color, texture, turgor normal. No rashes or lesions Neurologic: Alert and oriented X 3, normal strength and tone. Normal symmetric reflexes. Normal coordination and gait  EKG not performed today  ASSESSMENT AND PLAN:   Peripheral arterial disease ([place]) History of PAD  status post bypass covered stenting of the right common iliac artery CTO by myself 03/16/14. His ABI improved 0.31 up to 0.54 and his right hip claudication completely resolved. He did have a moderately long segment occlusion mid right SFA I attempted to recanalize percutaneously 11/30/16 was unable to cross the proximal And abandon the procedure. He still has mild lifestyle limiting claudication.  Coronary artery disease History of CAD status post RCA stenting in 2005 the known occluded circumflex. He denies chest pain or shortness of breath.  Hyperlipidemia History of hyperlipidemia on statin therapy with recent lipid profile performed 11/14/16 revealing LDL 63 and HDL of 58.  Tobacco abuse History of continued tobacco abuse with the desire to stop. We will write him a prescription for Zyban today.  Essential hypertension History of  essential hypertension with blood pressure measured today 162/81 although at home prior to coming to the office the systolic was in the 062 range. He is on losartan. Continue current meds current dosing.      [name] MD FACP,FACC,FAHA, [facility] 03/21/2017 9:53 AM

## 2017-03-21 NOTE — Assessment & Plan Note (Addendum)
History of essential hypertension with blood pressure measured today 162/81 although at home prior to coming to the office the systolic was in the 688 range. He is on losartan. Continue current meds current dosing.

## 2017-03-21 NOTE — Assessment & Plan Note (Signed)
History of hyperlipidemia on statin therapy with recent lipid profile performed 11/14/16 revealing LDL 63 and HDL of 58.

## 2017-03-21 NOTE — Assessment & Plan Note (Signed)
History of continued tobacco abuse with the desire to stop. We will write him a prescription for Zyban today.

## 2017-03-28 ENCOUNTER — Encounter: Payer: Self-pay | Admitting: Family Medicine

## 2017-03-28 ENCOUNTER — Telehealth: Payer: Self-pay | Admitting: Emergency Medicine

## 2017-03-28 ENCOUNTER — Ambulatory Visit (INDEPENDENT_AMBULATORY_CARE_PROVIDER_SITE_OTHER): Payer: Medicare Other | Admitting: Family Medicine

## 2017-03-28 VITALS — BP 159/86 | HR 105 | Temp 97.9°F | Wt 212.2 lb

## 2017-03-28 DIAGNOSIS — I10 Essential (primary) hypertension: Secondary | ICD-10-CM

## 2017-03-28 DIAGNOSIS — I2510 Atherosclerotic heart disease of native coronary artery without angina pectoris: Secondary | ICD-10-CM | POA: Diagnosis not present

## 2017-03-28 DIAGNOSIS — Z720 Tobacco use: Secondary | ICD-10-CM | POA: Diagnosis not present

## 2017-03-28 DIAGNOSIS — J302 Other seasonal allergic rhinitis: Secondary | ICD-10-CM | POA: Diagnosis not present

## 2017-03-28 NOTE — Progress Notes (Signed)
Subjective:    Patient ID: Brady Crumb Sr., male    DOB: May 18, 1950, 66 y.o.   MRN: [id number]  Chief Complaint  Patient presents with  . Follow-up    HPI Patient was seen today for follow-up.  HTN: -Pt endorses compliance with BP meds.  Losartan 25 mg daily -BP at home 135-138/76-80 -Pt denies headache, chest pain, changes in vision  Tobacco use: -Pt tried bupropion, but DC'd use secondary to feeling jittery, having bad dreams. -In the past patient was on Zyban.  This was restarted by cardiology -Pt has not picked up Zyban from the pharmacy -During the weekday patient smoking less than 1 pack/day.  During the weekend typically smokes about a pack a day  Seasonal allergies: -Pt endorses the increased leaves giving him problems. -Taking Allegra daily  Pt inquires about referral to dermatology as he is not yet heard anything about an appointment.  Past Medical History:  Diagnosis Date  . COPD (chronic obstructive pulmonary disease) ([place])   . Coronary artery disease    a. s/p RCA stenting back in 2005 with a known chronically occluded circumflex and normal LV function.  . Emphysema lung ([place])   . GERD (gastroesophageal reflux disease)   . Heart attack ([place]) <2005  . History of tobacco abuse   . Hyperlipidemia   . Hypertension   . Lung nodule    a. Suspicious for malignancy, undergoing workup.  . Obesity   . Peripheral arterial disease ([place])    a. history of stent to right common iliac artery 12/15/99 with a peak for balloon-expandable stent. b. s/p intervention on RCIA total occlusion PTA and stent, residual disease on the right for possible staged intervention, notable disease on the left but asymptomatic in 2015. c. 10/2016: s/p PTA and stenting of left common iliac    Allergies  Allergen Reactions  . Aspirin Other (See Comments)    Sneezes, watery nose, wheeze (no polyps)   . Contrast Media [Iodinated Diagnostic Agents]     Need to be pre-medicated, sneezing,  watery eyes  . Erythromycin Hives    Childhood allergy All mycin drugs   . Penicillins     Childhood allergy Has patient had a PCN reaction causing immediate rash, facial/tongue/throat swelling, SOB or lightheadedness with hypotension: Unknown Has patient had a PCN reaction causing severe rash involving mucus membranes or skin necrosis: Unknown Has patient had a PCN reaction that required hospitalization: No Has patient had a PCN reaction occurring within the last 10 years: No States he has had benadryl when taking this and had no problems recently    . Sulfa Antibiotics     unknown    ROS  General: Denies fever, chills, night sweats, changes in weight, changes in appetite HEENT: Denies headaches, ear pain, changes in vision, rhinorrhea, sore throat CV: Denies CP, palpitations, SOB, orthopnea Pulm: Denies SOB, cough, wheezing GI: Denies abdominal pain, nausea, vomiting, diarrhea, constipation GU: Denies dysuria, hematuria, frequency, vaginal discharge Msk: Denies muscle cramps, joint pains Neuro: Denies weakness, numbness, tingling Skin: Denies rashes, bruising Psych: Denies depression, anxiety, hallucinations     Objective:    Blood pressure (!) 159/86, pulse (!) 105, temperature 97.9 F (36.6 C), temperature source Oral, weight 212 lb 3.2 oz (96.3 kg).   Gen. Pleasant, well-nourished, in no distress, normal affect   HEENT: [place]/AT, face symmetric, no scleral icterus, PERRLA, nares patent without drainage, pharynx without erythema or exudate. Lungs: no accessory muscle use, CTAB, no wheezes or rales Cardiovascular: initially  tachycardic, no m/r/g, no peripheral edema Abdomen: BS present, soft, NT/ND. Musculoskeletal: No deformities, no cyanosis or clubbing, normal tone Neuro:  A&Ox3, CN II-XII intact, normal gait Skin:  Warm, no lesions/ rash   Wt Readings from Last 3 Encounters:  03/28/17 212 lb 3.2 oz (96.3 kg)  03/21/17 211 lb 12.8 oz (96.1 kg)  12/26/16 208 lb 12.8  oz (94.7 kg)    Lab Results  Component Value Date   WBC 18.1 (H) 12/01/2016   HGB 14.0 12/01/2016   HCT 41.7 12/01/2016   PLT 205 12/01/2016   GLUCOSE 137 (H) 12/01/2016   CHOL 143 11/14/2016   TRIG 108 11/14/2016   HDL 58 11/14/2016   LDLCALC 63 11/14/2016   ALT 20 09/12/2016   AST 15 09/12/2016   NA 139 12/01/2016   K 4.5 12/01/2016   CL 109 12/01/2016   CREATININE 0.89 12/01/2016   BUN 15 12/01/2016   CO2 24 12/01/2016   TSH 0.87 09/12/2016   PSA 2.2 09/12/2016   INR 1.0 11/21/2016    Assessment/Plan:  Essential hypertension -bp elevated in office. Repeated -stable at home sys 130s/70s-80s. -Given stable bp at home, will continue losartan 25 mg -continue home bp checks -Discussed increasing physical activity, increasing water intake, and decreasing sodium intake -continue f/u with Cards  Seasonal allergies -continue allegra  Tobacco use -currently smoking ~1ppd -Pt encouraged to pick up Zyban from pharmacy.  Worked in the past. -Smoking cessation counseling >37min, <10 min -will evaluate at each OFV.   F/u in 3 mo for CPE, labs (lipid panel, CBC, CMP) Referral for Derm was placed in August.  Will re-contact Derm office for further details on pt's appt.

## 2017-03-28 NOTE — Telephone Encounter (Signed)
Spoke with patient regarding Dermatology referral. Patient was contacted by the [facility] and appointment was scheduled for 08/28/2016 at 9:30am. Patient was given office address and had no further questions.

## 2017-03-28 NOTE — Patient Instructions (Addendum)
Coping with Quitting Smoking Quitting smoking is a physical and mental challenge. You will face cravings, withdrawal symptoms, and temptation. Before quitting, work with your health care provider to make a plan that can help you cope. Preparation can help you quit and keep you from giving in. How can I cope with cravings? Cravings usually last for 5-10 minutes. If you get through it, the craving will pass. Consider taking the following actions to help you cope with cravings:  Keep your mouth busy: ? Chew sugar-free gum. ? Suck on hard candies or a straw. ? Brush your teeth.  Keep your hands and body busy: ? Immediately change to a different activity when you feel a craving. ? Squeeze or play with a ball. ? Do an activity or a hobby, like making bead jewelry, practicing needlepoint, or working with wood. ? Mix up your normal routine. ? Take a short exercise break. Go for a quick walk or run up and down stairs. ? Spend time in public places where smoking is not allowed.  Focus on doing something kind or helpful for someone else.  Call a friend or family member to talk during a craving.  Join a support group.  Call a quit line, such as 1-800-QUIT-NOW.  Talk with your health care provider about medicines that might help you cope with cravings and make quitting easier for you.  How can I deal with withdrawal symptoms? Your body may experience negative effects as it tries to get used to not having nicotine in the system. These effects are called withdrawal symptoms. They may include:  Feeling hungrier than normal.  Trouble concentrating.  Irritability.  Trouble sleeping.  Feeling depressed.  Restlessness and agitation.  Craving a cigarette.  To manage withdrawal symptoms:  Avoid places, people, and activities that trigger your cravings.  Remember why you want to quit.  Get plenty of sleep.  Avoid coffee and other caffeinated drinks. These may worsen some of your  symptoms.  How can I handle social situations? Social situations can be difficult when you are quitting smoking, especially in the first few weeks. To manage this, you can:  Avoid parties, bars, and other social situations where people might be smoking.  Avoid alcohol.  Leave right away if you have the urge to smoke.  Explain to your family and friends that you are quitting smoking. Ask for understanding and support.  Plan activities with friends or family where smoking is not an option.  What are some ways I can cope with stress? Wanting to smoke may cause stress, and stress can make you want to smoke. Find ways to manage your stress. Relaxation techniques can help. For example:  Breathe slowly and deeply, in through your nose and out through your mouth.  Listen to soothing, relaxing music.  Talk with a family member or friend about your stress.  Light a candle.  Soak in a bath or take a shower.  Think about a peaceful place.  What are some ways I can prevent weight gain? Be aware that many people gain weight after they quit smoking. However, not everyone does. To keep from gaining weight, have a plan in place before you quit and stick to the plan after you quit. Your plan should include:  Having healthy snacks. When you have a craving, it may help to: ? Eat plain popcorn, crunchy carrots, celery, or other cut vegetables. ? Chew sugar-free gum.  Changing how you eat: ? Eat small portion sizes at meals. ?   Eat 4-6 small meals throughout the day instead of 1-2 large meals a day. ? Be mindful when you eat. Do not watch television or do other things that might distract you as you eat.  Exercising regularly: ? Make time to exercise each day. If you do not have time for a long workout, do short bouts of exercise for 5-10 minutes several times a day. ? Do some form of strengthening exercise, like weight lifting, and some form of aerobic exercise, like running or  swimming.  Drinking plenty of water or other low-calorie or no-calorie drinks. Drink 6-8 glasses of water daily, or as much as instructed by your health care provider.  Summary  Quitting smoking is a physical and mental challenge. You will face cravings, withdrawal symptoms, and temptation to smoke again. Preparation can help you as you go through these challenges.  You can cope with cravings by keeping your mouth busy (such as by chewing gum), keeping your body and hands busy, and making calls to family, friends, or a helpline for people who want to quit smoking.  You can cope with withdrawal symptoms by avoiding places where people smoke, avoiding drinks with caffeine, and getting plenty of rest.  Ask your health care provider about the different ways to prevent weight gain, avoid stress, and handle social situations. This information is not intended to replace advice given to you by your health care provider. Make sure you discuss any questions you have with your health care provider. Document Released: 04/14/2016 Document Revised: 04/14/2016 Document Reviewed: 04/14/2016 Elsevier Interactive Patient Education  2018 [place] Eating Plan DASH stands for "Dietary Approaches to Stop Hypertension." The DASH eating plan is a healthy eating plan that has been shown to reduce high blood pressure (hypertension). It may also reduce your risk for type 2 diabetes, heart disease, and stroke. The DASH eating plan may also help with weight loss. What are tips for following this plan? General guidelines  Avoid eating more than 2,300 mg (milligrams) of salt (sodium) a day. If you have hypertension, you may need to reduce your sodium intake to 1,500 mg a day.  Limit alcohol intake to no more than 1 drink a day for nonpregnant women and 2 drinks a day for men. One drink equals 12 oz of beer, 5 oz of wine, or 1 oz of hard liquor.  Work with your health care provider to maintain a healthy body  weight or to lose weight. Ask what an ideal weight is for you.  Get at least 30 minutes of exercise that causes your heart to beat faster (aerobic exercise) most days of the week. Activities may include walking, swimming, or biking.  Work with your health care provider or diet and nutrition specialist (dietitian) to adjust your eating plan to your individual calorie needs. Reading food labels  Check food labels for the amount of sodium per serving. Choose foods with less than 5 percent of the Daily Value of sodium. Generally, foods with less than 300 mg of sodium per serving fit into this eating plan.  To find whole grains, look for the word "whole" as the first word in the ingredient list. Shopping  Buy products labeled as "low-sodium" or "no salt added."  Buy fresh foods. Avoid canned foods and premade or frozen meals. Cooking  Avoid adding salt when cooking. Use salt-free seasonings or herbs instead of table salt or sea salt. Check with your health care provider or pharmacist before using salt substitutes.  Do not fry foods. Cook foods using healthy methods such as baking, boiling, grilling, and broiling instead.  Cook with heart-healthy oils, such as olive, canola, soybean, or sunflower oil. Meal planning   Eat a balanced diet that includes: ? 5 or more servings of fruits and vegetables each day. At each meal, try to fill half of your plate with fruits and vegetables. ? Up to 6-8 servings of whole grains each day. ? Less than 6 oz of lean meat, poultry, or fish each day. A 3-oz serving of meat is about the same size as a deck of cards. One egg equals 1 oz. ? 2 servings of low-fat dairy each day. ? A serving of nuts, seeds, or beans 5 times each week. ? Heart-healthy fats. Healthy fats called Omega-3 fatty acids are found in foods such as flaxseeds and coldwater fish, like sardines, salmon, and mackerel.  Limit how much you eat of the following: ? Canned or prepackaged  foods. ? Food that is high in trans fat, such as fried foods. ? Food that is high in saturated fat, such as fatty meat. ? Sweets, desserts, sugary drinks, and other foods with added sugar. ? Full-fat dairy products.  Do not salt foods before eating.  Try to eat at least 2 vegetarian meals each week.  Eat more home-cooked food and less restaurant, buffet, and fast food.  When eating at a restaurant, ask that your food be prepared with less salt or no salt, if possible. What foods are recommended? The items listed may not be a complete list. Talk with your dietitian about what dietary choices are best for you. Grains Whole-grain or whole-wheat bread. Whole-grain or whole-wheat pasta. Brown rice. [place]. Bulgur. Whole-grain and low-sodium cereals. Pita bread. Low-fat, low-sodium crackers. Whole-wheat flour tortillas. Vegetables Fresh or frozen vegetables (raw, steamed, roasted, or grilled). Low-sodium or reduced-sodium tomato and vegetable juice. Low-sodium or reduced-sodium tomato sauce and tomato paste. Low-sodium or reduced-sodium canned vegetables. Fruits All fresh, dried, or frozen fruit. Canned fruit in natural juice (without added sugar). Meat and other protein foods Skinless chicken or [place]. Ground chicken or [place]. Pork with fat trimmed off. Fish and seafood. Egg whites. Dried beans, peas, or lentils. Unsalted nuts, nut butters, and seeds. Unsalted canned beans. Lean cuts of beef with fat trimmed off. Low-sodium, lean deli meat. Dairy Low-fat (1%) or fat-free (skim) milk. Fat-free, low-fat, or reduced-fat cheeses. Nonfat, low-sodium ricotta or cottage cheese. Low-fat or nonfat yogurt. Low-fat, low-sodium cheese. Fats and oils Soft margarine without trans fats. Vegetable oil. Low-fat, reduced-fat, or light mayonnaise and salad dressings (reduced-sodium). Canola, safflower, olive, soybean, and sunflower oils. Avocado. Seasoning and other foods Herbs. Spices. Seasoning  mixes without salt. Unsalted popcorn and pretzels. Fat-free sweets. What foods are not recommended? The items listed may not be a complete list. Talk with your dietitian about what dietary choices are best for you. Grains Baked goods made with fat, such as croissants, muffins, or some breads. Dry pasta or rice meal packs. Vegetables Creamed or fried vegetables. Vegetables in a cheese sauce. Regular canned vegetables (not low-sodium or reduced-sodium). Regular canned tomato sauce and paste (not low-sodium or reduced-sodium). Regular tomato and vegetable juice (not low-sodium or reduced-sodium). Angie Fava. Olives. Fruits Canned fruit in a light or heavy syrup. Fried fruit. Fruit in cream or butter sauce. Meat and other protein foods Fatty cuts of meat. Ribs. Fried meat. Berniece Salines. Sausage. Bologna and other processed lunch meats. Salami. Fatback. Hotdogs. Bratwurst. Salted nuts and seeds. Canned beans  with added salt. Canned or smoked fish. Whole eggs or egg yolks. Chicken or [place] with skin. Dairy Whole or 2% milk, cream, and half-and-half. Whole or full-fat cream cheese. Whole-fat or sweetened yogurt. Full-fat cheese. Nondairy creamers. Whipped toppings. Processed cheese and cheese spreads. Fats and oils Butter. Stick margarine. Lard. Shortening. Ghee. Bacon fat. Tropical oils, such as coconut, palm kernel, or palm oil. Seasoning and other foods Salted popcorn and pretzels. Onion salt, garlic salt, seasoned salt, table salt, and sea salt. Worcestershire sauce. Tartar sauce. Barbecue sauce. Teriyaki sauce. Soy sauce, including reduced-sodium. Steak sauce. Canned and packaged gravies. Fish sauce. Oyster sauce. Cocktail sauce. Horseradish that you find on the shelf. Ketchup. Mustard. Meat flavorings and tenderizers. Bouillon cubes. Hot sauce and Tabasco sauce. Premade or packaged marinades. Premade or packaged taco seasonings. Relishes. Regular salad dressings. Where to find more information:  National  Heart, Lung, and [place]: https://wilson-eaton.com/  American Heart Association: www.heart.org Summary  The DASH eating plan is a healthy eating plan that has been shown to reduce high blood pressure (hypertension). It may also reduce your risk for type 2 diabetes, heart disease, and stroke.  With the DASH eating plan, you should limit salt (sodium) intake to 2,300 mg a day. If you have hypertension, you may need to reduce your sodium intake to 1,500 mg a day.  When on the DASH eating plan, aim to eat more fresh fruits and vegetables, whole grains, lean proteins, low-fat dairy, and heart-healthy fats.  Work with your health care provider or diet and nutrition specialist (dietitian) to adjust your eating plan to your individual calorie needs. This information is not intended to replace advice given to you by your health care provider. Make sure you discuss any questions you have with your health care provider. Document Released: 04/06/2011 Document Revised: 04/10/2016 Document Reviewed: 04/10/2016 Elsevier Interactive Patient Education  2017 Elsevier Inc.  Preventing High Cholesterol Cholesterol is a waxy, fat-like substance that your body needs in small amounts. Your liver makes all the cholesterol that your body needs. Having high cholesterol (hypercholesterolemia) increases your risk for heart disease and stroke. Extra (excess) cholesterol comes from the food you eat, such as animal-based fat (saturated fat) from meat and some dairy products. High cholesterol can often be prevented with diet and lifestyle changes. If you already have high cholesterol, you can control it with diet and lifestyle changes, as well as medicine. What nutrition changes can be made?  Eat less saturated fat. Foods that contain saturated fat include red meat and some dairy products.  Avoid processed meats, like bacon and lunch meats.  Avoid trans fats, which are found in margarine and some baked goods.  Avoid  foods and beverages that have added sugars.  Eat more fruits, vegetables, and whole grains.  Choose healthy sources of protein, such as fish, poultry, and nuts.  Choose healthy sources of fat, such as: ? Nuts. ? Vegetable oils, especially olive oil. ? Fish that have healthy fats (omega-3 fatty acids), such as mackerel or salmon. What lifestyle changes can be made?  Lose weight if you are overweight. Losing 5-10 lb (2.3-4.5 kg) can help prevent or control high cholesterol and reduce your risk for diabetes and high blood pressure. Ask your health care provider to help you with a diet and exercise plan to safely lose weight.  Get enough exercise. Do at least 150 minutes of moderate-intensity exercise each week. ? You could do this in short exercise sessions several times a day, or you  could do longer exercise sessions a few times a week. For example, you could take a brisk 10-minute walk or bike ride, 3 times a day, for 5 days a week.  Do not smoke. If you need help quitting, ask your health care provider.  Limit your alcohol intake. If you drink alcohol, limit alcohol intake to no more than 1 drink a day for nonpregnant women and 2 drinks a day for men. One drink equals 12 oz of beer, 5 oz of wine, or 1 oz of hard liquor. Why are these changes important? If you have high cholesterol, deposits (plaques) may build up on the walls of your blood vessels. Plaques make the arteries narrower and stiffer, which can restrict or block blood flow and cause blood clots to form. This greatly increases your risk for heart attack and stroke. Making diet and lifestyle changes can reduce your risk for these life-threatening conditions. What can I do to lower my risk?  Manage your risk factors for high cholesterol. Talk with your health care provider about all of your risk factors and how to lower your risk.  Manage other conditions that you have, such as diabetes or high blood pressure  (hypertension).  Have your cholesterol checked at regular intervals.  Keep all follow-up visits as told by your health care provider. This is important. How is this treated? In addition to diet and lifestyle changes, your health care provider may recommend medicines to help lower cholesterol, such as a medicine to reduce the amount of cholesterol made in your liver. You may need medicine if:  Diet and lifestyle changes do not lower your cholesterol enough.  You have high cholesterol and other risk factors for heart disease or stroke.  Take over-the-counter and prescription medicines only as told by your health care provider. Where to find more information:  American Heart Association: ThisTune.com.pt.jsp  National Heart, Lung, and Blood Institute: FrenchToiletries.com.cy Summary  High cholesterol increases your risk for heart disease and stroke. By keeping your cholesterol level low, you can reduce your risk for these conditions.  Diet and lifestyle changes are the most important steps in preventing high cholesterol.  Work with your health care provider to manage your risk factors, and have your blood tested regularly. This information is not intended to replace advice given to you by your health care provider. Make sure you discuss any questions you have with your health care provider. Document Released: 05/02/2015 Document Revised: 12/25/2015 Document Reviewed: 12/25/2015 Elsevier Interactive Patient Education  [employer].

## 2017-05-05 ENCOUNTER — Encounter: Payer: Self-pay | Admitting: Family Medicine

## 2017-05-07 NOTE — Telephone Encounter (Signed)
Would you like for me to send in Flonase? Please advise. Thank you.

## 2017-05-10 ENCOUNTER — Other Ambulatory Visit: Payer: Self-pay | Admitting: Family Medicine

## 2017-05-10 MED ORDER — FLUTICASONE PROPIONATE 50 MCG/ACT NA SUSP: 1.0000 | Freq: Every day | NASAL | 6 refills | Status: DC

## 2017-05-30 ENCOUNTER — Other Ambulatory Visit: Payer: Self-pay | Admitting: Student

## 2017-06-14 ENCOUNTER — Other Ambulatory Visit: Payer: Self-pay | Admitting: *Deleted

## 2017-06-15 ENCOUNTER — Other Ambulatory Visit: Payer: Self-pay | Admitting: *Deleted

## 2017-06-15 MED ORDER — LOSARTAN POTASSIUM 25 MG PO TABS: 25.0000 mg | ORAL_TABLET | Freq: Every day | ORAL | 1 refills | Status: DC

## 2017-08-28 DIAGNOSIS — D225 Melanocytic nevi of trunk: Secondary | ICD-10-CM | POA: Diagnosis not present

## 2017-08-28 DIAGNOSIS — L814 Other melanin hyperpigmentation: Secondary | ICD-10-CM | POA: Diagnosis not present

## 2017-08-28 DIAGNOSIS — L820 Inflamed seborrheic keratosis: Secondary | ICD-10-CM | POA: Diagnosis not present

## 2017-08-28 DIAGNOSIS — L218 Other seborrheic dermatitis: Secondary | ICD-10-CM | POA: Diagnosis not present

## 2017-08-28 DIAGNOSIS — L821 Other seborrheic keratosis: Secondary | ICD-10-CM | POA: Diagnosis not present

## 2017-08-28 DIAGNOSIS — L570 Actinic keratosis: Secondary | ICD-10-CM | POA: Diagnosis not present

## 2017-12-27 ENCOUNTER — Other Ambulatory Visit: Payer: Self-pay | Admitting: Cardiovascular Disease

## 2017-12-28 NOTE — Telephone Encounter (Signed)
Rx sent to pharmacy   

## 2018-01-10 ENCOUNTER — Other Ambulatory Visit: Payer: Self-pay | Admitting: Internal Medicine

## 2018-01-17 ENCOUNTER — Other Ambulatory Visit: Payer: Self-pay | Admitting: Internal Medicine

## 2018-02-14 ENCOUNTER — Other Ambulatory Visit: Payer: Self-pay | Admitting: Internal Medicine

## 2018-04-10 DIAGNOSIS — Z23 Encounter for immunization: Secondary | ICD-10-CM | POA: Diagnosis not present

## 2018-05-07 ENCOUNTER — Telehealth: Payer: Self-pay | Admitting: Internal Medicine

## 2018-05-07 MED ORDER — BUPROPION HCL ER (SR) 150 MG PO TB12: ORAL_TABLET | ORAL | 4 refills | Status: DC

## 2018-05-07 NOTE — Telephone Encounter (Signed)
Per Dr. Annamaria Boots, ok to refill Bupropion 150mg , take 1 tab, twice per day, 90 day supply (#180), with 4 refills.  New prescription sent to [facility].  Nothing further at this time.

## 2018-05-07 NOTE — Telephone Encounter (Signed)
Called and spoke with [place].  Patient is wanting to use mail order pharmacy.  [facility] stated that they needed a new prescription for Bupropion.  Patient's last OV was 08/21/16, with [facility]. Attempted to call Patient.  Unable to get answer and was able to leave a voice mail.   Will route to [place], as [employer]

## 2018-05-07 NOTE — Telephone Encounter (Signed)
CY Please advise on refill request. Thanks.  

## 2018-05-07 NOTE — Telephone Encounter (Signed)
This was prescribed for smoking cessation originally. Ok to refill as 90 day script, ref x 4

## 2018-12-02 ENCOUNTER — Other Ambulatory Visit: Payer: Self-pay

## 2019-02-06 DIAGNOSIS — Z23 Encounter for immunization: Secondary | ICD-10-CM | POA: Diagnosis not present

## 2019-02-10 ENCOUNTER — Other Ambulatory Visit: Payer: Self-pay

## 2019-02-10 MED ORDER — CLOPIDOGREL BISULFATE 75 MG PO TABS: 75.0000 mg | ORAL_TABLET | Freq: Every day | ORAL | 0 refills | Status: DC

## 2019-02-10 NOTE — Telephone Encounter (Signed)
Rx(s) sent to pharmacy electronically.  

## 2019-04-22 ENCOUNTER — Other Ambulatory Visit: Payer: Self-pay | Admitting: Cardiovascular Disease

## 2019-05-07 ENCOUNTER — Other Ambulatory Visit: Payer: Self-pay

## 2019-05-07 NOTE — Telephone Encounter (Signed)
Lm to call back pt needs an appt for further refills of Plavix ./cy

## 2019-05-09 MED ORDER — CLOPIDOGREL BISULFATE 75 MG PO TABS: 75.0000 mg | ORAL_TABLET | Freq: Every day | ORAL | 0 refills | Status: DC

## 2019-05-09 NOTE — Telephone Encounter (Signed)
Rx(s) sent to pharmacy electronically.  

## 2019-05-19 ENCOUNTER — Other Ambulatory Visit: Payer: Self-pay | Admitting: Cardiovascular Disease

## 2019-05-22 ENCOUNTER — Ambulatory Visit: Payer: Medicare Other | Attending: Internal Medicine

## 2019-05-22 DIAGNOSIS — Z23 Encounter for immunization: Secondary | ICD-10-CM | POA: Insufficient documentation

## 2019-05-22 NOTE — Progress Notes (Signed)
   [id number]  Name:  Brady Brooks Sr.    MRN: [id number] DOB: 09-15-1950  05/22/2019  [name] was observed post Covid-19 immunization for 15 minutes without incidence. He was provided with Vaccine Information Sheet and instruction to access the V-Safe system.   [name] was instructed to call 911 with any severe reactions post vaccine: Marland Kitchen Difficulty breathing  . Swelling of your face and throat  . A fast heartbeat  . A bad rash all over your body  . Dizziness and weakness    Immunizations Administered    Name Date Dose VIS Date Route   Pfizer COVID-19 Vaccine 05/22/2019  3:13 PM 0.3 mL 04/11/2019 Intramuscular   Manufacturer: [place]   Lot: [id number]   [place]: [id number]

## 2019-06-12 ENCOUNTER — Ambulatory Visit: Payer: Medicare Other | Attending: Internal Medicine

## 2019-06-12 DIAGNOSIS — Z23 Encounter for immunization: Secondary | ICD-10-CM

## 2019-06-12 NOTE — Progress Notes (Signed)
   [id number]  Name:  Brady PAGE Sr.    MRN: [id number] DOB: [birth year]  06/12/2019  Brady Brooks was observed post Covid-19 immunization for 30 minutes based on pre-vaccination screening without incidence. He was provided with Vaccine Information Sheet and instruction to access the V-Safe system.   Brady Brooks was instructed to call 911 with any severe reactions post vaccine: Marland Kitchen Difficulty breathing  . Swelling of your face and throat  . A fast heartbeat  . A bad rash all over your body  . Dizziness and weakness    Immunizations Administered    Name Date Dose VIS Date Route   Pfizer COVID-19 Vaccine 06/12/2019  4:11 PM 0.3 mL 04/11/2019 Intramuscular   Manufacturer: [place]   Lot: [id number]   [place]: [id number]

## 2019-06-15 ENCOUNTER — Ambulatory Visit: Payer: Medicare Other

## 2019-08-27 ENCOUNTER — Ambulatory Visit (INDEPENDENT_AMBULATORY_CARE_PROVIDER_SITE_OTHER): Payer: Medicare Other | Admitting: Cardiovascular Disease

## 2019-08-27 ENCOUNTER — Encounter: Payer: Self-pay | Admitting: Cardiovascular Disease

## 2019-08-27 ENCOUNTER — Other Ambulatory Visit: Payer: Self-pay

## 2019-08-27 DIAGNOSIS — I739 Peripheral vascular disease, unspecified: Secondary | ICD-10-CM

## 2019-08-27 DIAGNOSIS — I2510 Atherosclerotic heart disease of native coronary artery without angina pectoris: Secondary | ICD-10-CM

## 2019-08-27 DIAGNOSIS — Z720 Tobacco use: Secondary | ICD-10-CM | POA: Diagnosis not present

## 2019-08-27 DIAGNOSIS — E782 Mixed hyperlipidemia: Secondary | ICD-10-CM

## 2019-08-27 DIAGNOSIS — I10 Essential (primary) hypertension: Secondary | ICD-10-CM | POA: Diagnosis not present

## 2019-08-27 LAB — LIPID PANEL
Chol/HDL Ratio: 4.5 ratio (ref 0.0–5.0)
Cholesterol, Total: 227 mg/dL — ABNORMAL HIGH (ref 100–199)
HDL: 51 mg/dL (ref >39)
LDL Chol Calc (NIH): 155 mg/dL — ABNORMAL HIGH (ref 0–99)
Triglycerides: 119 mg/dL (ref 0–149)
VLDL Cholesterol Cal: 21 mg/dL (ref 5–40)

## 2019-08-27 LAB — HEPATIC FUNCTION PANEL
ALT: 26 IU/L (ref 0–44)
AST: 21 IU/L (ref 0–40)
Albumin: 4.8 g/dL (ref 3.8–4.8)
Alkaline Phosphatase: 87 IU/L (ref 39–117)
Bilirubin Total: 0.4 mg/dL (ref 0.0–1.2)
Bilirubin, Direct: 0.15 mg/dL (ref 0.00–0.40)
Total Protein: 7.2 g/dL (ref 6.0–8.5)

## 2019-08-27 MED ORDER — CLOPIDOGREL BISULFATE 75 MG PO TABS: 75.0000 mg | ORAL_TABLET | Freq: Every day | ORAL | 0 refills | Status: DC

## 2019-08-27 MED ORDER — AMLODIPINE BESYLATE 5 MG PO TABS: 5.0000 mg | ORAL_TABLET | Freq: Every day | ORAL | 3 refills | Status: DC

## 2019-08-27 NOTE — Assessment & Plan Note (Signed)
History of CAD status post RCA stenting by Dr. Rex Kras in 2005 with a known chronically occluded circumflex.  He currently denies chest pain.

## 2019-08-27 NOTE — Assessment & Plan Note (Signed)
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  I did try to cross a mid right SFA CTO unsuccessfully 11/30/2016.  He currently denies claudication.  We will recheck lower extremity arterial Doppler studies.

## 2019-08-27 NOTE — Progress Notes (Signed)
08/27/2019 Brady Crumb Sr.   [birth year]  [id number]  Primary Physician Billie Ruddy, MD Primary Cardiologist: [name] MD Lupe Carney, [place]  HPI:  Brady Crumb Sr. is a 69 y.o.  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 He was previously a patient of Dr. Durwin Nora Little's and now sees Dr. Claudie Leach. I last saw him in the office 03/21/2017. He has a history of CAD status post RCA stenting back in 2005 with a known chronically occluded circumflex and normal LV function. His cardiac risk factor profile is notable for tobacco abuse and treated hyperlipidemia. He denies chest pain or shortness of breath. I stented his right common iliac artery 12/15/99 with a balloon-expandable stent (8 mm x 2 cm). He had excellent angiographic and clinical results. Over the last 2-3 years she's had progressive claudication in his right hip buttock and leg. Recent workup performed by Dr. Claudie Leach revealed a right ABI of 0.43 with what appeared to be an occluded right common iliac and SFA. A CT scan confirmed iliac occlusion. Since I saw him in the office 01/22/14 he had arterial Doppler studies performed 01/30/14 revealing a right ABI of 0.31 with an occluded right common iliac and SFA. His left ABI was 25 with a high frequency signal in his distal left SFA. He is symptomatic on the right with Rutherford class IV claudication. He also had a nodule on his preoperative chest x-ray which was confirmed to be a 5 x 6 mm millimeter right upper lobe nodule by CT scanning suspicious for malignancy and patient with a history of tobacco abuse. He has an appointment to see Dr. Keturah Barre next month for further evaluation of this. I performed angiography on him 03/16/14 and was able to percutaneously recanalize his right common iliac artery chronic total occlusion and placed a 7 mm x 30 mm long  ICast covered stent. His right ABI improved from .31-.54 and his right hip claudication has completely resolved. Since I saw him to half years ago he's remained stable. Does continue to smoke a pack a day. He denies chest pain, shortness of breath But does complain of bilateral lower extremity lifestyle including claudication. His Dopplers performed 5/70/18 to decline in his Left ABI From 0.76- 0. 59 with a newly occluded left SFA. I performed left common iliac PTA and stenting on 7/2. He has occluded bilateral SFAs and underwent attempt at right SFA CTO intervention by myself 11/30/16 which was failed and aborted because of inability to cross the proximal cap. His claudication markedly improved with revascularization of his right iliac CTO although he still has mild lifestyle limiting claudication.  Since I saw him in the office 2-1/2 years ago he does continue to smoke a pack a day.  He denies claudication.  He says he does get short of breath and dyspneic probably related to his COPD.  He denies chest pain.  He does complain of a dry hacking cough which he attributes to his losartan.    Current Meds  Medication Sig  . albuterol (PROVENTIL HFA;VENTOLIN HFA) 108 (90 Base) MCG/ACT inhaler Inhale 2 puffs into the lungs every 6 (six) hours as needed for wheezing or shortness of breath.  Marland Kitchen buPROPion (WELLBUTRIN SR) 150 MG 12 hr tablet TAKE 1 TABLET(150 MG) BY MOUTH TWICE DAILY  . clopidogrel (PLAVIX) 75 MG tablet Take 1 tablet (  75 mg total) by mouth daily. <PLEASE MAKE APPOINTMENT FOR REFILLS>  . Coenzyme Q10 (COQ10) 100 MG CAPS Take 1 capsule by mouth daily.  Marland Kitchen ezetimibe (ZETIA) 10 MG tablet Take 1 tablet (10 mg total) by mouth daily.  . fexofenadine (ALLEGRA) 180 MG tablet Take 180 mg by mouth daily.   . fluticasone (FLONASE) 50 MCG/ACT nasal spray Place 1 spray into both nostrils daily.  . simvastatin (ZOCOR) 40 MG tablet Take 1 tablet (40 mg total) by mouth daily.  Marland Kitchen triamcinolone (NASACORT ALLERGY  24HR) 55 MCG/ACT AERO nasal inhaler Place 2 sprays into the nose daily as needed (allergies).  . triamcinolone ointment (KENALOG) 0.1 % Apply 1 application topically 2 (two) times daily as needed (rash).  Marland Kitchen ZYBAN 150 MG 12 hr tablet Take 1 tablet (150 mg total) by mouth 2 (two) times daily.  . [DISCONTINUED] losartan (COZAAR) 25 MG tablet Take 1 tablet (25 mg total) by mouth daily.     Allergies  Allergen Reactions  . Aspirin Other (See Comments)    Sneezes, watery nose, wheeze (no polyps)   . Contrast Media [Iodinated Diagnostic Agents]     Need to be pre-medicated, sneezing, watery eyes  . Erythromycin Hives    Childhood allergy All mycin drugs   . Penicillins     Childhood allergy Has patient had a PCN reaction causing immediate rash, facial/tongue/throat swelling, SOB or lightheadedness with hypotension: Unknown Has patient had a PCN reaction causing severe rash involving mucus membranes or skin necrosis: Unknown Has patient had a PCN reaction that required hospitalization: No Has patient had a PCN reaction occurring within the last 10 years: No States he has had benadryl when taking this and had no problems recently    . Sulfa Antibiotics     unknown    Social History   Socioeconomic History  . Marital status: Married    Spouse name: Olin Hauser  . Number of children: 2  . Years of education: Not on file  . Highest education level: Not on file  Occupational History  . Occupation: textile manf  Tobacco Use  . Smoking status: Current Every Day Smoker    Packs/day: 1.00    Years: 50.00    Pack years: 50.00    Types: Cigarettes  . Smokeless tobacco: Never Used  Substance and Sexual Activity  . Alcohol use: Yes    Comment: 10/30/2016 "might have a few drinks 4-5 times/year"  . Drug use: No  . Sexual activity: Not Currently  Other Topics Concern  . Not on file  Social History Narrative  . Not on file   Social Determinants of Health   Financial Resource Strain:   .  Difficulty of Paying Living Expenses:   Food Insecurity:   . Worried About Charity fundraiser in the Last Year:   . Arboriculturist in the Last Year:   Transportation Needs:   . Film/video editor (Medical):   Marland Kitchen Lack of Transportation (Non-Medical):   Physical Activity:   . Days of Exercise per Week:   . Minutes of Exercise per Session:   Stress:   . Feeling of Stress :   Social Connections:   . Frequency of Communication with Friends and Family:   . Frequency of Social Gatherings with Friends and Family:   . Attends Religious Services:   . Active Member of Clubs or Organizations:   . Attends Archivist Meetings:   Marland Kitchen Marital Status:   Intimate Partner Violence:   .  Fear of Current or Ex-Partner:   . Emotionally Abused:   Marland Kitchen Physically Abused:   . Sexually Abused:      Review of Systems: General: negative for chills, fever, night sweats or weight changes.  Cardiovascular: negative for chest pain, dyspnea on exertion, edema, orthopnea, palpitations, paroxysmal nocturnal dyspnea or shortness of breath Dermatological: negative for rash Respiratory: negative for cough or wheezing Urologic: negative for hematuria Abdominal: negative for nausea, vomiting, diarrhea, bright red blood per rectum, melena, or hematemesis Neurologic: negative for visual changes, syncope, or dizziness All other systems reviewed and are otherwise negative except as noted above.    Blood pressure (!) 174/90, pulse 62, height 5\' 10"  (1.778 m), weight 202 lb (91.6 kg), SpO2 94 %.  General appearance: alert and no distress Neck: no adenopathy, no carotid bruit, no JVD, supple, symmetrical, trachea midline and thyroid not enlarged, symmetric, no tenderness/mass/nodules Lungs: clear to auscultation bilaterally Heart: regular rate and rhythm, S1, S2 normal, no murmur, click, rub or gallop Extremities: extremities normal, atraumatic, no cyanosis or edema Pulses: 2+ and symmetric Skin: Skin color,  texture, turgor normal. No rashes or lesions Neurologic: Alert and oriented X 3, normal strength and tone. Normal symmetric reflexes. Normal coordination and gait  EKG normal sinus rhythm at 62 with septal Q waves.  I personally reviewed this EKG.  ASSESSMENT AND PLAN:   Peripheral arterial disease ([place]) History of PAD status post bilateral common iliac artery PTA and stenting for known right common iliac CTO and physiologically significant left common iliac artery stenosis.  I did try to cross a mid right SFA CTO unsuccessfully 11/30/2016.  He currently denies claudication.  We will recheck lower extremity arterial Doppler studies.  Coronary artery disease History of CAD status post RCA stenting by Dr. Rex Kras in 2005 with a known chronically occluded circumflex.  He currently denies chest pain.  Hyperlipidemia History of hyperlipidemia on simvastatin and Zetia.  We will recheck a lipid liver profile today.  Tobacco abuse Ongoing tobacco abuse of 1 pack/day recalcitrant risk factor modification.  Essential hypertension History of essential hypertension blood pressure measured today 174/90 although when he measured at home yesterday was significantly lower than this.  He is on low-dose losartan with no he complains of a dry hacking cough from this.  Can stop losartan start amlodipine 5 mg a day and have him keep a 30-day blood pressure log.  He will see one of our pharmacist back in 4 weeks to review make appropriate changes.      [name] MD FACP,FACC,FAHA, [facility] 08/27/2019 9:48 AM

## 2019-08-27 NOTE — Patient Instructions (Signed)
Medication Instructions:  Stop Losartan Start Amlodipine 5 mg daily  *keep BP log at home for 30 days*  *If you need a refill on your cardiac medications before your next appointment, please call your pharmacy*   Lab Work: LIPID and LIVER today   If you have labs (blood work) drawn today and your tests are completely normal, you will receive your results only by: Marland Kitchen MyChart Message (if you have MyChart) OR . A paper copy in the mail If you have any lab test that is abnormal or we need to change your treatment, we will call you to review the results.   Testing/Procedures: Your physician has requested that you have a lower or upper extremity arterial duplex. This test is an ultrasound of the arteries in the legs or arms. It looks at arterial blood flow in the legs and arms. Allow one hour for Lower and Upper Arterial scans. There are no restrictions or special instructions   Follow-Up: At [facility], you and your health needs are our priority.  As part of our continuing mission to provide you with exceptional heart care, we have created designated Provider Care Teams.  These Care Teams include your primary Cardiologist (physician) and Advanced Practice Providers (APPs -  Physician Assistants and Nurse Practitioners) who all work together to provide you with the care you need, when you need it.  We recommend signing up for the patient portal called "MyChart".  Sign up information is provided on this After Visit Summary.  MyChart is used to connect with patients for Virtual Visits (Telemedicine).  Patients are able to view lab/test results, encounter notes, upcoming appointments, etc.  Non-urgent messages can be sent to your provider as well.   To learn more about what you can do with MyChart, go to [email or web address].    Your next appointment:   12 month(s)  The format for your next appointment:   In Person  Provider:   [name], MD   Other Instructions Make 4 week  follow up appointment with [facility].

## 2019-08-27 NOTE — Assessment & Plan Note (Signed)
History of hyperlipidemia on simvastatin and Zetia.  We will recheck a lipid liver profile today.

## 2019-08-27 NOTE — Assessment & Plan Note (Signed)
Ongoing tobacco abuse of 1 pack/day recalcitrant risk factor modification. 

## 2019-08-27 NOTE — Assessment & Plan Note (Signed)
History of essential hypertension blood pressure measured today 174/90 although when he measured at home yesterday was significantly lower than this.  He is on low-dose losartan with no he complains of a dry hacking cough from this.  Can stop losartan start amlodipine 5 mg a day and have him keep a 30-day blood pressure log.  He will see one of our pharmacist back in 4 weeks to review make appropriate changes.

## 2019-08-28 ENCOUNTER — Other Ambulatory Visit: Payer: Self-pay

## 2019-08-28 DIAGNOSIS — I2510 Atherosclerotic heart disease of native coronary artery without angina pectoris: Secondary | ICD-10-CM

## 2019-08-28 DIAGNOSIS — E782 Mixed hyperlipidemia: Secondary | ICD-10-CM

## 2019-08-28 MED ORDER — CLOPIDOGREL BISULFATE 75 MG PO TABS: 75.0000 mg | ORAL_TABLET | Freq: Every day | ORAL | 1 refills | Status: DC

## 2019-09-02 NOTE — Addendum Note (Signed)
Addended by: Zebedee Iba on: 09/02/2019 11:58 AM   Modules accepted: Orders

## 2019-09-04 ENCOUNTER — Other Ambulatory Visit (HOSPITAL_COMMUNITY): Payer: Self-pay | Admitting: Cardiovascular Disease

## 2019-09-04 DIAGNOSIS — Z95828 Presence of other vascular implants and grafts: Secondary | ICD-10-CM

## 2019-09-10 ENCOUNTER — Inpatient Hospital Stay (HOSPITAL_COMMUNITY): Admission: RE | Admit: 2019-09-10 | Payer: Medicare Other | Source: Ambulatory Visit

## 2019-09-13 DIAGNOSIS — W57XXXA Bitten or stung by nonvenomous insect and other nonvenomous arthropods, initial encounter: Secondary | ICD-10-CM | POA: Diagnosis not present

## 2019-09-13 DIAGNOSIS — Z79899 Other long term (current) drug therapy: Secondary | ICD-10-CM | POA: Diagnosis not present

## 2019-09-13 DIAGNOSIS — Z1159 Encounter for screening for other viral diseases: Secondary | ICD-10-CM | POA: Diagnosis not present

## 2019-09-13 DIAGNOSIS — R197 Diarrhea, unspecified: Secondary | ICD-10-CM | POA: Diagnosis not present

## 2019-09-13 DIAGNOSIS — R109 Unspecified abdominal pain: Secondary | ICD-10-CM | POA: Diagnosis not present

## 2019-09-16 ENCOUNTER — Telehealth: Payer: Self-pay | Admitting: Internal Medicine

## 2019-09-16 NOTE — Telephone Encounter (Signed)
Olin Hauser returning a phone call. Olin Hauser can be reached at [phone].

## 2019-09-16 NOTE — Telephone Encounter (Signed)
Happy to see him- next routine available

## 2019-09-16 NOTE — Telephone Encounter (Signed)
Spoke with pt's wife. Pt has been scheduled with Dr. Annamaria Boots on 10/13/2019 at 1100. Nothing further was needed.

## 2019-09-16 NOTE — Telephone Encounter (Signed)
Spoke with pt's wife. Advised her that pt would be considered a new pt since he has not been seen in over 3 years.  Dr. Annamaria Boots - please advise if you are okay with [place] scheduling the pt an appointment with you. Thanks!

## 2019-09-16 NOTE — Telephone Encounter (Signed)
LMTCB x1 for pt's wife, Olin Hauser.

## 2019-09-23 ENCOUNTER — Encounter (HOSPITAL_COMMUNITY): Payer: Medicare Other

## 2019-09-25 DIAGNOSIS — Z1211 Encounter for screening for malignant neoplasm of colon: Secondary | ICD-10-CM | POA: Diagnosis not present

## 2019-09-25 DIAGNOSIS — Z9229 Personal history of other drug therapy: Secondary | ICD-10-CM | POA: Diagnosis not present

## 2019-09-25 DIAGNOSIS — R197 Diarrhea, unspecified: Secondary | ICD-10-CM | POA: Diagnosis not present

## 2019-10-07 ENCOUNTER — Other Ambulatory Visit: Payer: Self-pay | Admitting: Cardiovascular Disease

## 2019-10-07 ENCOUNTER — Ambulatory Visit (INDEPENDENT_AMBULATORY_CARE_PROVIDER_SITE_OTHER): Payer: Medicare Other | Admitting: Pharmacist

## 2019-10-07 ENCOUNTER — Other Ambulatory Visit: Payer: Self-pay

## 2019-10-07 VITALS — BP 138/76 | HR 90 | Ht 70.0 in | Wt 195.8 lb

## 2019-10-07 DIAGNOSIS — E782 Mixed hyperlipidemia: Secondary | ICD-10-CM | POA: Diagnosis not present

## 2019-10-07 DIAGNOSIS — I10 Essential (primary) hypertension: Secondary | ICD-10-CM

## 2019-10-07 DIAGNOSIS — Z720 Tobacco use: Secondary | ICD-10-CM | POA: Diagnosis not present

## 2019-10-07 DIAGNOSIS — I2510 Atherosclerotic heart disease of native coronary artery without angina pectoris: Secondary | ICD-10-CM | POA: Diagnosis not present

## 2019-10-07 MED ORDER — ROSUVASTATIN CALCIUM 20 MG PO TABS: 20.0000 mg | ORAL_TABLET | Freq: Every day | ORAL | 1 refills | Status: DC

## 2019-10-07 MED ORDER — AMLODIPINE BESYLATE 10 MG PO TABS: 10.0000 mg | ORAL_TABLET | Freq: Every day | ORAL | 1 refills | Status: DC

## 2019-10-07 NOTE — Progress Notes (Signed)
Patient ID: Brady CAMARENA Sr.                 DOB: March 21, 1951                      MRN: [id number]     HPI: Brady SEAR Sr. is a 69 y.o. male referred by Dr. Gwenlyn Found to HTN clinic. PMH includes CAD s/p stent placement, tobacco abuse, hypertension, COPD, and hyperlipidemia. Denied dizziness, chest pain,sweling or increase fatigue. Reports improved cough since stopping losartan. He is doing well on amlodipine but home BP remains mainly above goal. Also noted recent LDL of 155, but patient admits he was NOT taking simvastatin for at least 1 months before blood work and stopped ezetimibe months ago for unknown reason. Patient continues to smoke,  but is not ready to quit yet.  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[place] , gardening during time off work   Home BP readings:  13 morning readings: average 140/73 (HR range 72-85) 13 evening readings: average 141/76 (HR range 72-86)  Wt Readings from Last 3 Encounters:  10/07/19 195 lb 12.8 oz (88.8 kg)  08/27/19 202 lb (91.6 kg)  03/28/17 212 lb 3.2 oz (96.3 kg)   BP Readings from Last 3 Encounters:  10/07/19 138/76  08/27/19 (!) 174/90  03/28/17 (!) 159/86   Pulse Readings from Last 3 Encounters:  10/07/19 90  08/27/19 62  03/28/17 (!) 105    Past Medical History:  Diagnosis Date   COPD (chronic obstructive pulmonary disease) ([place])    Coronary artery disease    a. s/p RCA stenting back in 2005 with a known chronically occluded circumflex and normal LV function.   Emphysema lung ([facility])    GERD (gastroesophageal reflux disease)    Heart attack ([place]) <2005   History of tobacco abuse    Hyperlipidemia    Hypertension     Lung nodule    a. Suspicious for malignancy, undergoing workup.   Obesity    Peripheral arterial disease ([place])    a. history of stent to right common iliac artery 12/15/99 with a peak for balloon-expandable stent. b. s/p intervention on RCIA total occlusion PTA and stent, residual disease on the right for possible staged intervention, notable disease on the left but asymptomatic in 2015. c. 10/2016: s/p PTA and stenting of left common iliac    Current Outpatient Medications on File Prior to Visit  Medication Sig Dispense Refill   clopidogrel (PLAVIX) 75 MG tablet Take 1 tablet (75 mg total) by mouth daily. 90 tablet 1   Coenzyme Q10 (COQ10) 100 MG CAPS Take 1 capsule by mouth daily.     diphenhydrAMINE (BENADRYL) 25 mg capsule Take 25 mg by mouth every 8 (eight) hours as needed for allergies.     fexofenadine (ALLEGRA) 180 MG tablet Take 180 mg by mouth daily.      fluticasone (FLONASE) 50 MCG/ACT nasal spray Place 1 spray into both nostrils daily. 16 g 6   triamcinolone (NASACORT ALLERGY 24HR) 55 MCG/ACT AERO nasal inhaler Place 2 sprays into the nose daily as needed (allergies).     No current  facility-administered medications on file prior to visit.    Allergies  Allergen Reactions   Aspirin Other (See Comments)    Sneezes, watery nose, wheeze (no polyps)    Contrast Media [Iodinated Diagnostic Agents]     Need to be pre-medicated, sneezing, watery eyes   Erythromycin Hives    Childhood allergy All mycin drugs    Penicillins     Childhood allergy Has patient had a PCN reaction causing immediate rash, facial/tongue/throat swelling, SOB or lightheadedness with hypotension: Unknown Has patient had a PCN reaction causing severe rash involving mucus membranes or skin necrosis: Unknown Has patient had a PCN reaction that required hospitalization: No Has patient had a PCN reaction occurring within the last 10 years: No States he has had benadryl when taking this and had no  problems recently     Simvastatin Other (See Comments)    Hip pain   Sulfa Antibiotics     unknown    Blood pressure 138/76, pulse 90, height 5\' 10"  (1.778 m), weight 195 lb 12.8 oz (88.8 kg), SpO2 93 %.  Tobacco abuse Patient failed bupropion and unable to tolerate Chantix. He is currently working on decreasing amount of cigarettes per day with his wife. They are both smokers. Currently in contemplation phase.  Will follow up during next OV and offer nicotine patches & gum as an alternative  Hyperlipidemia LDL of 155 mg/dL remains above desired goal for secondary prevention,  but patient was not taking simvastatin or ezetimibe as prescribed. Patient reports hip pain while taking simvastatin 40mg , but  He can not remember reason to stop ezetimibe.  Will change therapy to rosuvastatin 20mg  daily to simply regimens and avoid DDI with amolodipine. Plan to repeat fasting lipid panel in 6-8 weeks.  Essential hypertension BP greatly improved, but remains above desired goal of <130/80. Patient developed cough while on losartan and will prefer to avoid diuretic for now. Therapy  was changed to amlodipine 5mg  daily by Dr. Gwenlyn Found and patient tolerating well. Will increase amlodipine to 10mg  daily and follow up in 4 weeks at hypertension clinic.    Abbeygail Igoe Rodriguez-Guzman PharmD, BCPS, [place] [address] 10/09/2019 5:41 PM

## 2019-10-07 NOTE — Patient Instructions (Signed)
Return for a follow up appointment in 4 weeks  Check your blood pressure at home daily (if able) and keep record of the readings.  Take your BP meds as follows: * STOP simvastatin and ezetimibe* *START rosuvastatin 20mg  daily* *INCREASE amlodipine to 10mg  daily*  Bring all of your meds, your BP cuff and your record of home blood pressures to your next appointment.  Exercise as you're able, try to walk approximately 30 minutes per day.  Keep salt intake to a minimum, especially watch canned and prepared boxed foods.  Eat more fresh fruits and vegetables and fewer canned items.  Avoid eating in fast food restaurants.    HOW TO TAKE YOUR BLOOD PRESSURE: . Rest 5 minutes before taking your blood pressure. .  Don't smoke or drink caffeinated beverages for at least 30 minutes before. . Take your blood pressure before (not after) you eat. . Sit comfortably with your back supported and both feet on the floor (don't cross your legs). . Elevate your arm to heart level on a table or a desk. . Use the proper sized cuff. It should fit smoothly and snugly around your bare upper arm. There should be enough room to slip a fingertip under the cuff. The bottom edge of the cuff should be 1 inch above the crease of the elbow. . Ideally, take 3 measurements at one sitting and record the average.

## 2019-10-09 ENCOUNTER — Encounter: Payer: Self-pay | Admitting: Pharmacist

## 2019-10-09 NOTE — Assessment & Plan Note (Signed)
BP greatly improved, but remains above desired goal of <130/80. Patient developed cough while on losartan and will prefer to avoid diuretic for now. Therapy  was changed to amlodipine 5mg  daily by Dr. Gwenlyn Found and patient tolerating well. Will increase amlodipine to 10mg  daily and follow up in 4 weeks at hypertension clinic.

## 2019-10-09 NOTE — Assessment & Plan Note (Signed)
LDL of 155 mg/dL remains above desired goal for secondary prevention,  but patient was not taking simvastatin or ezetimibe as prescribed. Patient reports hip pain while taking simvastatin 40mg , but  He can not remember reason to stop ezetimibe.  Will change therapy to rosuvastatin 20mg  daily to simply regimens and avoid DDI with amolodipine. Plan to repeat fasting lipid panel in 6-8 weeks.

## 2019-10-09 NOTE — Assessment & Plan Note (Signed)
Patient failed bupropion and unable to tolerate Chantix. He is currently working on decreasing amount of cigarettes per day with his wife. They are both smokers. Currently in contemplation phase.  Will follow up during next OV and offer nicotine patches & gum as an alternative

## 2019-10-13 ENCOUNTER — Other Ambulatory Visit: Payer: Self-pay

## 2019-10-13 ENCOUNTER — Ambulatory Visit (INDEPENDENT_AMBULATORY_CARE_PROVIDER_SITE_OTHER): Payer: Medicare Other | Admitting: Internal Medicine

## 2019-10-13 ENCOUNTER — Encounter: Payer: Self-pay | Admitting: Internal Medicine

## 2019-10-13 VITALS — BP 142/78 | HR 83 | Temp 98.3°F | Ht 70.0 in | Wt 194.2 lb

## 2019-10-13 DIAGNOSIS — I2510 Atherosclerotic heart disease of native coronary artery without angina pectoris: Secondary | ICD-10-CM | POA: Diagnosis not present

## 2019-10-13 DIAGNOSIS — J849 Interstitial pulmonary disease, unspecified: Secondary | ICD-10-CM | POA: Diagnosis not present

## 2019-10-13 DIAGNOSIS — J449 Chronic obstructive pulmonary disease, unspecified: Secondary | ICD-10-CM

## 2019-10-13 DIAGNOSIS — Z720 Tobacco use: Secondary | ICD-10-CM | POA: Diagnosis not present

## 2019-10-13 DIAGNOSIS — R911 Solitary pulmonary nodule: Secondary | ICD-10-CM

## 2019-10-13 MED ORDER — BREZTRI AEROSPHERE 160-9-4.8 MCG/ACT IN AERO: 2.0000 | INHALATION_SPRAY | Freq: Two times a day (BID) | RESPIRATORY_TRACT | 0 refills | Status: DC

## 2019-10-13 MED ORDER — FLUTICASONE PROPIONATE 50 MCG/ACT NA SUSP: 1.0000 | Freq: Every day | NASAL | 4 refills | Status: DC

## 2019-10-13 NOTE — Progress Notes (Signed)
HPI male one pack per day Smoker followed for lung nodules, tobacco abuse, COPD, complicated by CAD, PAD/claudication, HBP, Aspirin Allergy CT chest  03/06/14 2 incidental pulmonary nodules noted in the lungs bilaterally PFT 12/02/14-severe obstructive airways disease with insignificant response to bronchodilator, air trapping, moderately reduced diffusion. FEV1/FVC 0.59, RV 139%, TLC 93%, DLCO 62%.  ----------------------------------------------------------------------------------------------- 01/12/2016-69 year old male one pack per day smoker followed for lung nodules, tobacco abuse, COPD, complicated by CAD, PAD/claudication, HBP, aspirin allergy FOLLOWS FOR: Pt states he is doing well overall; denies any more than usual SOB, cough, or congestion at this time. Fall is his worse season. Review CT Chest from 01-07-16 with patient. He continues to smoke and we discussed this again, offering support. He wants to get Prevnar vaccine today and will get flu vaccine at his drugstore in a couple of weeks. Cough produces either scant clear mucus, ordered none. Much postnasal drip. Allegra is some help. He had forgotten about Flonase which helped in the past. CT chest 01/07/2016- reviewed with him. Small stable nodules appear benign. IMPRESSION: 1. Scattered pulmonary nodules are all stable since 03/06/2014 and are considered benign. 2. Moderate centrilobular and paraseptal emphysema with diffuse bronchial wall thickening, suggesting COPD. 3. Aortic atherosclerosis. Left main and 3 vessel coronary atherosclerosis. Electronically Signed   By: Ilona Sorrel M.D.   On: 01/07/2016 10:53  10/04/19- 69 year old male one pack per day smoker followed for lung nodules, tobacco abuse, COPD, complicated by CAD, PAD/claudication, HBP, aspirin allergy Continues to smoke. Notes DOE in shower and as he lies down, without recent change. A little dry cough. Had a diarrheal illness, now resolved, which was treated at  [facility]. CXR report commented on pneumonitis vs fibrosis, and his wife asked if a pneumonia could have caused his GI distress.  Flonase helps nasal congestion- needs refill.  Not currently on any inhalers for chest. Had 2 Phizer Covax.  Followed by cardiology for significant peripheral arterial disease.  CXR 10/24/16 IMPRESSION: 1. Peribronchial thickening consistent with bronchitis. 2. No active process.   ROS-see HPI     + = positve Constitutional:   No-   weight loss, night sweats, fevers, chills, fatigue, lassitude. HEENT:   No-  headaches, difficulty swallowing, tooth/dental problems, sore throat,       No-  sneezing, itching, ear ache, nasal congestion, +post nasal drip,  CV:  No-   chest pain, orthopnea, PND, swelling in lower extremities, anasarca,  dizziness, palpitations Resp: +   shortness of breath with exertion or at rest.              No-   productive cough,  + non-productive cough,  No- coughing up of blood.              No-   change in color of mucus.  No- wheezing.   Skin: No-   rash or lesions. GI:  No-   heartburn, indigestion, abdominal pain, nausea, vomiting GU:  MS:  No-   joint pain or swelling.   Neuro-     nothing unusual Psych:  No- change in mood or affect. No depression or anxiety.  No memory loss.  OBJ- Physical Exam General- Alert, Oriented, Affect-appropriate, Distress- none acute,  Skin- rash-none, lesions- none, excoriation- none, + plethoric or mild sunburn Lymphadenopathy- none Head- atraumatic            Eyes- Gross vision intact, PERRLA, conjunctivae and secretions clear            Ears- Hearing okay,  Nose- +turbinate edema, no-Septal dev, mucus, polyps, erosion, perforation             Throat- Mallampati III , mucosa clear , drainage- none, tonsils- atrophic, own teeth Neck- flexible , trachea midline, no stridor , thyroid nl, carotid no bruit Chest - symmetrical excursion , unlabored           Heart/CV- RRR , no murmur , no  gallop  , no rub, nl s1 s2                           - JVD- none , edema- none, stasis changes- none, varices- none           Lung-  unlabored, + crackles L base, wheeze- none, cough- none , dullness-none, rub- none           Chest wall-  Abd- Br/ Gen/ Rectal- Not done, not indicated Extrem- cyanosis- none, clubbing, none, atrophy- none, strength- nl Neuro- grossly intact to observation

## 2019-10-13 NOTE — Patient Instructions (Signed)
Order- CT chest high resolution, no contrast    Dx multiple lung nodules, ILD   Sample x 2 Breztri inhaler    Inhale 2 puffs then rinse mouth, twice daily     If Breztri seems to improve your breathing, please let us know and we can send a prescription.   Refill script sent for Flonase nasal spray  Please call if we can help

## 2019-10-15 NOTE — Assessment & Plan Note (Signed)
Relatively little bronchitis symptomatically now. Discussed trial of maintenance controller- Breztri.  Plan- samples [employer]

## 2019-10-15 NOTE — Assessment & Plan Note (Signed)
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

## 2019-10-15 NOTE — Assessment & Plan Note (Signed)
Continue to pres him and wife to stop. Can offer referral to our pharmacy team if he will agree to try.

## 2019-10-17 DIAGNOSIS — Z1211 Encounter for screening for malignant neoplasm of colon: Secondary | ICD-10-CM | POA: Diagnosis not present

## 2019-10-17 DIAGNOSIS — Z01818 Encounter for other preprocedural examination: Secondary | ICD-10-CM | POA: Diagnosis not present

## 2019-10-17 DIAGNOSIS — K635 Polyp of colon: Secondary | ICD-10-CM | POA: Diagnosis not present

## 2019-10-21 ENCOUNTER — Other Ambulatory Visit: Payer: Self-pay

## 2019-10-21 ENCOUNTER — Ambulatory Visit (HOSPITAL_COMMUNITY)
Admission: RE | Admit: 2019-10-21 | Discharge: 2019-10-21 | Disposition: A | Discharge status: Home / Self Care | Payer: Medicare Other | Source: Ambulatory Visit | Attending: Cardiovascular Disease | Admitting: Cardiovascular Disease

## 2019-10-21 ENCOUNTER — Other Ambulatory Visit (HOSPITAL_COMMUNITY): Payer: Self-pay | Admitting: Cardiovascular Disease

## 2019-10-21 DIAGNOSIS — I739 Peripheral vascular disease, unspecified: Secondary | ICD-10-CM | POA: Diagnosis not present

## 2019-10-21 DIAGNOSIS — Z95828 Presence of other vascular implants and grafts: Secondary | ICD-10-CM

## 2019-10-22 ENCOUNTER — Telehealth: Payer: Self-pay

## 2019-10-22 ENCOUNTER — Ambulatory Visit (HOSPITAL_COMMUNITY)
Admission: RE | Admit: 2019-10-22 | Discharge: 2019-10-22 | Disposition: A | Discharge status: Home / Self Care | Payer: Medicare Other | Source: Ambulatory Visit | Attending: Internal Medicine | Admitting: Internal Medicine

## 2019-10-22 DIAGNOSIS — J849 Interstitial pulmonary disease, unspecified: Secondary | ICD-10-CM | POA: Diagnosis not present

## 2019-10-22 DIAGNOSIS — R911 Solitary pulmonary nodule: Secondary | ICD-10-CM | POA: Insufficient documentation

## 2019-10-22 DIAGNOSIS — I739 Peripheral vascular disease, unspecified: Secondary | ICD-10-CM

## 2019-10-22 DIAGNOSIS — Z95828 Presence of other vascular implants and grafts: Secondary | ICD-10-CM

## 2019-10-22 NOTE — Telephone Encounter (Signed)
Called patient aorta doppler results left on personal voice mail.Advised to repeat in 12 months.

## 2019-10-22 NOTE — Telephone Encounter (Signed)
Call report for Dr. Melvyn Novas: Ct High Resolution   IMPRESSION: 1. New patchy clustered nodular peribronchovascular foci of consolidation in the medial basilar right lower lobe, most likely a mild bronchopneumonia. Recommend attention on post treatment follow-up chest CT in 3 months. 2. Previously described scattered small pulmonary nodules in both lungs are all stable since 01/07/2016 chest CT and considered benign. 3. Severe centrilobular and paraseptal emphysema with diffuse bronchial wall thickening, suggesting COPD. 4. Three-vessel coronary atherosclerosis. 5. Aortic Atherosclerosis (ICD10-I70.0) and Emphysema (ICD10-J43.9).  These results will be called to the ordering clinician or representative by the Radiologist Assistant, and communication documented in the PACS or [employer].   Electronically Signed   By: Ilona Sorrel M.D.   On: 10/22/2019 15:18

## 2019-10-23 MED ORDER — DOXYCYCLINE HYCLATE 100 MG PO TABS
100.0000 mg | ORAL_TABLET | Freq: Two times a day (BID) | ORAL | 0 refills | Status: DC
Start: 2019-10-23 — End: 2019-12-11

## 2019-10-23 NOTE — Telephone Encounter (Signed)
Baird Lyons D, MD sent to [facility] Triage Pool CT chest- There is a small patchy area of probable pneumonia in the right lung for which I recommend an antibiotic. There are several stable nodules which are likely benign. There is severe emphysema with COPD. There is calcified coronary artery disease.   Recommend we order doxycycline 100 mg, # 8  2 today then one daily, for the small area of pneumonia.        Spoke with the pt and notified of results/recs and he verbalized understanding Rx sent for abx

## 2019-10-23 NOTE — Telephone Encounter (Signed)
Pt returning a phone call. Pt can be reached at [phone].

## 2019-10-23 NOTE — Telephone Encounter (Signed)
Pt returning call.  [phone].

## 2019-10-23 NOTE — Progress Notes (Signed)
Spoke with pt and notified of results per Dr. Young Pt verbalized understanding and denied any questions. 

## 2019-10-24 DIAGNOSIS — K635 Polyp of colon: Secondary | ICD-10-CM | POA: Diagnosis not present

## 2019-10-24 DIAGNOSIS — K529 Noninfective gastroenteritis and colitis, unspecified: Secondary | ICD-10-CM | POA: Diagnosis not present

## 2019-11-04 ENCOUNTER — Encounter: Payer: Self-pay | Admitting: Pharmacist

## 2019-11-04 ENCOUNTER — Ambulatory Visit (INDEPENDENT_AMBULATORY_CARE_PROVIDER_SITE_OTHER): Payer: Medicare Other | Admitting: Pharmacist

## 2019-11-04 ENCOUNTER — Telehealth (INDEPENDENT_AMBULATORY_CARE_PROVIDER_SITE_OTHER): Payer: Medicare Other

## 2019-11-04 ENCOUNTER — Other Ambulatory Visit: Payer: Self-pay

## 2019-11-04 VITALS — BP 132/72 | HR 90

## 2019-11-04 DIAGNOSIS — I10 Essential (primary) hypertension: Secondary | ICD-10-CM | POA: Diagnosis not present

## 2019-11-04 DIAGNOSIS — I493 Ventricular premature depolarization: Secondary | ICD-10-CM | POA: Diagnosis not present

## 2019-11-04 DIAGNOSIS — I2510 Atherosclerotic heart disease of native coronary artery without angina pectoris: Secondary | ICD-10-CM | POA: Diagnosis not present

## 2019-11-04 MED ORDER — HYDROCHLOROTHIAZIDE 12.5 MG PO CAPS: 12.5000 mg | ORAL_CAPSULE | Freq: Every day | ORAL | 1 refills | Status: DC

## 2019-11-04 NOTE — Progress Notes (Signed)
Patient ID: AYOUB AREY Sr.                 DOB: [birth year]                      MRN: [id number]     HPI: Brady VANSICKLE Sr. is a 69 y.o. male referred by Dr. Gwenlyn Found to HTN and lipid clinic. PMH is significant for CAD s/p stent placement, tobacco abuse, hypertension, COPD, and hyperlipidemia.  Seen by Harrington Challenger on 10/07/19 and BP/LDL still above goal.  Simvastatin was changed to rosuvastatin and amlodipine was increased to 10 mg.  Has been tolerating rosuvastatin 20 mg without any adverse effects.  Presents today for 4 week follow up.  Brought BP log, blood pressure tending down over last month.  6/18: 142/74, 146,48 6/19: 138/69, 148/81 6/20: 124/71, 143/67 6/21: 127/70, 138/71 6/24: 134/72, 140/63 6/25: 128/61, 124/67 6/26: 105/70, 127/74 6/27: 115/72, 126/68 6/28: 127/72, 139/72 6/29: 118/68, 148/71 6/30: 132/68, 140/70 7/1: 140/82, 131/71 7/3: 120/72, 132/71 7/4: 133/70, 134/72 7/5: 121/76, 132/65 7/6: 121/73  Patient takes morning BP after awakening.  Takes evening blood pressure around 5-6 pm, takes amlodipine 1 hour later.  Goes to bed around midnight.  Current every day smoker and reports he does not follow a particular diet.  Was scheduled for lipid panel on 6/29 but patient forgot.  Is not fasting today.  Current HTN meds: amlodipine 10 mg Current HLD meds: rosuvastatin 20 mg Previously tried: losartan - cough BP goal: <130/80 LDL goal: < 70  Social History: current 1/2 PPD smoker   Wt Readings from Last 3 Encounters:  10/13/19 194 lb 3.2 oz (88.1 kg)  10/07/19 195 lb 12.8 oz (88.8 kg)  08/27/19 202 lb (91.6 kg)   BP Readings from Last 3 Encounters:  10/13/19 (!) 142/78  10/07/19 138/76  08/27/19 (!) 174/90   Pulse Readings from Last 3 Encounters:  10/13/19 83  10/07/19 90  08/27/19 62    Renal function: CrCl cannot be calculated (Patient's most recent lab result is older than the maximum 21 days allowed.).  Past Medical  History:  Diagnosis Date  . COPD (chronic obstructive pulmonary disease) ([place])   . Coronary artery disease    a. s/p RCA stenting back in 2005 with a known chronically occluded circumflex and normal LV function.  . Emphysema lung ([place])   . GERD (gastroesophageal reflux disease)   . Heart attack ([place]) <2005  . History of tobacco abuse   . Hyperlipidemia   . Hypertension   . Lung nodule    a. Suspicious for malignancy, undergoing workup.  . Obesity   . Peripheral arterial disease ([place])    a. history of stent to right common iliac artery 12/15/99 with a peak for balloon-expandable stent. b. s/p intervention on RCIA total occlusion PTA and stent, residual disease on the right for possible staged intervention, notable disease on the left but asymptomatic in 2015. c. 10/2016: s/p PTA and stenting of left common iliac    Current Outpatient Medications on File Prior to Visit  Medication Sig Dispense Refill  . amLODipine (NORVASC) 10 MG tablet TAKE 1 TABLET(10 MG) BY MOUTH DAILY 90 tablet 0  . Budeson-Glycopyrrol-Formoterol (BREZTRI AEROSPHERE) 160-9-4.8 MCG/ACT AERO Inhale 2 puffs into the lungs in the morning and at bedtime. 5.9 g 0  . clopidogrel (PLAVIX) 75 MG tablet Take 1 tablet (75 mg total) by mouth daily. 90 tablet 1  . Coenzyme Q10 (COQ10)  100 MG CAPS Take 1 capsule by mouth daily.    . diphenhydrAMINE (BENADRYL) 25 mg capsule Take 25 mg by mouth every 8 (eight) hours as needed for allergies.    Marland Kitchen doxycycline (VIBRA-TABS) 100 MG tablet Take 1 tablet (100 mg total) by mouth 2 (two) times daily. 8 tablet 0  . fexofenadine (ALLEGRA) 180 MG tablet Take 180 mg by mouth daily.     . fluticasone (FLONASE) 50 MCG/ACT nasal spray Place 1 spray into both nostrils daily. 48 g 4  . rosuvastatin (CRESTOR) 20 MG tablet TAKE 1 TABLET(20 MG) BY MOUTH DAILY 90 tablet 0  . triamcinolone (NASACORT ALLERGY 24HR) 55 MCG/ACT AERO nasal inhaler Place 2 sprays into the nose daily as needed (allergies).     No  current facility-administered medications on file prior to visit.    Allergies  Allergen Reactions  . Aspirin Other (See Comments)    Sneezes, watery nose, wheeze (no polyps)   . Contrast Media [Iodinated Diagnostic Agents]     Need to be pre-medicated, sneezing, watery eyes  . Erythromycin Hives    Childhood allergy All mycin drugs   . Penicillins     Childhood allergy Has patient had a PCN reaction causing immediate rash, facial/tongue/throat swelling, SOB or lightheadedness with hypotension: Unknown Has patient had a PCN reaction causing severe rash involving mucus membranes or skin necrosis: Unknown Has patient had a PCN reaction that required hospitalization: No Has patient had a PCN reaction occurring within the last 10 years: No States he has had benadryl when taking this and had no problems recently    . Simvastatin Other (See Comments)    Hip pain  . Sulfa Antibiotics     unknown     Assessment/Plan:  1. Hypertension - BP today 132/72 which is above goal of 130/80, however is much improved from 1 month ago and has continued to trend down.  Recommended patient continue amlodipine 10 mg and recommended addition of HCTZ 12.5 mg to regimen.  Will check BMP in 1-2 weeks in addition to lipid panel.  While taking blood pressure, thought I heard an arrhythmia.  Patient asymptomatic.  Had nurse take EKG.  See scan in chart.   2 . Hyperlipidemia -  Continue rosuvastatin 20 mg once daily.  Recheck lipid panel.  Recheck in 4 weeks.  Karren Cobble, PharmD, BCACP, [place] [address], [place], [place] [address] Phone: [phone]; Fax: [phone] 11/04/2019 9:32 AM

## 2019-11-04 NOTE — Telephone Encounter (Signed)
Pt was seen in office today by hypertension clinic.During physical exam, Pharmacist reported hearing some form of arrhthymias. Pt voiced he feels fine and denies any symptoms.  EKG was performed and reviewed by Dr. Margaretann Loveless (DOD). MD voiced EKG shows occasional PVC's but nothing concerning and advised to keep scheduled appointment with Dr. Gwenlyn Found. Pt made aware and verbalized understanding.

## 2019-11-04 NOTE — Patient Instructions (Addendum)
It was great meeting you today!  Your blood pressure goal is <130/80 so you are almost there  Continue your amlodipine 10 mg daily  We are going to start hydrochlorothiazide 12.5 mg once daily in the morning  Please have your bloodwork checked in 1-2 weeks  Call us with any questions  Karren Cobble, PharmD, [facility] [place], [place] [address] [place], [place] [address] Phone: [phone]; Fax: [phone] 11/04/2019 9:11 AM

## 2019-11-06 DIAGNOSIS — D126 Benign neoplasm of colon, unspecified: Secondary | ICD-10-CM | POA: Diagnosis not present

## 2019-11-06 DIAGNOSIS — K648 Other hemorrhoids: Secondary | ICD-10-CM | POA: Diagnosis not present

## 2019-11-17 DIAGNOSIS — E782 Mixed hyperlipidemia: Secondary | ICD-10-CM | POA: Diagnosis not present

## 2019-11-17 DIAGNOSIS — I2510 Atherosclerotic heart disease of native coronary artery without angina pectoris: Secondary | ICD-10-CM | POA: Diagnosis not present

## 2019-11-18 LAB — HEPATIC FUNCTION PANEL
ALT: 30 IU/L (ref 0–44)
AST: 38 IU/L (ref 0–40)
Albumin: 4.4 g/dL (ref 3.8–4.8)
Alkaline Phosphatase: 71 IU/L (ref 48–121)
Bilirubin Total: 0.3 mg/dL (ref 0.0–1.2)
Bilirubin, Direct: 0.07 mg/dL (ref 0.00–0.40)
Total Protein: 6.8 g/dL (ref 6.0–8.5)

## 2019-11-18 LAB — LIPID PANEL
Chol/HDL Ratio: 2.4 ratio (ref 0.0–5.0)
Cholesterol, Total: 130 mg/dL (ref 100–199)
HDL: 55 mg/dL (ref >39)
LDL Chol Calc (NIH): 61 mg/dL (ref 0–99)
Triglycerides: 65 mg/dL (ref 0–149)
VLDL Cholesterol Cal: 14 mg/dL (ref 5–40)

## 2019-12-11 ENCOUNTER — Other Ambulatory Visit: Payer: Self-pay

## 2019-12-11 ENCOUNTER — Ambulatory Visit (INDEPENDENT_AMBULATORY_CARE_PROVIDER_SITE_OTHER): Payer: Medicare Other | Admitting: Pharmacist

## 2019-12-11 VITALS — BP 148/60 | HR 92 | Resp 17 | Ht 70.0 in | Wt 201.2 lb

## 2019-12-11 DIAGNOSIS — E782 Mixed hyperlipidemia: Secondary | ICD-10-CM | POA: Diagnosis not present

## 2019-12-11 DIAGNOSIS — I10 Essential (primary) hypertension: Secondary | ICD-10-CM | POA: Diagnosis not present

## 2019-12-11 DIAGNOSIS — Z720 Tobacco use: Secondary | ICD-10-CM

## 2019-12-11 DIAGNOSIS — I2510 Atherosclerotic heart disease of native coronary artery without angina pectoris: Secondary | ICD-10-CM

## 2019-12-11 LAB — BASIC METABOLIC PANEL
BUN/Creatinine Ratio: 13 (ref 10–24)
BUN: 10 mg/dL (ref 8–27)
CO2: 22 mmol/L (ref 20–29)
Calcium: 9.1 mg/dL (ref 8.6–10.2)
Chloride: 100 mmol/L (ref 96–106)
Creatinine, Ser: 0.79 mg/dL (ref 0.76–1.27)
GFR calc Af Amer: 107 mL/min/{1.73_m2} (ref >59)
GFR calc non Af Amer: 92 mL/min/{1.73_m2} (ref >59)
Glucose: 140 mg/dL — ABNORMAL HIGH (ref 65–99)
Potassium: 4.3 mmol/L (ref 3.5–5.2)
Sodium: 136 mmol/L (ref 134–144)

## 2019-12-11 MED ORDER — SPIRONOLACTONE 25 MG PO TABS
25.0000 mg | ORAL_TABLET | Freq: Every day | ORAL | 3 refills | Status: DC
Start: 2019-12-11 — End: 2020-04-02

## 2019-12-11 NOTE — Progress Notes (Signed)
Patient ID: Brady HUMBARGER Sr.                 DOB: [birth year]                      MRN: [id number]   HPI: [name] is a 69 y.o. male referred by Dr. Gwenlyn Found to HTN and lipid clinic. PMH includes CAD s/p stent placement, tobacco abuse, hypertension, COPD, and hyperlipidemia. We discussed HTN and lipid management with him during last OV.   Patient denies dizziness, chest pain, swelling or increase fatigue. Stopped taking amlodipine about 2 weeks ago after developing non-productive cough.  He never took HCTZ due to prior history of low potassium. Patient continues to smoke, but is working on quitting. His quit date is December 23, 2019.   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[place] , gardening during time off work   Home BP readings:  13 morning readings: average 130/71; HR range 76-89bpm 11 evening readings: average 130/70; HR range 78-94bpm  Last reading provided if from 7/25, then patient stopped taking amlodipine and stopped documenting BP readings   Wt Readings from Last 3 Encounters:  12/11/19 201 lb 3.2 oz (91.3 kg)  10/13/19 194 lb 3.2 oz (88.1 kg)  10/07/19 195 lb 12.8 oz (88.8 kg)   BP Readings from Last 3 Encounters:  12/11/19 (!) 148/60  11/04/19 132/72  10/13/19 (!) 142/78   Pulse Readings from Last 3 Encounters:  12/11/19 92  11/04/19 90  10/13/19 83    Past Medical History:  Diagnosis Date  . COPD (chronic obstructive pulmonary disease) ([place])   . Coronary artery disease    a. s/p RCA stenting back in 2005 with a known  chronically occluded circumflex and normal LV function.  . Emphysema lung ([place])   . GERD (gastroesophageal reflux disease)   . Heart attack ([place]) <2005  . History of tobacco abuse   . Hyperlipidemia   . Hypertension   . Lung nodule    a. Suspicious for malignancy, undergoing workup.  . Obesity   . Peripheral arterial disease ([place])    a. history of stent to right common iliac artery 12/15/99 with a peak for balloon-expandable stent. b. s/p intervention on RCIA total occlusion PTA and stent, residual disease on the right for possible staged intervention, notable disease on the left but asymptomatic in 2015. c. 10/2016: s/p PTA and stenting of left common iliac    Current Outpatient Medications on File Prior to Visit  Medication Sig Dispense Refill  . Budeson-Glycopyrrol-Formoterol (BREZTRI AEROSPHERE) 160-9-4.8 MCG/ACT AERO Inhale 2 puffs into the lungs in the morning and at bedtime. 5.9 g 0  . clopidogrel (PLAVIX) 75 MG tablet Take 1 tablet (75 mg total) by mouth daily. 90 tablet 1  . Coenzyme Q10 (COQ10) 100 MG CAPS Take 1 capsule by mouth daily.    . diphenhydrAMINE (BENADRYL) 25 mg capsule Take 25 mg by mouth every 8 (eight) hours as needed for allergies.    . fexofenadine (  ALLEGRA) 180 MG tablet Take 180 mg by mouth daily.     . fluticasone (FLONASE) 50 MCG/ACT nasal spray Place 1 spray into both nostrils daily. 48 g 4  . rosuvastatin (CRESTOR) 20 MG tablet TAKE 1 TABLET(20 MG) BY MOUTH DAILY 90 tablet 0  . triamcinolone (NASACORT ALLERGY 24HR) 55 MCG/ACT AERO nasal inhaler Place 2 sprays into the nose daily as needed (allergies).     No current facility-administered medications on file prior to visit.    Allergies  Allergen Reactions  . Amlodipine Cough  . Aspirin Other (See Comments)    Sneezes, watery nose, wheeze (no polyps)   . Contrast Media [Iodinated Diagnostic Agents]     Need to be pre-medicated, sneezing, watery eyes  . Erythromycin Hives    Childhood allergy All  mycin drugs   . Losartan Potassium Cough  . Penicillins     Childhood allergy Has patient had a PCN reaction causing immediate rash, facial/tongue/throat swelling, SOB or lightheadedness with hypotension: Unknown Has patient had a PCN reaction causing severe rash involving mucus membranes or skin necrosis: Unknown Has patient had a PCN reaction that required hospitalization: No Has patient had a PCN reaction occurring within the last 10 years: No States he has had benadryl when taking this and had no problems recently    . Simvastatin Other (See Comments)    Hip pain  . Sulfa Antibiotics     unknown    Blood pressure (!) 148/60, pulse 92, resp. rate 17, height 5\' 10"  (1.778 m), weight 201 lb 3.2 oz (91.3 kg), SpO2 93 %.  Essential hypertension Blood pressure back up to 223V systolic after patient stopped taking amlodipine. He reports non-productive cough while on amlodipine that resolved immediately after stopping the medication. HCTZ prescribed during lats office visit was never started for fear to low potassium levels.   Will stop amlodipine and HCTZ therapy ,check BMET today to establish baseline, and start spironolactone 25mg  daily. Plan to repeat blood work in 2 weeks and adjust therapy as needed. Four week follow up with HTN clinic scheduled today as well.   Hyperlipidemia Patient is tolerating rosuvastatin therapy. Denies join pain, muscle discomfort or any other problem. Lipid panel done 11/17/2019 showed LDL of 61 mg/dL (60% drop from baseline) and LFTs within normal limits.  Will continue current therapy and re-assess as needed.  Tobacco abuse Patient is working on decreasing amount of cigarettes per day, has nicotine patches at home, and had a quitting day of August, 24.   Johnta Couts Rodriguez-Guzman PharmD, BCPS, [place] [address] [place] [address] 12/12/2019 8:12 AM

## 2019-12-11 NOTE — Patient Instructions (Addendum)
Return for a  follow up appointment in 4 weeks  Go to the lab in [place] your blood pressure at home daily (if able) and keep record of the readings.  Take your BP meds as follows: *STOP taking amlodipine *STOP taking HCZT  *WILL call tomorrow to discuss blood work results and determine if OKAY to take spironolactone 25mg  daily*  Bring all of your meds, your BP cuff and your record of home blood pressures to your next appointment.  Exercise as you're able, try to walk approximately 30 minutes per day.  Keep salt intake to a minimum, especially watch canned and prepared boxed foods.  Eat more fresh fruits and vegetables and fewer canned items.  Avoid eating in fast food restaurants.    HOW TO TAKE YOUR BLOOD PRESSURE: . Rest 5 minutes before taking your blood pressure. .  Don't smoke or drink caffeinated beverages for at least 30 minutes before. . Take your blood pressure before (not after) you eat. . Sit comfortably with your back supported and both feet on the floor (don't cross your legs). . Elevate your arm to heart level on a table or a desk. . Use the proper sized cuff. It should fit smoothly and snugly around your bare upper arm. There should be enough room to slip a fingertip under the cuff. The bottom edge of the cuff should be 1 inch above the crease of the elbow. . Ideally, take 3 measurements at one sitting and record the average.

## 2019-12-12 ENCOUNTER — Encounter: Payer: Self-pay | Admitting: Pharmacist

## 2019-12-12 NOTE — Assessment & Plan Note (Signed)
Blood pressure back up to 156R systolic after patient stopped taking amlodipine. He reports non-productive cough while on amlodipine that resolved immediately after stopping the medication. HCTZ prescribed during lats office visit was never started for fear to low potassium levels.   Will stop amlodipine and HCTZ therapy ,check BMET today to establish baseline, and start spironolactone 25mg  daily. Plan to repeat blood work in 2 weeks and adjust therapy as needed. Four week follow up with HTN clinic scheduled today as well.

## 2019-12-12 NOTE — Assessment & Plan Note (Signed)
Patient is tolerating rosuvastatin therapy. Denies join pain, muscle discomfort or any other problem. Lipid panel done 11/17/2019 showed LDL of 61 mg/dL (60% drop from baseline) and LFTs within normal limits.  Will continue current therapy and re-assess as needed.

## 2019-12-12 NOTE — Assessment & Plan Note (Signed)
Patient is working on decreasing amount of cigarettes per day, has nicotine patches at home, and had a quitting day of August, 24.

## 2019-12-15 DIAGNOSIS — Z1152 Encounter for screening for COVID-19: Secondary | ICD-10-CM | POA: Diagnosis not present

## 2019-12-15 DIAGNOSIS — Z20822 Contact with and (suspected) exposure to covid-19: Secondary | ICD-10-CM | POA: Diagnosis not present

## 2019-12-18 DIAGNOSIS — K5792 Diverticulitis of intestine, part unspecified, without perforation or abscess without bleeding: Secondary | ICD-10-CM | POA: Diagnosis not present

## 2019-12-24 ENCOUNTER — Telehealth: Payer: Self-pay | Admitting: Pharmacist

## 2019-12-24 DIAGNOSIS — I10 Essential (primary) hypertension: Secondary | ICD-10-CM

## 2019-12-24 DIAGNOSIS — Z79899 Other long term (current) drug therapy: Secondary | ICD-10-CM

## 2019-12-24 NOTE — Telephone Encounter (Signed)
LMOM, patient is due to repeat blood work after 2 weeks on spironolactone.  Order in epic

## 2019-12-26 DIAGNOSIS — Z79899 Other long term (current) drug therapy: Secondary | ICD-10-CM | POA: Diagnosis not present

## 2019-12-26 DIAGNOSIS — I10 Essential (primary) hypertension: Secondary | ICD-10-CM | POA: Diagnosis not present

## 2019-12-27 LAB — BASIC METABOLIC PANEL
BUN/Creatinine Ratio: 16 (ref 10–24)
BUN: 14 mg/dL (ref 8–27)
CO2: 25 mmol/L (ref 20–29)
Calcium: 8.9 mg/dL (ref 8.6–10.2)
Chloride: 103 mmol/L (ref 96–106)
Creatinine, Ser: 0.88 mg/dL (ref 0.76–1.27)
GFR calc Af Amer: 101 mL/min/{1.73_m2} (ref >59)
GFR calc non Af Amer: 88 mL/min/{1.73_m2} (ref >59)
Glucose: 143 mg/dL — ABNORMAL HIGH (ref 65–99)
Potassium: 4.6 mmol/L (ref 3.5–5.2)
Sodium: 141 mmol/L (ref 134–144)

## 2020-01-01 DIAGNOSIS — K5792 Diverticulitis of intestine, part unspecified, without perforation or abscess without bleeding: Secondary | ICD-10-CM | POA: Diagnosis not present

## 2020-01-08 ENCOUNTER — Other Ambulatory Visit: Payer: Self-pay

## 2020-01-08 ENCOUNTER — Ambulatory Visit (INDEPENDENT_AMBULATORY_CARE_PROVIDER_SITE_OTHER): Payer: Medicare Other | Admitting: Pharmacist

## 2020-01-08 VITALS — BP 148/82 | HR 94 | Resp 16 | Ht 70.0 in | Wt 198.2 lb

## 2020-01-08 DIAGNOSIS — I10 Essential (primary) hypertension: Secondary | ICD-10-CM

## 2020-01-08 NOTE — Patient Instructions (Addendum)
Return for a  follow up appointment in  8 weeks by phone, then as needed  Check your blood pressure at home daily (if able) and keep record of the readings.  Take your BP meds as follows: *NO MEDICATION CHANGE*  Bring all of your meds, your BP cuff and your record of home blood pressures to your next appointment.  Exercise as you're able, try to walk approximately 30 minutes per day.  Keep salt intake to a minimum, especially watch canned and prepared boxed foods.  Eat more fresh fruits and vegetables and fewer canned items.  Avoid eating in fast food restaurants.    HOW TO TAKE YOUR BLOOD PRESSURE: . Rest 5 minutes before taking your blood pressure. .  Don't smoke or drink caffeinated beverages for at least 30 minutes before. . Take your blood pressure before (not after) you eat. . Sit comfortably with your back supported and both feet on the floor (don't cross your legs). . Elevate your arm to heart level on a table or a desk. . Use the proper sized cuff. It should fit smoothly and snugly around your bare upper arm. There should be enough room to slip a fingertip under the cuff. The bottom edge of the cuff should be 1 inch above the crease of the elbow. . Ideally, take 3 measurements at one sitting and record the average.

## 2020-01-08 NOTE — Progress Notes (Signed)
Patient ID: Brady MAINO Sr.                 DOB: 11-Feb-1951                      MRN: [id number]   HPI: Brady MAHAR Sr. is a 69 y.o. male referred by Dr. Gwenlyn Found to HTN and lipid clinic. PMH includes CAD s/p stent placement, tobacco abuse, hypertension, COPD, and hyperlipidemia.   Patient denies dizziness, chest pain, swelling or increase fatigue. Stopped taking amlodipine and never took HCTZ due to prior history of low potassium. Noted patient taking ciprofloxacin and metronidazole for diverticulitis. Denies pain at this time.  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[place], gardening during time off work   Home BP readings:  15 morning readings: average 121/70  ; HR range 73-92 bpm 15 evening readings: average 125/67 ; HR range 58-88 bpm  *Home BP cuff not calibrated but showing SBP 10-15 mmHg decease from previous average*  Wt Readings from Last 3 Encounters:  01/08/20 198 lb 3.2 oz (89.9 kg)  12/11/19 201 lb 3.2 oz (91.3 kg)  10/13/19 194 lb 3.2 oz (88.1 kg)   BP Readings from Last 3 Encounters:  01/08/20 (!) 148/82  12/11/19 (!) 148/60  11/04/19 132/72   Pulse Readings from Last 3 Encounters:  01/08/20 94  12/11/19 92  11/04/19 90    Past Medical History:  Diagnosis Date   COPD (chronic obstructive pulmonary disease) ([place])    Coronary artery disease    a. s/p RCA stenting back in 2005 with a known chronically occluded circumflex and normal LV function.   Emphysema lung ([facility])    GERD (gastroesophageal reflux disease)    Heart attack ([place]) <2005   History of tobacco abuse    Hyperlipidemia    Hypertension    Lung nodule    a. Suspicious for malignancy, undergoing  workup.   Obesity    Peripheral arterial disease ([place])    a. history of stent to right common iliac artery 12/15/99 with a peak for balloon-expandable stent. b. s/p intervention on RCIA total occlusion PTA and stent, residual disease on the right for possible staged intervention, notable disease on the left but asymptomatic in 2015. c. 10/2016: s/p PTA and stenting of left common iliac    Current Outpatient Medications on File Prior to Visit  Medication Sig Dispense Refill   Budeson-Glycopyrrol-Formoterol (BREZTRI AEROSPHERE) 160-9-4.8 MCG/ACT AERO Inhale 2 puffs into the lungs in the morning and at bedtime. 5.9 g 0   clopidogrel (PLAVIX) 75 MG tablet Take 1 tablet (75 mg total) by mouth daily. 90 tablet 1   Coenzyme Q10 (COQ10) 100 MG CAPS Take 1 capsule by mouth daily.     diphenhydrAMINE (BENADRYL) 25 mg capsule Take 25 mg by mouth every 8 (eight) hours as needed for allergies.     fexofenadine (ALLEGRA) 180 MG tablet Take 180 mg by mouth daily.      fluticasone (FLONASE) 50 MCG/ACT nasal spray Place 1 spray into both nostrils daily. 48 g 4   rosuvastatin (CRESTOR) 20 MG tablet TAKE 1 TABLET(20 MG) BY MOUTH DAILY 90 tablet 0  spironolactone (ALDACTONE) 25 MG tablet Take 1 tablet (25 mg total) by mouth daily. 30 tablet 3   triamcinolone (NASACORT ALLERGY 24HR) 55 MCG/ACT AERO nasal inhaler Place 2 sprays into the nose daily as needed (allergies).     No current facility-administered medications on file prior to visit.    Allergies  Allergen Reactions   Amlodipine Cough   Aspirin Other (See Comments)    Sneezes, watery nose, wheeze (no polyps)    Contrast Media [Iodinated Diagnostic Agents]     Need to be pre-medicated, sneezing, watery eyes   Erythromycin Hives    Childhood allergy All mycin drugs    Losartan Potassium Cough   Penicillins     Childhood allergy Has patient had a PCN reaction causing immediate rash, facial/tongue/throat swelling, SOB or  lightheadedness with hypotension: Unknown Has patient had a PCN reaction causing severe rash involving mucus membranes or skin necrosis: Unknown Has patient had a PCN reaction that required hospitalization: No Has patient had a PCN reaction occurring within the last 10 years: No States he has had benadryl when taking this and had no problems recently     Simvastatin Other (See Comments)    Hip pain   Sulfa Antibiotics     unknown    Blood pressure (!) 148/82, pulse 94, resp. rate 16, height 5\' 10"  (1.778 m), weight 198 lb 3.2 oz (89.9 kg), SpO2 91 %.  Essential hypertension Blood pressure remains above goal during OV, but patient smoked a cigarettes only few minutes before visit. Also noted he is taking ciprofloxacin and metronidazole for diverticulitis. Patient had to stop amlodipine and HCTZ due to ADR, but is tolerating spironolactone. Repeat BMET shows stable renal function and electrolytes. Home BP average improved from 153P systolic to average 943/27.  Will continue current medication without changes and re-assess in 8 weeks. Plan to repeat CMEt and fasting lipid panel in 12 weeks.   Fusae Florio Rodriguez-Guzman PharmD, BCPS, [place] [id number] 01/15/2020 4:49 PM

## 2020-01-15 ENCOUNTER — Encounter: Payer: Self-pay | Admitting: Pharmacist

## 2020-01-15 NOTE — Assessment & Plan Note (Addendum)
Blood pressure remains above goal during OV, but patient smoked a cigarettes only few minutes before visit. Also noted he is taking ciprofloxacin and metronidazole for diverticulitis. Patient had to stop amlodipine and HCTZ due to ADR, but is tolerating spironolactone. Repeat BMET shows stable renal function and electrolytes. Home BP average improved from 174W systolic to average 992/78.  Will continue current medication without changes and re-assess in 8 weeks. Plan to repeat CMEt and fasting lipid panel in 12 weeks.

## 2020-02-01 DIAGNOSIS — Z23 Encounter for immunization: Secondary | ICD-10-CM | POA: Diagnosis not present

## 2020-02-12 ENCOUNTER — Ambulatory Visit: Payer: Medicare Other

## 2020-02-12 ENCOUNTER — Other Ambulatory Visit: Payer: Self-pay

## 2020-02-12 ENCOUNTER — Ambulatory Visit (INDEPENDENT_AMBULATORY_CARE_PROVIDER_SITE_OTHER): Payer: Medicare Other | Admitting: Internal Medicine

## 2020-02-12 ENCOUNTER — Encounter: Payer: Self-pay | Admitting: Internal Medicine

## 2020-02-12 VITALS — BP 136/78 | HR 72 | Temp 97.3°F | Ht 70.0 in | Wt 192.0 lb

## 2020-02-12 DIAGNOSIS — R911 Solitary pulmonary nodule: Secondary | ICD-10-CM | POA: Diagnosis not present

## 2020-02-12 DIAGNOSIS — J449 Chronic obstructive pulmonary disease, unspecified: Secondary | ICD-10-CM

## 2020-02-12 DIAGNOSIS — I2510 Atherosclerotic heart disease of native coronary artery without angina pectoris: Secondary | ICD-10-CM | POA: Diagnosis not present

## 2020-02-12 DIAGNOSIS — Z23 Encounter for immunization: Secondary | ICD-10-CM

## 2020-02-12 DIAGNOSIS — Z720 Tobacco use: Secondary | ICD-10-CM

## 2020-02-12 MED ORDER — BREZTRI AEROSPHERE 160-9-4.8 MCG/ACT IN AERO: INHALATION_SPRAY | RESPIRATORY_TRACT | 12 refills | Status: DC

## 2020-02-12 MED ORDER — ALBUTEROL SULFATE HFA 108 (90 BASE) MCG/ACT IN AERS
2.0000 | INHALATION_SPRAY | Freq: Four times a day (QID) | RESPIRATORY_TRACT | 12 refills | Status: DC | PRN
Start: 2020-02-12 — End: 2021-03-02

## 2020-02-12 NOTE — Progress Notes (Signed)
HPI male one pack per day Smoker followed for lung nodules, tobacco abuse, COPD, complicated by CAD, PAD/claudication, HBP, Aspirin Allergy CT chest  03/06/14 2 incidental pulmonary nodules noted in the lungs bilaterally PFT 12/02/14-severe obstructive airways disease with insignificant response to bronchodilator, air trapping, moderately reduced diffusion. FEV1/FVC 0.59, RV 139%, TLC 93%, DLCO 62%.  -----------------------------------------------------------------------------------------------  10/04/19- 69 year old male one pack per day smoker followed for lung nodules, tobacco abuse, COPD, complicated by CAD, PAD/claudication, HBP, aspirin allergy Continues to smoke. Notes DOE in shower and as he lies down, without recent change. A little dry cough. Had a diarrheal illness, now resolved, which was treated at [facility]. CXR report commented on pneumonitis vs fibrosis, and his wife asked if a pneumonia could have caused his GI distress.  Flonase helps nasal congestion- needs refill.  Not currently on any inhalers for chest. Had 3 Phizer Covax.  Followed by cardiology for significant peripheral arterial disease.  CXR 10/24/16 IMPRESSION: 1. Peribronchial thickening consistent with bronchitis. 2. No active process.  02/12/20- 69 year old male one pack per day Smoker followed for lung nodules, tobacco abuse, COPD, complicated by CAD, PAD/claudication, HBP, aspirin allergy Breztri, Nasacort,  Covid vax- 2 Phizer Flu vax- -----4 mth f/u - Increased sob since last ov - Breztri seemed to help but pt didnt order rx since samples run out - Pt asking about rx for rescue inhaler - Pt using 1/2 tab of Primatene as needed now We sent doxycycline after CT in June showed probable pneumonia R lung. Still smoking. Paces himself mowing lawn without any acute change.  Had antibiotic for diverticulitis 2 months ago Discused CT CT chest High Res- 10/22/19- IMPRESSION: 1. New patchy clustered nodular  peribronchovascular foci of consolidation in the medial basilar right lower lobe, most likely a mild bronchopneumonia. Recommend attention on post treatment follow-up chest CT in 3 months. 2. Previously described scattered small pulmonary nodules in both lungs are all stable since 01/07/2016 chest CT and considered benign. 3. Severe centrilobular and paraseptal emphysema with diffuse bronchial wall thickening, suggesting COPD. 4. Three-vessel coronary atherosclerosis. 5. Aortic Atherosclerosis (ICD10-I70.0) and Emphysema (ICD10-J43.9).   ROS-see HPI     + = positve Constitutional:   No-   weight loss, night sweats, fevers, chills, fatigue, lassitude. HEENT:   No-  headaches, difficulty swallowing, tooth/dental problems, sore throat,       No-  sneezing, itching, ear ache, nasal congestion, +post nasal drip,  CV:  No-   chest pain, orthopnea, PND, swelling in lower extremities, anasarca,  dizziness, palpitations Resp: +   shortness of breath with exertion or at rest.              No-   productive cough,  + non-productive cough,  No- coughing up of blood.              No-   change in color of mucus.  No- wheezing.   Skin: No-   rash or lesions. GI:  No-   heartburn, indigestion, abdominal pain, nausea, vomiting GU:  MS:  No-   joint pain or swelling.   Neuro-     nothing unusual Psych:  No- change in mood or affect. No depression or anxiety.  No memory loss.  OBJ- Physical Exam General- Alert, Oriented, Affect-appropriate, Distress- none acute,  Skin- rash-none, lesions- none, excoriation- none,  Lymphadenopathy- none Head- atraumatic            Eyes- Gross vision intact, PERRLA, conjunctivae and secretions clear  Ears- Hearing okay,             Nose- +turbinate edema, no-Septal dev, mucus, polyps, erosion, perforation             Throat- Mallampati III , mucosa clear , drainage- none, tonsils- atrophic, own teeth Neck- flexible , trachea midline, no stridor , thyroid  nl, carotid no bruit Chest - symmetrical excursion , unlabored           Heart/CV- RRR , no murmur , no gallop  , no rub, nl s1 s2                           - JVD- none , edema- none, stasis changes- none, varices- none           Lung-  unlabored, ckear/ diminished, wheeze- none, cough- none , dullness-none, rub- none           Chest wall-  Abd- Br/ Gen/ Rectal- Not done, not indicated Extrem- cyanosis- none, clubbing, none, atrophy- none, strength- nl Neuro- grossly intact to observation

## 2020-02-12 NOTE — Patient Instructions (Addendum)
Order- Flu vax senior  Order- Pneumovax 23  Order- CXR   Dx COPD mixed type  Scripts sent for albuterol rescue inhaler and for Breztri maintenance inhaler  Please call if we can help  Please set your mind to quitting smoking. You don't want your wife to wake up without you !!

## 2020-02-17 NOTE — Assessment & Plan Note (Signed)
Continuing counseling with each visit and trying to get him to make a goal of smoking cessation. Support when he is willing.

## 2020-02-17 NOTE — Assessment & Plan Note (Signed)
Stable, but with significant COPD. Medications discussed Plan- update CXR, Flu vax, Scripts for [employer] and albuterol rescue inhaler

## 2020-02-26 ENCOUNTER — Ambulatory Visit (INDEPENDENT_AMBULATORY_CARE_PROVIDER_SITE_OTHER): Payer: Medicare Other

## 2020-02-26 DIAGNOSIS — J439 Emphysema, unspecified: Secondary | ICD-10-CM | POA: Diagnosis not present

## 2020-02-26 DIAGNOSIS — R911 Solitary pulmonary nodule: Secondary | ICD-10-CM

## 2020-02-26 DIAGNOSIS — J449 Chronic obstructive pulmonary disease, unspecified: Secondary | ICD-10-CM | POA: Diagnosis not present

## 2020-03-02 ENCOUNTER — Other Ambulatory Visit: Payer: Self-pay | Admitting: Internal Medicine

## 2020-03-02 ENCOUNTER — Telehealth: Payer: Self-pay | Admitting: Internal Medicine

## 2020-03-02 DIAGNOSIS — R911 Solitary pulmonary nodule: Secondary | ICD-10-CM

## 2020-03-02 NOTE — Telephone Encounter (Signed)
CXR- The dense area of small nodules seen on the CT scan in June doesn't show up well and the radiologist does feel we should get a follow-up CT scan  Please order CT chest , no contrast dx Nodules right lung  Pt notified of results and I have ordered the CT

## 2020-03-11 ENCOUNTER — Other Ambulatory Visit: Payer: Self-pay

## 2020-03-11 ENCOUNTER — Ambulatory Visit (HOSPITAL_COMMUNITY)
Admission: RE | Admit: 2020-03-11 | Discharge: 2020-03-11 | Disposition: A | Discharge status: Home / Self Care | Payer: Medicare Other | Source: Ambulatory Visit | Attending: Internal Medicine | Admitting: Internal Medicine

## 2020-03-11 DIAGNOSIS — J432 Centrilobular emphysema: Secondary | ICD-10-CM | POA: Diagnosis not present

## 2020-03-11 DIAGNOSIS — R911 Solitary pulmonary nodule: Secondary | ICD-10-CM

## 2020-03-11 DIAGNOSIS — M5134 Other intervertebral disc degeneration, thoracic region: Secondary | ICD-10-CM | POA: Diagnosis not present

## 2020-03-11 DIAGNOSIS — I2510 Atherosclerotic heart disease of native coronary artery without angina pectoris: Secondary | ICD-10-CM | POA: Diagnosis not present

## 2020-03-11 DIAGNOSIS — I700 Atherosclerosis of aorta: Secondary | ICD-10-CM | POA: Diagnosis not present

## 2020-03-12 ENCOUNTER — Telehealth: Payer: Self-pay | Admitting: Internal Medicine

## 2020-03-12 NOTE — Telephone Encounter (Signed)
ATC patient unable to reach no voicemail to leave message to call back  Patient calling in regards to CT results

## 2020-03-12 NOTE — Progress Notes (Signed)
Spoke with pt and notified of results per Dr. Young Pt verbalized understanding and denied any questions. 

## 2020-03-12 NOTE — Telephone Encounter (Signed)
Pt returning call regarding CT scan results - [phone]

## 2020-03-12 NOTE — Telephone Encounter (Signed)
Baird Lyons D, MD sent to [facility] Triage Pool CT chest- the pneumonia has cleared. There is emphysema and coronary artery disease, but no active process otherwise   Spoke with pt and notified of results per Dr. Annamaria Boots. Pt verbalized understanding and denied any questions.

## 2020-03-18 ENCOUNTER — Ambulatory Visit: Payer: Medicare Other

## 2020-04-02 ENCOUNTER — Other Ambulatory Visit: Payer: Self-pay | Admitting: Cardiovascular Disease

## 2020-05-06 MED ORDER — DOXYCYCLINE HYCLATE 100 MG PO TABS
100.0000 mg | ORAL_TABLET | Freq: Two times a day (BID) | ORAL | 0 refills | Status: DC
Start: 2020-05-06 — End: 2020-08-06

## 2020-05-06 NOTE — Telephone Encounter (Signed)
Please advise on patient mychart message  having problems with tightness and cough in chest mucus is yellow green in color do you think i should be on antibiotic temp. has been normal for past 2 days. i have not been running a fever prior that i'm aware of. cough started 3-4 days ago . tightness in chest and mucus past 2 days some wheezing.

## 2020-05-06 NOTE — Telephone Encounter (Signed)
Doxycycline sent to [facility]

## 2020-05-06 NOTE — Telephone Encounter (Signed)
Message from patient when offered Doxy  yes please order [facility] [address]

## 2020-05-25 ENCOUNTER — Other Ambulatory Visit: Payer: Self-pay

## 2020-05-25 MED ORDER — BREZTRI AEROSPHERE 160-9-4.8 MCG/ACT IN AERO: INHALATION_SPRAY | RESPIRATORY_TRACT | 12 refills | Status: DC

## 2020-08-05 NOTE — Progress Notes (Signed)
HPI male one pack per day Smoker followed for lung nodules, tobacco abuse, COPD, complicated by CAD, PAD/claudication, HBP, Aspirin Allergy CT chest  03/06/14 2 incidental pulmonary nodules noted in the lungs bilaterally PFT 12/02/14-severe obstructive airways disease with insignificant response to bronchodilator, air trapping, moderately reduced diffusion. FEV1/FVC 0.59, RV 139%, TLC 93%, DLCO 62%.  -----------------------------------------------------------------------------------------------    02/12/20- 70 year old male one pack per day Smoker followed for lung nodules, tobacco abuse, COPD, complicated by CAD, PAD/claudication, HBP, aspirin allergy Breztri, Nasacort,  Covid vax- 2 Phizer Flu vax- -----4 mth f/u - Increased sob since last ov - Breztri seemed to help but pt didnt order rx since samples run out - Pt asking about rx for rescue inhaler - Pt using 1/2 tab of Primatene as needed now We sent doxycycline after CT in June showed probable pneumonia R lung. Still smoking. Paces himself mowing lawn without any acute change.  Had antibiotic for diverticulitis 2 months ago Discused CT CT chest High Res- 10/22/19- IMPRESSION: 1. New patchy clustered nodular peribronchovascular foci of consolidation in the medial basilar right lower lobe, most likely a mild bronchopneumonia. Recommend attention on post treatment follow-up chest CT in 3 months. 2. Previously described scattered small pulmonary nodules in both lungs are all stable since 01/07/2016 chest CT and considered benign. 3. Severe centrilobular and paraseptal emphysema with diffuse bronchial wall thickening, suggesting COPD. 4. Three-vessel coronary atherosclerosis. 5. Aortic Atherosclerosis (ICD10-I70.0) and Emphysema (ICD10-J43.9).  08/06/20- 70 year old male one pack per day Smoker followed for Lung Nodules, tobacco abuse, COPD, complicated by CAD, PAD/claudication, HBP, aspirin allergy -Breztri, Nasacort, Ventolin hfa,   Covid  vax-3 Phizer Flu vax- had Still smoking and not motivated to quit- discussed again. Wants to retry Trelegy compared with Breztri. Paces himself for yard work but denies chest pain or acute events.  CT reviewed. CT chest 03/11/20-  IMPRESSION: 1. Interval resolution of previous right lower lobe airspace consolidation compatible with a resolved inflammatory/infectious process. 2. Stable small pulmonary nodules. These have remained stable since 01/07/2016 and are considered benign. 3. Severe changes of centrilobular and paraseptal emphysema. 4. Coronary artery calcifications. Aortic Atherosclerosis (ICD10-I70.0) and Emphysema (ICD10-J43.9).     ROS-see HPI     + = positve Constitutional:   No-   weight loss, night sweats, fevers, chills, fatigue, lassitude. HEENT:   No-  headaches, difficulty swallowing, tooth/dental problems, sore throat,       No-  sneezing, itching, ear ache, nasal congestion, +post nasal drip,  CV:  No-   chest pain, orthopnea, PND, swelling in lower extremities, anasarca,  dizziness, palpitations Resp: +   shortness of breath with exertion or at rest.              No-   productive cough,  + non-productive cough,  No- coughing up of blood.              No-   change in color of mucus.  No- wheezing.   Skin: No-   rash or lesions. GI:  No-   heartburn, indigestion, abdominal pain, nausea, vomiting GU:  MS:  No-   joint pain or swelling.   Neuro-     nothing unusual Psych:  No- change in mood or affect. No depression or anxiety.  No memory loss.  OBJ- Physical Exam General- Alert, Oriented, Affect-appropriate, Distress- none acute,  Skin- rash-none, lesions- none, excoriation- none,  Lymphadenopathy- none Head- atraumatic            Eyes- Gross vision  intact, PERRLA, conjunctivae and secretions clear            Ears- Hearing okay,             Nose- +turbinate edema, no-Septal dev, mucus, polyps, erosion, perforation             Throat- Mallampati  III , mucosa clear , drainage- none, tonsils- atrophic, own teeth Neck- flexible , trachea midline, no stridor , thyroid nl, carotid no bruit Chest - symmetrical excursion , unlabored           Heart/CV- RRR , no murmur , no gallop  , no rub, nl s1 s2                           - JVD- none , edema- none, stasis changes- none, varices- none           Lung-  unlabored, ckear/ diminished, wheeze- none, cough- none , dullness-none, rub- none           Chest wall-  Abd- Br/ Gen/ Rectal- Not done, not indicated Extrem- cyanosis- none, clubbing, none, atrophy- none, strength- nl Neuro- grossly intact to observation

## 2020-08-06 ENCOUNTER — Encounter: Payer: Self-pay | Admitting: Internal Medicine

## 2020-08-06 ENCOUNTER — Ambulatory Visit (INDEPENDENT_AMBULATORY_CARE_PROVIDER_SITE_OTHER): Payer: Medicare Other | Admitting: Internal Medicine

## 2020-08-06 ENCOUNTER — Other Ambulatory Visit: Payer: Self-pay

## 2020-08-06 VITALS — BP 122/80 | HR 94 | Temp 98.0°F | Ht 70.0 in | Wt 194.8 lb

## 2020-08-06 DIAGNOSIS — J449 Chronic obstructive pulmonary disease, unspecified: Secondary | ICD-10-CM

## 2020-08-06 DIAGNOSIS — Z720 Tobacco use: Secondary | ICD-10-CM

## 2020-08-06 DIAGNOSIS — R911 Solitary pulmonary nodule: Secondary | ICD-10-CM | POA: Diagnosis not present

## 2020-08-06 MED ORDER — TRELEGY ELLIPTA 100-62.5-25 MCG/INH IN AEPB: 1.0000 | INHALATION_SPRAY | Freq: Every day | RESPIRATORY_TRACT | 0 refills | Status: DC

## 2020-08-06 MED ORDER — IPRATROPIUM-ALBUTEROL 0.5-2.5 (3) MG/3ML IN SOLN: 3.0000 mL | Freq: Four times a day (QID) | RESPIRATORY_TRACT | 99 refills | Status: AC | PRN

## 2020-08-06 NOTE — Patient Instructions (Signed)
Order- Sample x 2 Trelegy 100   Inhale 1 puff then rinse mouth,once daily. Try this instead of  Breztri for comparison  Order- new DME- please order new compressor nebulizer and Duoneb neb solution, 75 ml, refill prn, 1 neb every 6 hours if needed  Please keep working to quit smoking. Your heart and lungs need you to stop.

## 2020-08-12 ENCOUNTER — Ambulatory Visit: Payer: Medicare Other | Admitting: Internal Medicine

## 2020-08-26 ENCOUNTER — Other Ambulatory Visit: Payer: Self-pay | Admitting: Cardiovascular Disease

## 2020-08-26 ENCOUNTER — Telehealth: Payer: Self-pay | Admitting: *Deleted

## 2020-08-26 NOTE — Telephone Encounter (Signed)
A message was left, re: his follow up visit. 

## 2020-08-27 MED ORDER — TRELEGY ELLIPTA 100-62.5-25 MCG/INH IN AEPB: 1.0000 | INHALATION_SPRAY | Freq: Every day | RESPIRATORY_TRACT | 12 refills | Status: DC

## 2020-08-27 NOTE — Telephone Encounter (Signed)
Pt sent MyChart message saying that he prefers Trelegy to [place] and would like this sent in for him.   Copied from 4/8 AVS:  Instructions    Return in about 6 months (around 02/05/2021). Order- Sample x 2 Trelegy 100   Inhale 1 puff then rinse mouth,once daily. Try this instead of  Breztri for comparison       Dr. Annamaria Boots ok to send in Trelegy to replace Breztri?

## 2020-09-21 ENCOUNTER — Encounter: Payer: Self-pay | Admitting: Internal Medicine

## 2020-09-21 NOTE — Assessment & Plan Note (Signed)
Intend ongoing surveillance. Current nodules understood likely benign.

## 2020-09-21 NOTE — Assessment & Plan Note (Signed)
Never too late to quit. Importance and availability of support again emphasized

## 2020-09-21 NOTE — Assessment & Plan Note (Signed)
Emphysema predominant. Plan- retry Trelegy samples. Add nebulizer with Duoneb.

## 2020-10-20 ENCOUNTER — Ambulatory Visit (HOSPITAL_BASED_OUTPATIENT_CLINIC_OR_DEPARTMENT_OTHER)
Admission: RE | Admit: 2020-10-20 | Discharge: 2020-10-20 | Disposition: A | Discharge status: Home / Self Care | Payer: Medicare Other | Source: Ambulatory Visit | Attending: Cardiovascular Disease | Admitting: Cardiovascular Disease

## 2020-10-20 ENCOUNTER — Other Ambulatory Visit (HOSPITAL_COMMUNITY): Payer: Self-pay | Admitting: Cardiovascular Disease

## 2020-10-20 ENCOUNTER — Other Ambulatory Visit: Payer: Self-pay

## 2020-10-20 ENCOUNTER — Ambulatory Visit (HOSPITAL_COMMUNITY)
Admission: RE | Admit: 2020-10-20 | Discharge: 2020-10-20 | Disposition: A | Discharge status: Home / Self Care | Payer: Medicare Other | Source: Ambulatory Visit | Attending: Cardiovascular Disease | Admitting: Cardiovascular Disease

## 2020-10-20 DIAGNOSIS — Z95828 Presence of other vascular implants and grafts: Secondary | ICD-10-CM | POA: Insufficient documentation

## 2020-10-20 DIAGNOSIS — I739 Peripheral vascular disease, unspecified: Secondary | ICD-10-CM | POA: Insufficient documentation

## 2020-10-31 ENCOUNTER — Encounter (HOSPITAL_COMMUNITY): Payer: Self-pay | Admitting: Emergency Medicine

## 2020-10-31 ENCOUNTER — Other Ambulatory Visit: Payer: Self-pay

## 2020-10-31 ENCOUNTER — Inpatient Hospital Stay (HOSPITAL_COMMUNITY)
Admission: EM | Admit: 2020-10-31 | Discharge: 2020-11-09 | DRG: 190 | Disposition: A | Discharge status: Home / Self Care | Payer: Medicare Other | Attending: Internal Medicine | Admitting: Internal Medicine

## 2020-10-31 ENCOUNTER — Emergency Department (HOSPITAL_COMMUNITY): Payer: Medicare Other

## 2020-10-31 DIAGNOSIS — Z8249 Family history of ischemic heart disease and other diseases of the circulatory system: Secondary | ICD-10-CM

## 2020-10-31 DIAGNOSIS — K56609 Unspecified intestinal obstruction, unspecified as to partial versus complete obstruction: Secondary | ICD-10-CM

## 2020-10-31 DIAGNOSIS — K5730 Diverticulosis of large intestine without perforation or abscess without bleeding: Secondary | ICD-10-CM | POA: Diagnosis not present

## 2020-10-31 DIAGNOSIS — J479 Bronchiectasis, uncomplicated: Secondary | ICD-10-CM | POA: Diagnosis not present

## 2020-10-31 DIAGNOSIS — R079 Chest pain, unspecified: Secondary | ICD-10-CM | POA: Diagnosis not present

## 2020-10-31 DIAGNOSIS — K5651 Intestinal adhesions [bands], with partial obstruction: Secondary | ICD-10-CM | POA: Diagnosis not present

## 2020-10-31 DIAGNOSIS — K219 Gastro-esophageal reflux disease without esophagitis: Secondary | ICD-10-CM | POA: Diagnosis present

## 2020-10-31 DIAGNOSIS — Z881 Allergy status to other antibiotic agents status: Secondary | ICD-10-CM

## 2020-10-31 DIAGNOSIS — Z955 Presence of coronary angioplasty implant and graft: Secondary | ICD-10-CM

## 2020-10-31 DIAGNOSIS — D7589 Other specified diseases of blood and blood-forming organs: Secondary | ICD-10-CM | POA: Diagnosis not present

## 2020-10-31 DIAGNOSIS — I25119 Atherosclerotic heart disease of native coronary artery with unspecified angina pectoris: Secondary | ICD-10-CM | POA: Diagnosis not present

## 2020-10-31 DIAGNOSIS — I248 Other forms of acute ischemic heart disease: Secondary | ICD-10-CM | POA: Diagnosis not present

## 2020-10-31 DIAGNOSIS — R109 Unspecified abdominal pain: Secondary | ICD-10-CM | POA: Diagnosis not present

## 2020-10-31 DIAGNOSIS — I739 Peripheral vascular disease, unspecified: Secondary | ICD-10-CM | POA: Diagnosis not present

## 2020-10-31 DIAGNOSIS — I471 Supraventricular tachycardia: Secondary | ICD-10-CM | POA: Diagnosis not present

## 2020-10-31 DIAGNOSIS — J441 Chronic obstructive pulmonary disease with (acute) exacerbation: Principal | ICD-10-CM

## 2020-10-31 DIAGNOSIS — F17210 Nicotine dependence, cigarettes, uncomplicated: Secondary | ICD-10-CM | POA: Diagnosis present

## 2020-10-31 DIAGNOSIS — I10 Essential (primary) hypertension: Secondary | ICD-10-CM | POA: Diagnosis present

## 2020-10-31 DIAGNOSIS — R0902 Hypoxemia: Secondary | ICD-10-CM | POA: Diagnosis not present

## 2020-10-31 DIAGNOSIS — K5669 Other partial intestinal obstruction: Secondary | ICD-10-CM | POA: Diagnosis not present

## 2020-10-31 DIAGNOSIS — J439 Emphysema, unspecified: Secondary | ICD-10-CM | POA: Diagnosis present

## 2020-10-31 DIAGNOSIS — Z20822 Contact with and (suspected) exposure to covid-19: Secondary | ICD-10-CM | POA: Diagnosis present

## 2020-10-31 DIAGNOSIS — Z825 Family history of asthma and other chronic lower respiratory diseases: Secondary | ICD-10-CM | POA: Diagnosis not present

## 2020-10-31 DIAGNOSIS — Z79899 Other long term (current) drug therapy: Secondary | ICD-10-CM

## 2020-10-31 DIAGNOSIS — I255 Ischemic cardiomyopathy: Secondary | ICD-10-CM | POA: Diagnosis present

## 2020-10-31 DIAGNOSIS — I252 Old myocardial infarction: Secondary | ICD-10-CM | POA: Diagnosis not present

## 2020-10-31 DIAGNOSIS — Z7902 Long term (current) use of antithrombotics/antiplatelets: Secondary | ICD-10-CM

## 2020-10-31 DIAGNOSIS — J9602 Acute respiratory failure with hypercapnia: Secondary | ICD-10-CM | POA: Diagnosis present

## 2020-10-31 DIAGNOSIS — Z801 Family history of malignant neoplasm of trachea, bronchus and lung: Secondary | ICD-10-CM | POA: Diagnosis not present

## 2020-10-31 DIAGNOSIS — J9601 Acute respiratory failure with hypoxia: Secondary | ICD-10-CM | POA: Diagnosis not present

## 2020-10-31 DIAGNOSIS — R112 Nausea with vomiting, unspecified: Secondary | ICD-10-CM | POA: Diagnosis not present

## 2020-10-31 DIAGNOSIS — Z720 Tobacco use: Secondary | ICD-10-CM | POA: Diagnosis not present

## 2020-10-31 DIAGNOSIS — Z7951 Long term (current) use of inhaled steroids: Secondary | ICD-10-CM

## 2020-10-31 DIAGNOSIS — I2510 Atherosclerotic heart disease of native coronary artery without angina pectoris: Secondary | ICD-10-CM | POA: Diagnosis not present

## 2020-10-31 DIAGNOSIS — R1031 Right lower quadrant pain: Secondary | ICD-10-CM | POA: Diagnosis not present

## 2020-10-31 DIAGNOSIS — E785 Hyperlipidemia, unspecified: Secondary | ICD-10-CM | POA: Diagnosis not present

## 2020-10-31 DIAGNOSIS — F32A Depression, unspecified: Secondary | ICD-10-CM | POA: Diagnosis present

## 2020-10-31 DIAGNOSIS — K6389 Other specified diseases of intestine: Secondary | ICD-10-CM | POA: Diagnosis not present

## 2020-10-31 DIAGNOSIS — I480 Paroxysmal atrial fibrillation: Secondary | ICD-10-CM | POA: Diagnosis not present

## 2020-10-31 DIAGNOSIS — N281 Cyst of kidney, acquired: Secondary | ICD-10-CM | POA: Diagnosis not present

## 2020-10-31 DIAGNOSIS — I213 ST elevation (STEMI) myocardial infarction of unspecified site: Secondary | ICD-10-CM | POA: Diagnosis not present

## 2020-10-31 DIAGNOSIS — R0789 Other chest pain: Secondary | ICD-10-CM | POA: Diagnosis not present

## 2020-10-31 DIAGNOSIS — I493 Ventricular premature depolarization: Secondary | ICD-10-CM | POA: Diagnosis not present

## 2020-10-31 DIAGNOSIS — R0602 Shortness of breath: Secondary | ICD-10-CM | POA: Diagnosis not present

## 2020-10-31 LAB — BASIC METABOLIC PANEL
Anion gap: 10 (ref 5–15)
BUN: 17 mg/dL (ref 8–23)
CO2: 23 mmol/L (ref 22–32)
Calcium: 8.7 mg/dL — ABNORMAL LOW (ref 8.9–10.3)
Chloride: 103 mmol/L (ref 98–111)
Creatinine, Ser: 0.88 mg/dL (ref 0.61–1.24)
GFR, Estimated: >60 mL/min (ref >60)
Glucose, Bld: 148 mg/dL — ABNORMAL HIGH (ref 70–99)
Potassium: 4.6 mmol/L (ref 3.5–5.1)
Sodium: 136 mmol/L (ref 135–145)

## 2020-10-31 LAB — I-STAT ARTERIAL BLOOD GAS, ED
Acid-Base Excess: 0 mmol/L (ref 0.0–2.0)
Bicarbonate: 27.1 mmol/L (ref 20.0–28.0)
Calcium, Ion: 1.24 mmol/L (ref 1.15–1.40)
HCT: 50 % (ref 39.0–52.0)
Hemoglobin: 17 g/dL (ref 13.0–17.0)
O2 Saturation: 99 %
Patient temperature: 98.4
Potassium: 4.3 mmol/L (ref 3.5–5.1)
Sodium: 139 mmol/L (ref 135–145)
TCO2: 29 mmol/L (ref 22–32)
pCO2 arterial: 52.3 mmHg — ABNORMAL HIGH (ref 32.0–48.0)
pH, Arterial: 7.322 — ABNORMAL LOW (ref 7.350–7.450)
pO2, Arterial: 150 mmHg — ABNORMAL HIGH (ref 83.0–108.0)

## 2020-10-31 LAB — SARS CORONAVIRUS 2 (TAT 6-24 HRS): SARS Coronavirus 2: NEGATIVE

## 2020-10-31 LAB — CBC
HCT: 49.4 % (ref 39.0–52.0)
Hemoglobin: 16.7 g/dL (ref 13.0–17.0)
MCH: 34.8 pg — ABNORMAL HIGH (ref 26.0–34.0)
MCHC: 33.8 g/dL (ref 30.0–36.0)
MCV: 102.9 fL — ABNORMAL HIGH (ref 80.0–100.0)
Platelets: 163 10*3/uL (ref 150–400)
RBC: 4.8 MIL/uL (ref 4.22–5.81)
RDW: 13 % (ref 11.5–15.5)
WBC: 14.6 10*3/uL — ABNORMAL HIGH (ref 4.0–10.5)
nRBC: 0 % (ref 0.0–0.2)

## 2020-10-31 LAB — TROPONIN I (HIGH SENSITIVITY)
Troponin I (High Sensitivity): 42 ng/L — ABNORMAL HIGH (ref <18)
Troponin I (High Sensitivity): 44 ng/L — ABNORMAL HIGH (ref <18)
Troponin I (High Sensitivity): 35 ng/L — ABNORMAL HIGH (ref <18)
Troponin I (High Sensitivity): 34 ng/L — ABNORMAL HIGH (ref <18)

## 2020-10-31 LAB — RESP PANEL BY RT-PCR (FLU A&B, COVID) ARPGX2
Influenza A by PCR: NEGATIVE
Influenza B by PCR: NEGATIVE
SARS Coronavirus 2 by RT PCR: NEGATIVE

## 2020-10-31 LAB — MAGNESIUM: Magnesium: 2 mg/dL (ref 1.7–2.4)

## 2020-10-31 LAB — HIV ANTIBODY (ROUTINE TESTING W REFLEX): HIV Screen 4th Generation wRfx: NONREACTIVE

## 2020-10-31 MED ORDER — UMECLIDINIUM-VILANTEROL 62.5-25 MCG/INH IN AEPB
1.0000 | INHALATION_SPRAY | Freq: Every day | RESPIRATORY_TRACT | Status: DC
  Administered 2020-11-01 – 2020-11-08 (×7): 1 via RESPIRATORY_TRACT
  Filled 2020-10-31: qty 14

## 2020-10-31 MED ORDER — ALBUTEROL (5 MG/ML) CONTINUOUS INHALATION SOLN
2.5000 mg/h | INHALATION_SOLUTION | RESPIRATORY_TRACT | Status: AC
  Administered 2020-10-31: 2.5 mg/h via RESPIRATORY_TRACT
  Filled 2020-10-31: qty 20

## 2020-10-31 MED ORDER — ROSUVASTATIN CALCIUM 20 MG PO TABS
20.0000 mg | ORAL_TABLET | Freq: Every day | ORAL | Status: DC
  Administered 2020-10-31 – 2020-11-09 (×10): 20 mg via ORAL
  Filled 2020-10-31 (×10): qty 1

## 2020-10-31 MED ORDER — ACETAMINOPHEN 325 MG PO TABS: 650.0000 mg | ORAL_TABLET | Freq: Four times a day (QID) | ORAL | Status: DC | PRN

## 2020-10-31 MED ORDER — ENOXAPARIN SODIUM 40 MG/0.4ML IJ SOSY
40.0000 mg | PREFILLED_SYRINGE | INTRAMUSCULAR | Status: DC
  Administered 2020-10-31 – 2020-11-03 (×4): 40 mg via SUBCUTANEOUS
  Filled 2020-10-31 (×4): qty 0.4

## 2020-10-31 MED ORDER — LORATADINE 10 MG PO TABS
10.0000 mg | ORAL_TABLET | Freq: Every day | ORAL | Status: DC
  Administered 2020-10-31 – 2020-11-09 (×10): 10 mg via ORAL
  Filled 2020-10-31 (×10): qty 1

## 2020-10-31 MED ORDER — SODIUM CHLORIDE 0.9 % IV SOLN: 250.0000 mL | INTRAVENOUS | Status: DC | PRN

## 2020-10-31 MED ORDER — PREDNISONE 20 MG PO TABS
40.0000 mg | ORAL_TABLET | Freq: Every day | ORAL | Status: DC
  Administered 2020-11-01: 40 mg via ORAL
  Filled 2020-10-31: qty 2

## 2020-10-31 MED ORDER — ACETAMINOPHEN 650 MG RE SUPP: 650.0000 mg | Freq: Four times a day (QID) | RECTAL | Status: DC | PRN

## 2020-10-31 MED ORDER — POLYETHYLENE GLYCOL 3350 17 G PO PACK: 17.0000 g | PACK | Freq: Every day | ORAL | Status: DC | PRN

## 2020-10-31 MED ORDER — IPRATROPIUM BROMIDE 0.02 % IN SOLN
0.5000 mg | Freq: Once | RESPIRATORY_TRACT | Status: AC
  Administered 2020-10-31: 0.5 mg via RESPIRATORY_TRACT
  Filled 2020-10-31: qty 2.5

## 2020-10-31 MED ORDER — METHYLPREDNISOLONE SODIUM SUCC 125 MG IJ SOLR
125.0000 mg | Freq: Once | INTRAMUSCULAR | Status: AC
  Administered 2020-10-31: 125 mg via INTRAVENOUS
  Filled 2020-10-31: qty 2

## 2020-10-31 MED ORDER — SODIUM CHLORIDE 0.9% FLUSH
3.0000 mL | Freq: Two times a day (BID) | INTRAVENOUS | Status: DC
  Administered 2020-10-31 – 2020-11-09 (×17): 3 mL via INTRAVENOUS

## 2020-10-31 MED ORDER — ALBUTEROL SULFATE (2.5 MG/3ML) 0.083% IN NEBU
INHALATION_SOLUTION | RESPIRATORY_TRACT | Status: AC
  Filled 2020-10-31: qty 3

## 2020-10-31 MED ORDER — NICOTINE 21 MG/24HR TD PT24
21.0000 mg | MEDICATED_PATCH | Freq: Every day | TRANSDERMAL | Status: DC
  Administered 2020-10-31 – 2020-11-09 (×10): 21 mg via TRANSDERMAL
  Filled 2020-10-31 (×10): qty 1

## 2020-10-31 MED ORDER — ALBUTEROL SULFATE (2.5 MG/3ML) 0.083% IN NEBU
5.0000 mg | INHALATION_SOLUTION | Freq: Once | RESPIRATORY_TRACT | Status: AC
  Administered 2020-10-31: 5 mg via RESPIRATORY_TRACT
  Filled 2020-10-31: qty 6

## 2020-10-31 MED ORDER — SODIUM CHLORIDE 0.9% FLUSH: 3.0000 mL | INTRAVENOUS | Status: DC | PRN

## 2020-10-31 MED ORDER — IPRATROPIUM-ALBUTEROL 0.5-2.5 (3) MG/3ML IN SOLN
3.0000 mL | Freq: Four times a day (QID) | RESPIRATORY_TRACT | Status: DC
  Administered 2020-10-31 (×2): 3 mL via RESPIRATORY_TRACT
  Filled 2020-10-31 (×2): qty 3

## 2020-10-31 MED ORDER — CLOPIDOGREL BISULFATE 75 MG PO TABS
75.0000 mg | ORAL_TABLET | Freq: Every day | ORAL | Status: DC
  Administered 2020-10-31 – 2020-11-07 (×8): 75 mg via ORAL
  Filled 2020-10-31 (×8): qty 1

## 2020-10-31 NOTE — ED Notes (Signed)
This RN called the [facility] about flu swab, [facility] stated they do not have single flu swabs so the flu+COVID swab needs to be ordered. Orders changed, admitting provider made awart

## 2020-10-31 NOTE — H&P (Addendum)
History and Physical:    Brady Brooks   CZY:[id number] DOB: 10-Sep-1950 DOA: 10/31/2020  Referring MD/provider: Acquanetta Chain PCP: Brady Ruddy, MD   Patient coming from: Home  Chief Complaint: Increasing shortness of breath over the past 1-2 weeks  History of Present Illness:   Brady PALMERI Sr. is an 70 y.o. male with 100 pack-year tobacco history, COPD, seasonal allergies, PVD/CAD who was in his usual state of health until 1 to 2 weeks ago when he noted progressive increase in shortness of breath.  Patient notes that 2 weeks ago he was mowing his lawn without difficulty.  Shortness of breath became progressive and he treated with increased inhaled bronchodilators at home.  Over the last 3 weeks however patient has been so short of breath that he has been short of breath walking around the house.  Now he notes "I could not walk to the door without giving out".  Patient states he has never had shortness of breath as bad as this.  Patient denies any fevers or chills.  No new cough.  No new sputum production.  Patient admits to some intermittent chest tightness however this resolved with inhaled bronchodilators in the ED.  ED Course: Patient was noted to be afebrile, marked tachypnea with new oxygen requirement.  He had wheezes on exam.  Chest x-ray showed chronic changes of emphysema and pulmonary fibrosis.  The patient was treated with DuoNeb x1 and 125 mg Solu-Medrol with decreased wheezing.  ROS:   ROS   Review of Systems: General: Denies fever, chills, malaise,  Eyes: Denies recent change in vision, no discharge, redness, pain noted Endocrine: Denies heat/cold intolerance, polyuria or weight loss. Respiratory: Denies cough, hemoptysis Cardiovascular: Denies palpitations GI: Denies nausea, vomiting, diarrhea or constipation GU: Denies dysuria, frequency or hematuria CNS: Denies HA, dizziness, confusion, new weakness or clumsiness. Blood/lymphatics: Denies easy  bruising or bleeding Mood/affect: Denies anxiety/depression    Past Medical History:   Past Medical History:  Diagnosis Date   COPD (chronic obstructive pulmonary disease) ([place])    Coronary artery disease    a. s/p RCA stenting back in 2005 with a known chronically occluded circumflex and normal LV function.   Emphysema lung (HCC)    GERD (gastroesophageal reflux disease)    Heart attack ([place]) <2005   History of tobacco abuse    Hyperlipidemia    Hypertension    Lung nodule    a. Suspicious for malignancy, undergoing workup.   Obesity    Peripheral arterial disease ([place])    a. history of stent to right common iliac artery 12/15/99 with a peak for balloon-expandable stent. b. s/p intervention on RCIA total occlusion PTA and stent, residual disease on the right for possible staged intervention, notable disease on the left but asymptomatic in 2015. c. 10/2016: s/p PTA and stenting of left common iliac    Past Surgical History:   Past Surgical History:  Procedure Laterality Date   CARDIAC CATHETERIZATION  10/26/1999   Archie Endo 09/13/2010   Cardiolite study     59% ejection fraction and negative for ischemia   CORONARY ANGIOPLASTY WITH STENT PLACEMENT  2005   Taxus stent placed to his RCA    [place] Right 12/15/1999   a. history of stent to right common iliac artery 12/15/99 with a peak for balloon-expandable stent. b. s/p intervention on RCIA total occlusion PTA and stent, residual disease on the right for possible staged intervention,  notable disease on the left but asymptomatic.   ILIAC VEIN ANGIOPLASTY / STENTING Right 03/16/2014   rt common iliac    by dr berry   LAPAROSCOPIC CHOLECYSTECTOMY  1990s   LOWER EXTREMITY ANGIOGRAM N/A 03/16/2014   Procedure: LOWER EXTREMITY ANGIOGRAM;  Surgeon: [name], MD;  Location: [facility] CATH LAB;  Service: Cardiovascular;  Laterality: N/A;   LOWER EXTREMITY ANGIOGRAPHY N/A 10/30/2016   Procedure: Lower Extremity  Angiography;  Surgeon: [name], MD;  Location: [facility] CV LAB;  Service: Cardiovascular;  Laterality: N/A;   LOWER EXTREMITY ANGIOGRAPHY N/A 11/30/2016   Procedure: Lower Extremity Angiography;  Surgeon: [name], MD;  Location: [facility] CV LAB;  Service: Cardiovascular;  Laterality: N/A;   PERIPHERAL VASCULAR INTERVENTION Left 10/30/2016   Procedure: Peripheral Vascular Intervention;  Surgeon: [name], MD;  Location: [facility] CV LAB;  Service: Cardiovascular;  Laterality: Left;  Lt Com ILIAC   TONSILLECTOMY AND ADENOIDECTOMY  [id number]   UMBILICAL HERNIA REPAIR  2012    Social History:   Social History   Socioeconomic History   Marital status: Married    Spouse name: Brady Brooks   Number of children: 2   Years of education: Not on file   Highest education level: Not on file  Occupational History   Occupation: textile manf  Tobacco Use   Smoking status: Every Day    Packs/day: 2.00    Years: 50.00    Pack years: 100.00    Types: Cigarettes   Smokeless tobacco: Never   Tobacco comments:    currently smoking 1ppd as of 4/8  Vaping Use   Vaping Use: Never used  Substance and Sexual Activity   Alcohol use: Yes    Comment: 10/30/2016 "might have a few drinks 4-5 times/year"   Drug use: No   Sexual activity: Not Currently  Other Topics Concern   Not on file  Social History Narrative   Not on file   Social Determinants of Health   Financial Resource Strain: Not on file  Food Insecurity: Not on file  Transportation Needs: Not on file  Physical Activity: Not on file  Stress: Not on file  Social Connections: Not on file  Intimate Partner Violence: Not on file    Allergies   Amlodipine, Aspirin, Contrast media [iodinated diagnostic agents], Erythromycin, Losartan potassium, Penicillins, Simvastatin, and Sulfa antibiotics  Family history:   Family History  Problem Relation Age of Onset   Heart disease Father    Asthma Son    Lung cancer  Paternal Grandfather    CAD Brother    CAD Brother     Current Medications:   Prior to Admission medications   Medication Sig Start Date End Date Taking? Authorizing Provider  albuterol (VENTOLIN HFA) 108 (90 Base) MCG/ACT inhaler Inhale 2 puffs into the lungs every 6 (six) hours as needed for wheezing or shortness of breath. 02/12/20  Yes Young, Tarri Fuller D, MD  clopidogrel (PLAVIX) 75 MG tablet TAKE 1 TABLET(75 MG) BY MOUTH DAILY Patient taking differently: Take 75 mg by mouth daily. 08/27/20  Yes [name], MD  Coenzyme Q10 (COQ10) 100 MG CAPS Take 100 mg by mouth daily.   Yes [provider]  fexofenadine (ALLEGRA) 180 MG tablet Take 180 mg by mouth daily.    Yes [provider]  fluticasone (FLONASE) 50 MCG/ACT nasal spray Place 1 spray into both nostrils daily. 10/13/19  Yes Deneise Lever, MD  Fluticasone-Umeclidin-Vilant (TRELEGY ELLIPTA) 100-62.5-25 MCG/INH  AEPB Inhale 1 puff into the lungs daily. 08/27/20  Yes Young, Clinton D, MD  ipratropium-albuterol (DUONEB) 0.5-2.5 (3) MG/3ML SOLN Take 3 mLs by nebulization every 6 (six) hours as needed. Patient taking differently: Take 3 mLs by nebulization every 6 (six) hours as needed (shortness of breath). 08/06/20  Yes Young, Tarri Fuller D, MD  phenylephrine (SUDAFED PE) 10 MG TABS tablet Take 10 mg by mouth every 6 (six) hours as needed (congestion).   Yes [provider]  rosuvastatin (CRESTOR) 20 MG tablet TAKE 1 TABLET(20 MG) BY MOUTH DAILY Patient taking differently: Take 20 mg by mouth daily. 10/07/19  Yes [name], MD  spironolactone (ALDACTONE) 25 MG tablet TAKE 1 TABLET(25 MG) BY MOUTH DAILY Patient taking differently: Take 25 mg by mouth daily. 04/02/20  Yes [name], MD  triamcinolone (NASACORT) 55 MCG/ACT AERO nasal inhaler Place 2 sprays into the nose daily as needed (allergies).   Yes [provider]    Physical Exam:   Vitals:   10/31/20 0615 10/31/20 0630 10/31/20 0645  10/31/20 0800  BP: 111/84 102/68 139/87 129/72  Pulse: 83 76 86 83  Resp: 19 18 18  (!) 23  Temp:      TempSrc:      SpO2: 93% 95% 95% 95%  Weight:      Height:         Physical Exam: Blood pressure 129/72, pulse 83, temperature 98.4 F (36.9 C), temperature source Oral, resp. rate (!) 23, height 5\' 10"  (1.778 m), weight 88 kg, SpO2 95 %. Gen: Patient sitting up in bed with marked tachypnea, deep Kussmaul type breathing including using his abdominal muscles.  Patient is able to speak in very short sentences before having to take a breath.  Patient does get more short of breath with speaking. Eyes: sclera anicteric, conjuctiva mildly injected bilaterally CVS: S1-S2, regular, no gallops Respiratory: Patient with decreased air entry bilaterally without any rales or wheezes on my exam. GI: NABS, soft, NT  LE: No edema. No cyanosis Neuro: A/O x 3, Moving all extremities equally with normal strength, CN 3-12 intact, grossly nonfocal.  Psych: patient is logical and coherent, judgement and insight appear normal, mood and affect appropriate to situation. Skin: no rashes or lesions or ulcers,    Data Review:    Labs: Basic Metabolic Panel: Recent Labs  Lab 10/31/20 0224  NA 136  K 4.6  CL 103  CO2 23  GLUCOSE 148*  BUN 17  CREATININE 0.88  CALCIUM 8.7*  MG 2.0   Liver Function Tests: No results for input(s): AST, ALT, ALKPHOS, BILITOT, PROT, ALBUMIN in the last 168 hours. No results for input(s): LIPASE, AMYLASE in the last 168 hours. No results for input(s): AMMONIA in the last 168 hours. CBC: Recent Labs  Lab 10/31/20 0224  WBC 14.6*  HGB 16.7  HCT 49.4  MCV 102.9*  PLT 163   Cardiac Enzymes: No results for input(s): CKTOTAL, CKMB, CKMBINDEX, TROPONINI in the last 168 hours.  BNP (last 3 results) No results for input(s): PROBNP in the last 8760 hours. CBG: No results for input(s): GLUCAP in the last 168 hours.  Urinalysis No results found for: COLORURINE,  APPEARANCEUR, LABSPEC, PHURINE, GLUCOSEU, HGBUR, BILIRUBINUR, KETONESUR, PROTEINUR, UROBILINOGEN, NITRITE, LEUKOCYTESUR    Radiographic Studies: DG Chest Portable 1 View  Result Date: 10/31/2020 CLINICAL DATA:  Chest pain and shortness of breath EXAM: PORTABLE CHEST 1 VIEW COMPARISON:  02/26/2020 FINDINGS: Heart size and pulmonary vascularity are normal. Bronchiectasis, peribronchial  thickening, and coarse interstitial infiltrates in both lungs, similar to prior study. These likely represent chronic bronchitis and fibrosis. Emphysematous changes in the upper lungs. No definite focal infiltrates. No pleural effusions. No pneumothorax. Mediastinal contours appear intact. IMPRESSION: Emphysematous changes, chronic bronchitic changes, and fibrosis in the lungs, similar to prior study. Electronically Signed   By: Lucienne Capers M.D.   On: 10/31/2020 03:03    EKG: Independently reviewed.  Sinus rhythm at 80.  P pulmonale.  Mild IVCD.  Normal axis.  Isolated Q in V1.  No acute ST-T wave changes.   Assessment/Plan:   Principal Problem:   COPD with acute exacerbation (HCC) Active Problems:   Coronary artery disease   Tobacco abuse   Essential hypertension   PVD (peripheral vascular disease) ([place])  70 year old man who has been smoking since he was 32 presents with COPD flare.  Chest x-ray shows fibrosis and emphysematous changes.  Acute exacerbation of COPD with fibrosis seen on chest x-ray. Patient is increased work of breathing with abdominal breathing, symptoms have been present for the past 1 to 2 weeks at home. Will provide continuous albuterol nebulizer x1 hour at present. Will check ABG for baseline. Continue oral steroids, prednisone 40 daily ordered. LABA plus LAMA ordered once daily DuoNebs every 6 hours standing ordered Will need to follow work of breathing closely. No antibiotics as patient does not have a new cough or increased sputum production. Patient does have an oxygen  requirement here, apparently does not use oxygen at home.  Given chest x-ray with fibrosis but no focal infiltrates, I wonder if he has been hypoxic at home for a while.  Patient may need home O2 upon discharge.  CAD/PVD Chest tightness likely secondary to bronchospasm EKG is without any acute changes, troponins are minimally elevated Will follow troponins to make sure they do not rise Continue Plavix and rosuvastatin  Ongoing tobacco abuse Patient knows he has to stop smoking Nicotine patch ordered RN to provide tobacco cessation education.      Other information:   DVT prophylaxis: Lovenox ordered. Code Status: Full Family Communication: Patient states his family knows he is here Disposition Plan: Home Consults called: None Admission status: Inpatient  [facility]  If 7PM-7AM, please contact night-coverage [email or web address]

## 2020-10-31 NOTE — ED Provider Notes (Signed)
Emergency Medicine Provider Triage Evaluation Note  Brady Crumb Sr. , a 70 y.o. male  was evaluated in triage.  Pt complains of SOB and CP.  Hx of COPD.  Acute shortness of breath. Does not wear oxygen at home.  Review of Systems  Positive: SOB, CP Negative: Fever, chills  Physical Exam  BP 124/85 (BP Location: Left Arm)   Pulse 77   Temp 98.4 F (36.9 C) (Oral)   Resp (!) 21   Ht 5\' 10"  (1.778 m)   Wt 88 kg   SpO2 96%   BMI 27.84 kg/m  Gen:   Awake, respiratory distress Resp:  Increased WOB, faint expiratory wheezes, almost silent chest MSK:   Moves extremities without difficulty  Other:  Chronically ill appearing; on oxygen 2L via   Medical Decision Making  Medically screening exam initiated at 2:44 AM.  Appropriate orders placed.  Brady Crumb Sr. was informed that the remainder of the evaluation will be completed by another provider, this initial triage assessment does not replace that evaluation, and the importance of remaining in the ED until their evaluation is complete.  Acute respiratory distress - treatment initiated in triage - pt will need the next room.   Brady Brooks, Brady Brooks 10/31/20 0246    Brady Greek, MD 10/31/20 [phone]

## 2020-10-31 NOTE — ED Provider Notes (Signed)
[facility] Emergency Department Provider Note MRN:  [id number]  Arrival date & time: 10/31/20     Chief Complaint   Shortness of Breath and Chest Pain   History of Present Illness   Brady DOLLEY Sr. is a 70 y.o. year-old male with a history of COPD presenting to the ED with chief complaint of shortness of breath.  Worsening shortness of breath over the past 2 days, gradual onset, now is moderate to severe.  Cannot catch his breath.  Endorsing some chest tightness as well.  Denies any abdominal pain, no fever, no change to cough, no numbness or weakness to the arms or legs  Review of Systems  A complete 10 system review of systems was obtained and all systems are negative except as noted in the HPI and PMH.   Patient's Health History    Past Medical History:  Diagnosis Date   COPD (chronic obstructive pulmonary disease) ([place])    Coronary artery disease    a. s/p RCA stenting back in 2005 with a known chronically occluded circumflex and normal LV function.   Emphysema lung (HCC)    GERD (gastroesophageal reflux disease)    Heart attack ([place]) <2005   History of tobacco abuse    Hyperlipidemia    Hypertension    Lung nodule    a. Suspicious for malignancy, undergoing workup.   Obesity    Peripheral arterial disease ([place])    a. history of stent to right common iliac artery 12/15/99 with a peak for balloon-expandable stent. b. s/p intervention on RCIA total occlusion PTA and stent, residual disease on the right for possible staged intervention, notable disease on the left but asymptomatic in 2015. c. 10/2016: s/p PTA and stenting of left common iliac    Past Surgical History:  Procedure Laterality Date   CARDIAC CATHETERIZATION  10/26/1999   Archie Endo 09/13/2010   Cardiolite study     59% ejection fraction and negative for ischemia   CORONARY ANGIOPLASTY WITH STENT PLACEMENT  2005   Taxus stent placed to his RCA    [place]  Right 12/15/1999   a. history of stent to right common iliac artery 12/15/99 with a peak for balloon-expandable stent. b. s/p intervention on RCIA total occlusion PTA and stent, residual disease on the right for possible staged intervention, notable disease on the left but asymptomatic.   ILIAC VEIN ANGIOPLASTY / STENTING Right 03/16/2014   rt common iliac    by dr berry   LAPAROSCOPIC CHOLECYSTECTOMY  1990s   LOWER EXTREMITY ANGIOGRAM N/A 03/16/2014   Procedure: LOWER EXTREMITY ANGIOGRAM;  Surgeon: [name], MD;  Location: [facility] CATH LAB;  Service: Cardiovascular;  Laterality: N/A;   LOWER EXTREMITY ANGIOGRAPHY N/A 10/30/2016   Procedure: Lower Extremity Angiography;  Surgeon: [name], MD;  Location: [place] CV LAB;  Service: Cardiovascular;  Laterality: N/A;   LOWER EXTREMITY ANGIOGRAPHY N/A 11/30/2016   Procedure: Lower Extremity Angiography;  Surgeon: [name], MD;  Location: [facility] CV LAB;  Service: Cardiovascular;  Laterality: N/A;   PERIPHERAL VASCULAR INTERVENTION Left 10/30/2016   Procedure: Peripheral Vascular Intervention;  Surgeon: [name], MD;  Location: [place] CV LAB;  Service: Cardiovascular;  Laterality: Left;  Lt Com ILIAC   TONSILLECTOMY AND ADENOIDECTOMY  [id number]   UMBILICAL HERNIA REPAIR  2012    Family History  Problem Relation Age of Onset   Heart disease Father  Asthma Son    Lung cancer Paternal Grandfather    CAD Brother    CAD Brother     Social History   Socioeconomic History   Marital status: Married    Spouse name: Olin Hauser   Number of children: 2   Years of education: Not on file   Highest education level: Not on file  Occupational History   Occupation: textile manf  Tobacco Use   Smoking status: Every Day    Packs/day: 2.00    Years: 50.00    Pack years: 100.00    Types: Cigarettes   Smokeless tobacco: Never   Tobacco comments:    currently smoking 1ppd as of 4/8  Vaping Use   Vaping Use: Never used   Substance and Sexual Activity   Alcohol use: Yes    Comment: 10/30/2016 "might have a few drinks 4-5 times/year"   Drug use: No   Sexual activity: Not Currently  Other Topics Concern   Not on file  Social History Narrative   Not on file   Social Determinants of Health   Financial Resource Strain: Not on file  Food Insecurity: Not on file  Transportation Needs: Not on file  Physical Activity: Not on file  Stress: Not on file  Social Connections: Not on file  Intimate Partner Violence: Not on file     Physical Exam   Vitals:   10/31/20 0430 10/31/20 0445  BP: 137/76 132/80  Pulse: 65 83  Resp: 19 (!) 24  Temp:    SpO2: 93% 93%    CONSTITUTIONAL: Well-appearing, NAD NEURO:  Alert and oriented x 3, no focal deficits EYES:  eyes equal and reactive ENT/NECK:  no LAD, no JVD CARDIO: Regular rate, well-perfused, normal S1 and S2 PULM: Poor air movement, scattered wheeze GI/GU:  normal bowel sounds, non-distended, non-tender MSK/SPINE:  No gross deformities, no edema SKIN:  no rash, atraumatic PSYCH:  Appropriate speech and behavior  *Additional and/or pertinent findings included in MDM below  Diagnostic and Interventional Summary    EKG Interpretation  Date/Time:  Sunday October 31 2020 02:29:50 EDT Ventricular Rate:  78 PR Interval:  162 QRS Duration: 92 QT Interval:  376 QTC Calculation: 428 R Axis:   76 Text Interpretation: Normal sinus rhythm Right atrial enlargement Nonspecific ST and T wave abnormality Abnormal ECG Confirmed by Gerlene Fee [phone]) on 10/31/2020 4:57:19 AM        Labs Reviewed  BASIC METABOLIC PANEL - Abnormal; Notable for the following components:      Result Value   Glucose, Bld 148 (*)    Calcium 8.7 (*)    All other components within normal limits  CBC - Abnormal; Notable for the following components:   WBC 14.6 (*)    MCV 102.9 (*)    MCH 34.8 (*)    All other components within normal limits  TROPONIN I (HIGH SENSITIVITY) -  Abnormal; Notable for the following components:   Troponin I (High Sensitivity) 42 (*)    All other components within normal limits  SARS CORONAVIRUS 2 (TAT 6-24 HRS)  MAGNESIUM  TROPONIN I (HIGH SENSITIVITY)    DG Chest Portable 1 View  Final Result      Medications  albuterol (PROVENTIL) (2.5 MG/3ML) 0.083% nebulizer solution 5 mg (5 mg Nebulization Given 10/31/20 0253)  ipratropium (ATROVENT) nebulizer solution 0.5 mg (0.5 mg Nebulization Given 10/31/20 0253)  methylPREDNISolone sodium succinate (SOLU-MEDROL) 125 mg/2 mL injection 125 mg (125 mg Intravenous Given 10/31/20 0258)  Procedures  /  Critical Care .Critical Care  Date/Time: 10/31/2020 4:59 AM Performed by: Maudie Flakes, MD Authorized by: Maudie Flakes, MD   Critical care provider statement:    Critical care time (minutes):  45   Critical care was necessary to treat or prevent imminent or life-threatening deterioration of the following conditions:  Respiratory failure   Critical care was time spent personally by me on the following activities:  Discussions with consultants, evaluation of patient's response to treatment, examination of patient, ordering and performing treatments and interventions, ordering and review of laboratory studies, ordering and review of radiographic studies, pulse oximetry, re-evaluation of patient's condition, obtaining history from patient or surrogate and review of old charts  ED Course and Medical Decision Making  I have reviewed the triage vital signs, the nursing notes, and pertinent available records from the EMR.  Listed above are laboratory and imaging tests that I personally ordered, reviewed, and interpreted and then considered in my medical decision making (see below for details).  Consistent with a COPD exacerbation, requiring 4 L nasal cannula.  Clinically much improved after nebs, steroids, will admit to medicine.       Barth Kirks. Sedonia Small, MD [place] [email or web address]  Final Clinical Impressions(s) / ED Diagnoses     ICD-10-CM   1. COPD exacerbation ([place])  J44.1       ED Discharge Orders     None        Discharge Instructions Discussed with and Provided to Patient:   Discharge Instructions   None       Maudie Flakes, MD 10/31/20 0500

## 2020-10-31 NOTE — ED Triage Notes (Signed)
Pt BIB [facility]. Reports being awaken from sleep c/o severe SOB and CP. S/S improved with 2L O2 and rest. Describes pain as chest tightness 2/10 along with SOB, labored breathing in triage.  HX COPD, STEMI, blood clots.

## 2020-11-01 ENCOUNTER — Inpatient Hospital Stay (HOSPITAL_COMMUNITY): Payer: Medicare Other

## 2020-11-01 DIAGNOSIS — J441 Chronic obstructive pulmonary disease with (acute) exacerbation: Secondary | ICD-10-CM | POA: Diagnosis not present

## 2020-11-01 LAB — BASIC METABOLIC PANEL
Anion gap: 7 (ref 5–15)
BUN: 25 mg/dL — ABNORMAL HIGH (ref 8–23)
CO2: 27 mmol/L (ref 22–32)
Calcium: 9.1 mg/dL (ref 8.9–10.3)
Chloride: 105 mmol/L (ref 98–111)
Creatinine, Ser: 1.1 mg/dL (ref 0.61–1.24)
GFR, Estimated: >60 mL/min (ref >60)
Glucose, Bld: 131 mg/dL — ABNORMAL HIGH (ref 70–99)
Potassium: 4.3 mmol/L (ref 3.5–5.1)
Sodium: 139 mmol/L (ref 135–145)

## 2020-11-01 LAB — D-DIMER, QUANTITATIVE: D-Dimer, Quant: 0.85 ug/mL-FEU — ABNORMAL HIGH (ref 0.00–0.50)

## 2020-11-01 LAB — VITAMIN D 25 HYDROXY (VIT D DEFICIENCY, FRACTURES): Vit D, 25-Hydroxy: 40.27 ng/mL (ref 30–100)

## 2020-11-01 MED ORDER — ALBUTEROL SULFATE (2.5 MG/3ML) 0.083% IN NEBU: 2.5000 mg | INHALATION_SOLUTION | Freq: Four times a day (QID) | RESPIRATORY_TRACT | Status: DC | PRN

## 2020-11-01 MED ORDER — DIPHENHYDRAMINE HCL 25 MG PO CAPS
50.0000 mg | ORAL_CAPSULE | Freq: Once | ORAL | Status: AC
  Administered 2020-11-01: 50 mg via ORAL
  Filled 2020-11-01: qty 2

## 2020-11-01 MED ORDER — GUAIFENESIN ER 600 MG PO TB12
1200.0000 mg | ORAL_TABLET | Freq: Two times a day (BID) | ORAL | Status: DC
  Administered 2020-11-01 – 2020-11-09 (×17): 1200 mg via ORAL
  Filled 2020-11-01 (×17): qty 2

## 2020-11-01 MED ORDER — HYDROCORTISONE NA SUCCINATE PF 250 MG IJ SOLR
200.0000 mg | Freq: Once | INTRAMUSCULAR | Status: AC
  Administered 2020-11-01: 200 mg via INTRAVENOUS
  Filled 2020-11-01: qty 200

## 2020-11-01 MED ORDER — IPRATROPIUM-ALBUTEROL 0.5-2.5 (3) MG/3ML IN SOLN
3.0000 mL | Freq: Three times a day (TID) | RESPIRATORY_TRACT | Status: DC
  Administered 2020-11-01: 3 mL via RESPIRATORY_TRACT
  Filled 2020-11-01: qty 3

## 2020-11-01 MED ORDER — METHYLPREDNISOLONE SODIUM SUCC 125 MG IJ SOLR
60.0000 mg | Freq: Three times a day (TID) | INTRAMUSCULAR | Status: DC
  Administered 2020-11-02 – 2020-11-04 (×7): 60 mg via INTRAVENOUS
  Filled 2020-11-01 (×7): qty 2

## 2020-11-01 MED ORDER — DIPHENHYDRAMINE HCL 50 MG/ML IJ SOLN: 50.0000 mg | Freq: Once | INTRAMUSCULAR | Status: AC

## 2020-11-01 MED ORDER — IPRATROPIUM-ALBUTEROL 0.5-2.5 (3) MG/3ML IN SOLN
3.0000 mL | Freq: Three times a day (TID) | RESPIRATORY_TRACT | Status: DC
  Administered 2020-11-02 – 2020-11-08 (×17): 3 mL via RESPIRATORY_TRACT
  Filled 2020-11-01 (×18): qty 3

## 2020-11-01 MED ORDER — IPRATROPIUM-ALBUTEROL 0.5-2.5 (3) MG/3ML IN SOLN
3.0000 mL | RESPIRATORY_TRACT | Status: DC
  Administered 2020-11-01 (×3): 3 mL via RESPIRATORY_TRACT
  Filled 2020-11-01 (×3): qty 3

## 2020-11-01 MED ORDER — METHYLPREDNISOLONE SODIUM SUCC 125 MG IJ SOLR
60.0000 mg | Freq: Three times a day (TID) | INTRAMUSCULAR | Status: DC
  Administered 2020-11-01: 60 mg via INTRAVENOUS
  Filled 2020-11-01: qty 2

## 2020-11-01 MED ORDER — IOHEXOL 350 MG/ML SOLN
50.0000 mL | Freq: Once | INTRAVENOUS | Status: AC | PRN
  Administered 2020-11-01: 50 mL via INTRAVENOUS

## 2020-11-01 NOTE — Progress Notes (Addendum)
PROGRESS NOTE    Brady SULTANA Sr.  TKW:[id number] DOB: February 24, 1951 DOA: 10/31/2020 PCP: Billie Ruddy, MD   Brief Narrative: 70 year old with past medical history significant for tobacco abuse, COPD, seasonal allergies, PVD/CAD who presents with 1 or 2 weeks progressive worsening shortness of breath.  He also report chest tightness. Patient was admitted for COPD exacerbation.   Assessment & Plan:   Principal Problem:   COPD with acute exacerbation ([place]) Active Problems:   Coronary artery disease   Tobacco abuse   Essential hypertension   PVD (peripheral vascular disease) (HCC)   1-Acute COPS exacerbation. Acute Hypercapnic Respiratory failure. Hypoxic.  -patient presents with congestion, thick secretion, SOB. ABG PCO2 52, compensated.  -Advise to stop smoking.  -Plan to get D dimer if elevated, will proceed with CT angio.  -Change nebulizer to every 4 hours.  -Continue with IV solumedrol.  -Start Guaifenesin. Flutter valve  -Continue with Loratadine, Anoro/ellipta.  -currently on 3 L oxygen, wean as tolerated.   2-CAD/PVD Chest tightness; Dyspnea on exertion.  EKG with PVC. Mild elevation of troponin.  Had presumably ECHO done last month at his cardiologist office.  Will Consult Cardiology.  Continue with Plavix and statins.  Will defer getting ECHO to cardiologist   3-Depression;  Per wife he has been depress and knowing he needs to stop smoking he will get more depress.  Psych consulted.   4-Check Vitamin D; per wife request.     Estimated body mass index is 27.84 kg/m as calculated from the following:   Height as of this encounter: 5\' 10"  (1.778 m).   Weight as of this encounter: 88 kg.   DVT prophylaxis: Lovenox Code Status: Full Code Family Communication: care discuss with wife who was at bedside.  Disposition Plan:  Status is: Inpatient  Remains inpatient appropriate because:IV treatments appropriate due to intensity of illness or inability to  take PO  Dispo: The patient is from: Home              Anticipated d/c is to: Home              Patient currently is not medically stable to d/c.   Difficult to place patient No        Consultants:  Cardiology   Procedures:  None  Antimicrobials:    Subjective: He report some improvement of SOB.  Has thick secretion. Cough.  Chest tightness, different than his COPD symptoms.    Objective: Vitals:   10/31/20 1756 10/31/20 2001 10/31/20 2348 11/01/20 0444  BP: 108/83 (!) 121/107 131/90 119/73  Pulse: 74 90 80 90  Resp: (!) 24 (!) 22 (!) 22 20  Temp: 98.3 F (36.8 C) 98.4 F (36.9 C) 98.5 F (36.9 C) 98 F (36.7 C)  TempSrc: Oral Oral Oral Oral  SpO2: 97% 94% 95% 97%  Weight:      Height:        Intake/Output Summary (Last 24 hours) at 11/01/2020 0805 Last data filed at 10/31/2020 9242 Gross per 24 hour  Intake --  Output 250 ml  Net -250 ml   Filed Weights   10/31/20 0229  Weight: 88 kg    Examination:  General exam: Appears calm and comfortable  Respiratory system: BL expiratory Wheezing, ronchus.  Cardiovascular system: S1 & S2 heard, RRR. Gastrointestinal system: Abdomen is nondistended, soft and nontender. No organomegaly or masses felt. Normal bowel sounds heard. Central nervous system: Alert and oriented.  Extremities: Symmetric 5 x 5 power.  Data Reviewed: I have personally reviewed following labs and imaging studies  CBC: Recent Labs  Lab 10/31/20 0224 10/31/20 1018  WBC 14.6*  --   HGB 16.7 17.0  HCT 49.4 50.0  MCV 102.9*  --   PLT 163  --    Basic Metabolic Panel: Recent Labs  Lab 10/31/20 0224 10/31/20 1018 11/01/20 0022  NA 136 139 139  K 4.6 4.3 4.3  CL 103  --  105  CO2 23  --  27  GLUCOSE 148*  --  131*  BUN 17  --  25*  CREATININE 0.88  --  1.10  CALCIUM 8.7*  --  9.1  MG 2.0  --   --    GFR: Estimated Creatinine Clearance: 70.8 mL/min (by C-G formula based on SCr of 1.1 mg/dL). Liver Function Tests: No  results for input(s): AST, ALT, ALKPHOS, BILITOT, PROT, ALBUMIN in the last 168 hours. No results for input(s): LIPASE, AMYLASE in the last 168 hours. No results for input(s): AMMONIA in the last 168 hours. Coagulation Profile: No results for input(s): INR, PROTIME in the last 168 hours. Cardiac Enzymes: No results for input(s): CKTOTAL, CKMB, CKMBINDEX, TROPONINI in the last 168 hours. BNP (last 3 results) No results for input(s): PROBNP in the last 8760 hours. HbA1C: No results for input(s): HGBA1C in the last 72 hours. CBG: No results for input(s): GLUCAP in the last 168 hours. Lipid Profile: No results for input(s): CHOL, HDL, LDLCALC, TRIG, CHOLHDL, LDLDIRECT in the last 72 hours. Thyroid Function Tests: No results for input(s): TSH, T4TOTAL, FREET4, T3FREE, THYROIDAB in the last 72 hours. Anemia Panel: No results for input(s): VITAMINB12, FOLATE, FERRITIN, TIBC, IRON, RETICCTPCT in the last 72 hours. Sepsis Labs: No results for input(s): PROCALCITON, LATICACIDVEN in the last 168 hours.  Recent Results (from the past 240 hour(s))  SARS CORONAVIRUS 2 (TAT 6-24 HRS) Nasopharyngeal Nasopharyngeal Swab     Status: None   Collection Time: 10/31/20  2:44 AM   Specimen: Nasopharyngeal Swab  Result Value Ref Range Status   SARS Coronavirus 2 NEGATIVE NEGATIVE Final    Comment: (NOTE) SARS-CoV-2 target nucleic acids are NOT DETECTED.  The SARS-CoV-2 RNA is generally detectable in upper and lower respiratory specimens during the acute phase of infection. Negative results do not preclude SARS-CoV-2 infection, do not rule out co-infections with other pathogens, and should not be used as the sole basis for treatment or other patient management decisions. Negative results must be combined with clinical observations, patient history, and epidemiological information. The expected result is Negative.  Fact Sheet for Patients: SugarRoll.be  Fact Sheet for  Healthcare Providers: https://www.woods-mathews.com/  This test is not yet approved or cleared by the [place] FDA and  has been authorized for detection and/or diagnosis of SARS-CoV-2 by FDA under an Emergency Use Authorization (EUA). This EUA will remain  in effect (meaning this test can be used) for the duration of the COVID-19 declaration under Se ction 564(b)(1) of the Act, 21 U.S.C. section 360bbb-3(b)(1), unless the authorization is terminated or revoked sooner.  Performed at [facility], [place] [address] [place], [place] [address]   Resp Panel by RT-PCR (Flu A&B, Covid) Nasopharyngeal Swab     Status: None   Collection Time: 10/31/20 10:32 AM   Specimen: Nasopharyngeal Swab; Nasopharyngeal(NP) swabs in vial transport medium  Result Value Ref Range Status   SARS Coronavirus 2 by RT PCR NEGATIVE NEGATIVE Final    Comment: (NOTE) SARS-CoV-2 target nucleic acids  are NOT DETECTED.  The SARS-CoV-2 RNA is generally detectable in upper respiratory specimens during the acute phase of infection. The lowest concentration of SARS-CoV-2 viral copies this assay can detect is 138 copies/mL. A negative result does not preclude SARS-Cov-2 infection and should not be used as the sole basis for treatment or other patient management decisions. A negative result may occur with  improper specimen collection/handling, submission of specimen other than nasopharyngeal swab, presence of viral mutation(s) within the areas targeted by this assay, and inadequate number of viral copies(<138 copies/mL). A negative result must be combined with clinical observations, patient history, and epidemiological information. The expected result is Negative.  Fact Sheet for Patients:  EntrepreneurPulse.com.au  Fact Sheet for Healthcare Providers:  IncredibleEmployment.be  This test is no t yet approved or cleared by the [place] FDA and  has been  authorized for detection and/or diagnosis of SARS-CoV-2 by FDA under an Emergency Use Authorization (EUA). This EUA will remain  in effect (meaning this test can be used) for the duration of the COVID-19 declaration under Section 564(b)(1) of the Act, 21 U.S.C.section 360bbb-3(b)(1), unless the authorization is terminated  or revoked sooner.       Influenza A by PCR NEGATIVE NEGATIVE Final   Influenza B by PCR NEGATIVE NEGATIVE Final    Comment: (NOTE) The Xpert Xpress SARS-CoV-2/FLU/RSV plus assay is intended as an aid in the diagnosis of influenza from Nasopharyngeal swab specimens and should not be used as a sole basis for treatment. Nasal washings and aspirates are unacceptable for Xpert Xpress SARS-CoV-2/FLU/RSV testing.  Fact Sheet for Patients: EntrepreneurPulse.com.au  Fact Sheet for Healthcare Providers: IncredibleEmployment.be  This test is not yet approved or cleared by the [place] FDA and has been authorized for detection and/or diagnosis of SARS-CoV-2 by FDA under an Emergency Use Authorization (EUA). This EUA will remain in effect (meaning this test can be used) for the duration of the COVID-19 declaration under Section 564(b)(1) of the Act, 21 U.S.C. section 360bbb-3(b)(1), unless the authorization is terminated or revoked.  Performed at [facility], [place] [address], [place], [place] [address]          Radiology Studies: DG Chest Portable 1 View  Result Date: 10/31/2020 CLINICAL DATA:  Chest pain and shortness of breath EXAM: PORTABLE CHEST 1 VIEW COMPARISON:  02/26/2020 FINDINGS: Heart size and pulmonary vascularity are normal. Bronchiectasis, peribronchial thickening, and coarse interstitial infiltrates in both lungs, similar to prior study. These likely represent chronic bronchitis and fibrosis. Emphysematous changes in the upper lungs. No definite focal infiltrates. No pleural effusions. No pneumothorax.  Mediastinal contours appear intact. IMPRESSION: Emphysematous changes, chronic bronchitic changes, and fibrosis in the lungs, similar to prior study. Electronically Signed   By: Lucienne Capers M.D.   On: 10/31/2020 03:03        Scheduled Meds:  clopidogrel  75 mg Oral Daily   enoxaparin (LOVENOX) injection  40 mg Subcutaneous Q24H   ipratropium-albuterol  3 mL Nebulization TID   loratadine  10 mg Oral Daily   nicotine  21 mg Transdermal Daily   predniSONE  40 mg Oral Q breakfast   rosuvastatin  20 mg Oral Daily   sodium chloride flush  3 mL Intravenous Q12H   umeclidinium-vilanterol  1 puff Inhalation Daily   Continuous Infusions:  sodium chloride       LOS: 1 day    Time spent:     Elmarie Shiley, MD [facility]   If 7PM-7AM, please  contact night-coverage [email or web address]  11/01/2020, 8:05 AM

## 2020-11-01 NOTE — Progress Notes (Signed)
RN called CT tech with confirmation of CT scan at [facility]. Solu-cortef given at this time , will reschedule benadryl for 1830PM. POC updated with patient.

## 2020-11-01 NOTE — Plan of Care (Signed)
  Problem: Education: Goal: Knowledge of General Education information will improve Description Including pain rating scale, medication(s)/side effects and non-pharmacologic comfort measures Outcome: Progressing   

## 2020-11-01 NOTE — Consult Note (Signed)
Cardiology Consultation:   Patient ID: PARISH DUBOSE Sr. MRN: [id number]; DOB: 02-14-1951  Admit date: 10/31/2020 Date of Consult: 11/01/2020  PCP:  Billie Ruddy, MD   [facility] Providers Cardiologist:  Gwenlyn Found      Patient Profile:   [name] is a 70 y.o. male with a hx of CAD, PAD, COPD, HTN,and tobacco use  who is being seen 11/01/2020 for the evaluation of CP  at the request of Dr. Frederic Jericho  History of Present Illness:   Mr. Zaremba is a 70 yo with hx of CAD who was followed by [facility] in the past.    In early 2001 had SOB   Work up went to L heart  Cath which showed LAD OK  LCx 100% mid with L to L collaterals.   IN 2005 pt had R jaw pain, SOB   Repeat cath showed: LAD OK  LCx 100%  3rd OM 100%  Filled retrograde (unchanged from 2001)   RCA 90% proximal, s/p TAXUS stent to this).   LVEF normal     The pt also has a hx of PAD with multiple interventions (s/p PTA   R iliac 2001; subsequent occlusion R commo iliac; underwent stenting in 2015; s/p PTA/stent L common iliac in 2019;  R with reocclusion, s/p R iliac stent)  The pt alos has a hx of Lung nodule, COPD, Tobacco abuse  The pt was last seen by Adora Fridge in clinic in April 2021 He was subsequently seen by [facility] for BP up until Sepember 2021  DId not toleated amlodipine ore HCTZ   on spironolactone   The pt is followed by C Young in Pulmonary   Seen in April    Was given Rx for nebulizer   Did not start using untilapprox 1 month ago      The pt and his wife say he has noted increased SOB esp for past 1 to 2 wks.   2 wks ago he could mow the lawn  Now giving out with minimal activity   Yesterday the pt's wife said that he didn't look good   Weak Pale   Pt said he felt chest tight, like elephant   SOB     In ed pt was tachypneic, hypoxix with wheezes   Rxx with DuoNeb, Solu Medrol with improvement   Currently on 1-2 L  Brathing is OK in bed   No CP       Past Medical History:  Diagnosis Date   COPD (chronic  obstructive pulmonary disease) ([place])    Coronary artery disease    a. s/p RCA stenting back in 2005 with a known chronically occluded circumflex and normal LV function.   Emphysema lung (HCC)    GERD (gastroesophageal reflux disease)    Heart attack ([place]) <2005   History of tobacco abuse    Hyperlipidemia    Hypertension    Lung nodule    a. Suspicious for malignancy, undergoing workup.   Obesity    Peripheral arterial disease ([place])    a. history of stent to right common iliac artery 12/15/99 with a peak for balloon-expandable stent. b. s/p intervention on RCIA total occlusion PTA and stent, residual disease on the right for possible staged intervention, notable disease on the left but asymptomatic in 2015. c. 10/2016: s/p PTA and stenting of left common iliac    Past Surgical History:  Procedure Laterality Date   CARDIAC CATHETERIZATION  10/26/1999   Archie Endo  09/13/2010   Cardiolite study     59% ejection fraction and negative for ischemia   CORONARY ANGIOPLASTY WITH STENT PLACEMENT  2005   Taxus stent placed to his RCA    HERNIA REPAIR     ILIAC ARTERY STENT Right 12/15/1999   a. history of stent to right common iliac artery 12/15/99 with a peak for balloon-expandable stent. b. s/p intervention on RCIA total occlusion PTA and stent, residual disease on the right for possible staged intervention, notable disease on the left but asymptomatic.   ILIAC VEIN ANGIOPLASTY / STENTING Right 03/16/2014   rt common iliac    by dr berry   LAPAROSCOPIC CHOLECYSTECTOMY  1990s   LOWER EXTREMITY ANGIOGRAM N/A 03/16/2014   Procedure: LOWER EXTREMITY ANGIOGRAM;  Surgeon: [name], MD;  Location: [facility] CATH LAB;  Service: Cardiovascular;  Laterality: N/A;   LOWER EXTREMITY ANGIOGRAPHY N/A 10/30/2016   Procedure: Lower Extremity Angiography;  Surgeon: [name], MD;  Location: [place] CV LAB;  Service: Cardiovascular;  Laterality: N/A;   LOWER EXTREMITY ANGIOGRAPHY N/A 11/30/2016    Procedure: Lower Extremity Angiography;  Surgeon: [name], MD;  Location: [place] CV LAB;  Service: Cardiovascular;  Laterality: N/A;   PERIPHERAL VASCULAR INTERVENTION Left 10/30/2016   Procedure: Peripheral Vascular Intervention;  Surgeon: [name], MD;  Location: [place] CV LAB;  Service: Cardiovascular;  Laterality: Left;  Lt Com ILIAC   TONSILLECTOMY AND ADENOIDECTOMY  4315Q   UMBILICAL HERNIA REPAIR  2012     Home Medications:  Prior to Admission medications   Medication Sig Start Date End Date Taking? Authorizing Provider  albuterol (VENTOLIN HFA) 108 (90 Base) MCG/ACT inhaler Inhale 2 puffs into the lungs every 6 (six) hours as needed for wheezing or shortness of breath. 02/12/20  Yes Young, Tarri Fuller D, MD  clopidogrel (PLAVIX) 75 MG tablet TAKE 1 TABLET(75 MG) BY MOUTH DAILY Patient taking differently: Take 75 mg by mouth daily. 08/27/20  Yes [name], MD  Coenzyme Q10 (COQ10) 100 MG CAPS Take 100 mg by mouth daily.   Yes [provider]  fexofenadine (ALLEGRA) 180 MG tablet Take 180 mg by mouth daily.    Yes [provider]  fluticasone (FLONASE) 50 MCG/ACT nasal spray Place 1 spray into both nostrils daily. 10/13/19  Yes Young, Tarri Fuller D, MD  Fluticasone-Umeclidin-Vilant (TRELEGY ELLIPTA) 100-62.5-25 MCG/INH AEPB Inhale 1 puff into the lungs daily. 08/27/20  Yes Young, Clinton D, MD  ipratropium-albuterol (DUONEB) 0.5-2.5 (3) MG/3ML SOLN Take 3 mLs by nebulization every 6 (six) hours as needed. Patient taking differently: Take 3 mLs by nebulization every 6 (six) hours as needed (shortness of breath). 08/06/20  Yes Young, Tarri Fuller D, MD  phenylephrine (SUDAFED PE) 10 MG TABS tablet Take 10 mg by mouth every 6 (six) hours as needed (congestion).   Yes [provider]  rosuvastatin (CRESTOR) 20 MG tablet TAKE 1 TABLET(20 MG) BY MOUTH DAILY Patient taking differently: Take 20 mg by mouth daily. 10/07/19  Yes [name], MD   spironolactone (ALDACTONE) 25 MG tablet TAKE 1 TABLET(25 MG) BY MOUTH DAILY Patient taking differently: Take 25 mg by mouth daily. 04/02/20  Yes [name], MD  triamcinolone (NASACORT) 55 MCG/ACT AERO nasal inhaler Place 2 sprays into the nose daily as needed (allergies).   Yes [provider]    Inpatient Medications: Scheduled Meds:  clopidogrel  75 mg Oral Daily   enoxaparin (LOVENOX) injection  40 mg Subcutaneous Q24H  guaiFENesin  1,200 mg Oral BID   ipratropium-albuterol  3 mL Nebulization Q4H   loratadine  10 mg Oral Daily   methylPREDNISolone (SOLU-MEDROL) injection  60 mg Intravenous Q8H   nicotine  21 mg Transdermal Daily   rosuvastatin  20 mg Oral Daily   sodium chloride flush  3 mL Intravenous Q12H   umeclidinium-vilanterol  1 puff Inhalation Daily   Continuous Infusions:  sodium chloride     PRN Meds: sodium chloride, acetaminophen **OR** acetaminophen, albuterol, polyethylene glycol, sodium chloride flush  Allergies:    Allergies  Allergen Reactions   Amlodipine Cough   Aspirin Other (See Comments)    Sneezes, watery nose, wheeze (no polyps)    Contrast Media [Iodinated Diagnostic Agents]     Need to be pre-medicated, sneezing, watery eyes   Erythromycin Hives    Childhood allergy All mycin drugs    Losartan Potassium Cough   Penicillins     Childhood allergy Has patient had a PCN reaction causing immediate rash, facial/tongue/throat swelling, SOB or lightheadedness with hypotension: Unknown Has patient had a PCN reaction causing severe rash involving mucus membranes or skin necrosis: Unknown Has patient had a PCN reaction that required hospitalization: No Has patient had a PCN reaction occurring within the last 10 years: No States he has had benadryl when taking this and had no problems recently     Simvastatin Other (See Comments)    Hip pain   Sulfa Antibiotics     unknown    Social History:   Social History   Socioeconomic  History   Marital status: Married    Spouse name: Olin Hauser   Number of children: 2   Years of education: Not on file   Highest education level: Not on file  Occupational History   Occupation: textile manf  Tobacco Use   Smoking status: Every Day    Packs/day: 2.00    Years: 50.00    Pack years: 100.00    Types: Cigarettes   Smokeless tobacco: Never   Tobacco comments:    currently smoking 1ppd as of 4/8  Vaping Use   Vaping Use: Never used  Substance and Sexual Activity   Alcohol use: Yes    Comment: 10/30/2016 "might have a few drinks 4-5 times/year"   Drug use: No   Sexual activity: Not Currently  Other Topics Concern   Not on file  Social History Narrative   Not on file   Social Determinants of Health   Financial Resource Strain: Not on file  Food Insecurity: Not on file  Transportation Needs: Not on file  Physical Activity: Not on file  Stress: Not on file  Social Connections: Not on file  Intimate Partner Violence: Not on file    Family History:    Family History  Problem Relation Age of Onset   Heart disease Father    Asthma Son    Lung cancer Paternal Grandfather    CAD Brother    CAD Brother      ROS:  Please see the history of present illness.   All other ROS reviewed and negative.     Physical Exam/Data:   Vitals:   10/31/20 2001 10/31/20 2348 11/01/20 0444 11/01/20 0845  BP: (!) 121/107 131/90 119/73   Pulse: 90 80 90   Resp: (!) 22 (!) 22 20   Temp: 98.4 F (36.9 C) 98.5 F (36.9 C) 98 F (36.7 C)   TempSrc: Oral Oral Oral   SpO2: 94% 95% 97% 98%  Weight:      Height:        Intake/Output Summary (Last 24 hours) at 11/01/2020 1107 Last data filed at 11/01/2020 0811 Gross per 24 hour  Intake 360 ml  Output --  Net 360 ml   Last 3 Weights 10/31/2020 08/06/2020 02/12/2020  Weight (lbs) 194 lb 194 lb 12.8 oz 192 lb  Weight (kg) 87.998 kg 88.361 kg 87.091 kg     Body mass index is 27.84 kg/m.  General:  Well nourished, well developed, in  no acute distress HEENT: normal Lymph: no adenopathy Neck: no JVD Endocrine:  No thryomegaly Vascular: No carotid bruits;  2+ R PT   1+ LPT Cardiac:  normal S1, S2; RRR; no murmur  Lungs: Decreased airflow   Wheezes   Rales  Rhonchi bilaterally   Abd: soft, nontender, no hepatomegaly  Ext: no edema Musculoskeletal:  No deformities, BUE and BLE strength normal and equal Skin: warm and dry  Neuro:  CNs 2-12 intact, no focal abnormalities noted Psych:  Normal affect   EKG:  The EKG was personally reviewed and demonstrates:  NSR 78 bpm  RA enlargment.  Nonspecific ST changes    Telemetry:  Telemetry was personally reviewed and demonstrates:  SR    Relevant CV Studies: None    Laboratory Data:  High Sensitivity Troponin:   Recent Labs  Lab 10/31/20 0224 10/31/20 0444 10/31/20 1119 10/31/20 1702  TROPONINIHS 42* 44* 35* 34*     Chemistry Recent Labs  Lab 10/31/20 0224 10/31/20 1018 11/01/20 0022  NA 136 139 139  K 4.6 4.3 4.3  CL 103  --  105  CO2 23  --  27  GLUCOSE 148*  --  131*  BUN 17  --  25*  CREATININE 0.88  --  1.10  CALCIUM 8.7*  --  9.1  GFRNONAA >60  --  >60  ANIONGAP 10  --  7    No results for input(s): PROT, ALBUMIN, AST, ALT, ALKPHOS, BILITOT in the last 168 hours. Hematology Recent Labs  Lab 10/31/20 0224 10/31/20 1018  WBC 14.6*  --   RBC 4.80  --   HGB 16.7 17.0  HCT 49.4 50.0  MCV 102.9*  --   MCH 34.8*  --   MCHC 33.8  --   RDW 13.0  --   PLT 163  --    BNPNo results for input(s): BNP, PROBNP in the last 168 hours.  DDimer No results for input(s): DDIMER in the last 168 hours.   Radiology/Studies:  DG Chest Portable 1 View  Result Date: 10/31/2020 CLINICAL DATA:  Chest pain and shortness of breath EXAM: PORTABLE CHEST 1 VIEW COMPARISON:  02/26/2020 FINDINGS: Heart size and pulmonary vascularity are normal. Bronchiectasis, peribronchial thickening, and coarse interstitial infiltrates in both lungs, similar to prior study. These  likely represent chronic bronchitis and fibrosis. Emphysematous changes in the upper lungs. No definite focal infiltrates. No pleural effusions. No pneumothorax. Mediastinal contours appear intact. IMPRESSION: Emphysematous changes, chronic bronchitic changes, and fibrosis in the lungs, similar to prior study. Electronically Signed   By: Lucienne Capers M.D.   On: 10/31/2020 03:03     Assessment and Plan:   1  Dyspnea / Chest tightness.   PT with long hx of tobacco abuse  Presents with increased SOB /chest tightness, worse over the past couple of weeks and esp on day of admitt   In ED was tachypneic   Today on exam, lungs still sound tight with  wheezes, decreased airflow   Patient being treated for COPD flare with nebs / steroids    COPD flare may explain most,  if not all of symtpoms   With known CAD (chonically occluded LCx) it is not unexpected to have angina if hypoxic Trivial elevation of tropcould be explained by above   I would follow medically for now    Rx as doing   If pulmonary status improves, moving air better and still has symtpoms could consider L heart cath   I would not schedule now     2  CAD  Signif CAD   with stent to RCA; occluded LCx  L heart cath was 2005   Troponin minimally elevated    I am not convinced ischemi is driving clinical picture but cannot exclude that it has not gotten worse    Rx pulmonary and follow symptoms   3  Tob  Counselled on cessation  4  PAD   Followed by Adora Fridge  Mult interventions   Just had scans   Stable mod dz    5  HL   Keep on Crestor          For questions or updates, please contact [place] Please consult [email or web address] for contact info under    Signed, Dorris Carnes, MD  11/01/2020 11:07 AM

## 2020-11-02 ENCOUNTER — Inpatient Hospital Stay (HOSPITAL_COMMUNITY): Payer: Medicare Other

## 2020-11-02 DIAGNOSIS — R079 Chest pain, unspecified: Secondary | ICD-10-CM

## 2020-11-02 DIAGNOSIS — J441 Chronic obstructive pulmonary disease with (acute) exacerbation: Secondary | ICD-10-CM | POA: Diagnosis not present

## 2020-11-02 LAB — ECHOCARDIOGRAM COMPLETE
AR max vel: 1.37 cm2
AV Area VTI: 1.43 cm2
AV Area mean vel: 1.39 cm2
AV Mean grad: 11 mmHg
AV Peak grad: 21.3 mmHg
Ao pk vel: 2.31 m/s
Area-P 1/2: 6.71 cm2
Height: 70 in
S' Lateral: 4.1 cm
Weight: 3104 oz

## 2020-11-02 LAB — CBC
HCT: 46.9 % (ref 39.0–52.0)
Hemoglobin: 15.9 g/dL (ref 13.0–17.0)
MCH: 34.6 pg — ABNORMAL HIGH (ref 26.0–34.0)
MCHC: 33.9 g/dL (ref 30.0–36.0)
MCV: 102 fL — ABNORMAL HIGH (ref 80.0–100.0)
Platelets: 161 10*3/uL (ref 150–400)
RBC: 4.6 MIL/uL (ref 4.22–5.81)
RDW: 12.9 % (ref 11.5–15.5)
WBC: 13.1 10*3/uL — ABNORMAL HIGH (ref 4.0–10.5)
nRBC: 0 % (ref 0.0–0.2)

## 2020-11-02 LAB — BASIC METABOLIC PANEL
Anion gap: 9 (ref 5–15)
BUN: 20 mg/dL (ref 8–23)
CO2: 28 mmol/L (ref 22–32)
Calcium: 8.7 mg/dL — ABNORMAL LOW (ref 8.9–10.3)
Chloride: 102 mmol/L (ref 98–111)
Creatinine, Ser: 0.8 mg/dL (ref 0.61–1.24)
GFR, Estimated: >60 mL/min (ref >60)
Glucose, Bld: 148 mg/dL — ABNORMAL HIGH (ref 70–99)
Potassium: 4.6 mmol/L (ref 3.5–5.1)
Sodium: 139 mmol/L (ref 135–145)

## 2020-11-02 LAB — VITAMIN B12: Vitamin B-12: 320 pg/mL (ref 180–914)

## 2020-11-02 LAB — FOLATE: Folate: 7.8 ng/mL (ref >5.9)

## 2020-11-02 MED ORDER — VITAMIN B-12 100 MCG PO TABS
100.0000 ug | ORAL_TABLET | Freq: Every day | ORAL | Status: DC
  Administered 2020-11-02 – 2020-11-09 (×8): 100 ug via ORAL
  Filled 2020-11-02 (×8): qty 1

## 2020-11-02 MED ORDER — DOXYCYCLINE HYCLATE 100 MG PO TABS
100.0000 mg | ORAL_TABLET | Freq: Two times a day (BID) | ORAL | Status: DC
  Administered 2020-11-02 – 2020-11-03 (×4): 100 mg via ORAL
  Filled 2020-11-02 (×4): qty 1

## 2020-11-02 MED ORDER — SERTRALINE HCL 50 MG PO TABS
50.0000 mg | ORAL_TABLET | Freq: Every day | ORAL | Status: DC
  Administered 2020-11-02 – 2020-11-09 (×8): 50 mg via ORAL
  Filled 2020-11-02 (×8): qty 1

## 2020-11-02 NOTE — Progress Notes (Signed)
Patient slept well overnight, denies any pain. No further SVTs after last night. Has flushed skin on his face and arms, he said this is his normal color especially in the mornings, denies SOB.

## 2020-11-02 NOTE — Progress Notes (Signed)
RN messaged MD and notified her pt HR keeps jumping from 80's to 130's highest being 150's non sustained looks like afbib pt just laying in bed watching TV.

## 2020-11-02 NOTE — Progress Notes (Signed)
Patient had an episode of non sustained SVT HR 150s, then down to 70s, asymptomatic, denies CP or SOB. Patient was asleep when it happened. His Spo2 was down to 86% last night when he started sleeping, placed on 2L now its 94-97%. Informed MD, awaiting further orders.

## 2020-11-02 NOTE — Progress Notes (Addendum)
PROGRESS NOTE    JESTON JUNKINS Sr.  CHY:[id number] DOB: 1950/12/20 DOA: 10/31/2020 PCP: Billie Ruddy, MD   Brief Narrative: 70 year old with past medical history significant for tobacco abuse, COPD, seasonal allergies, PVD/CAD who presents with 1 or 2 weeks progressive worsening shortness of breath.  He also report chest tightness. Patient was admitted for COPD exacerbation.   Assessment & Plan:   Principal Problem:   COPD with acute exacerbation ([place]) Active Problems:   Coronary artery disease   Tobacco abuse   Essential hypertension   PVD (peripheral vascular disease) (HCC)   1-Acute COPS exacerbation. Acute Hypercapnic Respiratory failure. Hypoxic.  -patient presents with congestion, thick secretion, SOB. ABG PCO2 52, compensated.  -Advise to stop smoking.  -CT angio negative for PE>  -Change nebulizer to every 4 hours.  -Continue with IV solumedrol.  -Continue with Guaifenesin. Flutter valve  -Continue with Loratadine, Anoro/ellipta.  -off oxygen, but required oxygen overnight.  -Started Doxy, allergies to Azithromycin.   2-CAD/PVD Chest tightness; Dyspnea on exertion.  EKG with PVC. Mild elevation of troponin.  Had presumably ECHO done last month at his cardiologist office.  Cardiology consulted.  Continue with Plavix and statins.  Plan to treat for COPD, and reassess   3-Depression;  Per wife he has been depress and knowing he needs to stop smoking he will get more depress.  Psych consulted. Plan to start him on Zoloft.  Continue with nicotine patch.   4-Check Vitamin D; per wife request. Vitamin D norma.  5-Macrocytosis: B 12  320. Started B 12 supplement.     Estimated body mass index is 27.84 kg/m as calculated from the following:   Height as of this encounter: 5\' 10"  (1.778 m).   Weight as of this encounter: 88 kg.   DVT prophylaxis: Lovenox Code Status: Full Code Family Communication: Care discuss with wife 7/04 Disposition Plan:  Status  is: Inpatient  Remains inpatient appropriate because:IV treatments appropriate due to intensity of illness or inability to take PO  Dispo: The patient is from: Home              Anticipated d/c is to: Home              Patient currently is not medically stable to d/c.   Difficult to place patient No        Consultants:  Cardiology   Procedures:  None  Antimicrobials:    Subjective: He is breathing better, denies chest pain.  He report Dyspnea on exertion still/    Objective: Vitals:   11/02/20 0522 11/02/20 0801 11/02/20 0829 11/02/20 1246  BP: 130/81   112/87  Pulse: 67     Resp: 20 16  18   Temp: 97.8 F (36.6 C)   98.4 F (36.9 C)  TempSrc: Oral   Oral  SpO2: 97%  92% 92%  Weight:      Height:        Intake/Output Summary (Last 24 hours) at 11/02/2020 1430 Last data filed at 11/01/2020 2307 Gross per 24 hour  Intake 360 ml  Output --  Net 360 ml    Filed Weights   10/31/20 0229  Weight: 88 kg    Examination:  General exam: NAD Respiratory system: BL expiratory wheezing, decrease breath sounds.  Cardiovascular system: S 1, S 2 RRR Gastrointestinal system: BS present, soft, nt Central nervous system: Alert Extremities: Symmetric power   Data Reviewed: I have personally reviewed following labs and imaging studies  CBC: Recent  Labs  Lab 10/31/20 0224 10/31/20 1018 11/02/20 0334  WBC 14.6*  --  13.1*  HGB 16.7 17.0 15.9  HCT 49.4 50.0 46.9  MCV 102.9*  --  102.0*  PLT 163  --  086    Basic Metabolic Panel: Recent Labs  Lab 10/31/20 0224 10/31/20 1018 11/01/20 0022 11/02/20 0334  NA 136 139 139 139  K 4.6 4.3 4.3 4.6  CL 103  --  105 102  CO2 23  --  27 28  GLUCOSE 148*  --  131* 148*  BUN 17  --  25* 20  CREATININE 0.88  --  1.10 0.80  CALCIUM 8.7*  --  9.1 8.7*  MG 2.0  --   --   --     GFR: Estimated Creatinine Clearance: 97.4 mL/min (by C-G formula based on SCr of 0.8 mg/dL). Liver Function Tests: No results for  input(s): AST, ALT, ALKPHOS, BILITOT, PROT, ALBUMIN in the last 168 hours. No results for input(s): LIPASE, AMYLASE in the last 168 hours. No results for input(s): AMMONIA in the last 168 hours. Coagulation Profile: No results for input(s): INR, PROTIME in the last 168 hours. Cardiac Enzymes: No results for input(s): CKTOTAL, CKMB, CKMBINDEX, TROPONINI in the last 168 hours. BNP (last 3 results) No results for input(s): PROBNP in the last 8760 hours. HbA1C: No results for input(s): HGBA1C in the last 72 hours. CBG: No results for input(s): GLUCAP in the last 168 hours. Lipid Profile: No results for input(s): CHOL, HDL, LDLCALC, TRIG, CHOLHDL, LDLDIRECT in the last 72 hours. Thyroid Function Tests: No results for input(s): TSH, T4TOTAL, FREET4, T3FREE, THYROIDAB in the last 72 hours. Anemia Panel: Recent Labs    11/02/20 0721  VITAMINB12 320  FOLATE 7.8   Sepsis Labs: No results for input(s): PROCALCITON, LATICACIDVEN in the last 168 hours.  Recent Results (from the past 240 hour(s))  SARS CORONAVIRUS 2 (TAT 6-24 HRS) Nasopharyngeal Nasopharyngeal Swab     Status: None   Collection Time: 10/31/20  2:44 AM   Specimen: Nasopharyngeal Swab  Result Value Ref Range Status   SARS Coronavirus 2 NEGATIVE NEGATIVE Final    Comment: (NOTE) SARS-CoV-2 target nucleic acids are NOT DETECTED.  The SARS-CoV-2 RNA is generally detectable in upper and lower respiratory specimens during the acute phase of infection. Negative results do not preclude SARS-CoV-2 infection, do not rule out co-infections with other pathogens, and should not be used as the sole basis for treatment or other patient management decisions. Negative results must be combined with clinical observations, patient history, and epidemiological information. The expected result is Negative.  Fact Sheet for Patients: SugarRoll.be  Fact Sheet for Healthcare  Providers: https://www.woods-mathews.com/  This test is not yet approved or cleared by the [place] FDA and  has been authorized for detection and/or diagnosis of SARS-CoV-2 by FDA under an Emergency Use Authorization (EUA). This EUA will remain  in effect (meaning this test can be used) for the duration of the COVID-19 declaration under Se ction 564(b)(1) of the Act, 21 U.S.C. section 360bbb-3(b)(1), unless the authorization is terminated or revoked sooner.  Performed at [facility], [place] [address] [place], [place] [address]   Resp Panel by RT-PCR (Flu A&B, Covid) Nasopharyngeal Swab     Status: None   Collection Time: 10/31/20 10:32 AM   Specimen: Nasopharyngeal Swab; Nasopharyngeal(NP) swabs in vial transport medium  Result Value Ref Range Status   SARS Coronavirus 2 by RT PCR NEGATIVE NEGATIVE Final  Comment: (NOTE) SARS-CoV-2 target nucleic acids are NOT DETECTED.  The SARS-CoV-2 RNA is generally detectable in upper respiratory specimens during the acute phase of infection. The lowest concentration of SARS-CoV-2 viral copies this assay can detect is 138 copies/mL. A negative result does not preclude SARS-Cov-2 infection and should not be used as the sole basis for treatment or other patient management decisions. A negative result may occur with  improper specimen collection/handling, submission of specimen other than nasopharyngeal swab, presence of viral mutation(s) within the areas targeted by this assay, and inadequate number of viral copies(<138 copies/mL). A negative result must be combined with clinical observations, patient history, and epidemiological information. The expected result is Negative.  Fact Sheet for Patients:  EntrepreneurPulse.com.au  Fact Sheet for Healthcare Providers:  IncredibleEmployment.be  This test is no t yet approved or cleared by the [place] FDA and  has been authorized  for detection and/or diagnosis of SARS-CoV-2 by FDA under an Emergency Use Authorization (EUA). This EUA will remain  in effect (meaning this test can be used) for the duration of the COVID-19 declaration under Section 564(b)(1) of the Act, 21 U.S.C.section 360bbb-3(b)(1), unless the authorization is terminated  or revoked sooner.       Influenza A by PCR NEGATIVE NEGATIVE Final   Influenza B by PCR NEGATIVE NEGATIVE Final    Comment: (NOTE) The Xpert Xpress SARS-CoV-2/FLU/RSV plus assay is intended as an aid in the diagnosis of influenza from Nasopharyngeal swab specimens and should not be used as a sole basis for treatment. Nasal washings and aspirates are unacceptable for Xpert Xpress SARS-CoV-2/FLU/RSV testing.  Fact Sheet for Patients: EntrepreneurPulse.com.au  Fact Sheet for Healthcare Providers: IncredibleEmployment.be  This test is not yet approved or cleared by the [place] FDA and has been authorized for detection and/or diagnosis of SARS-CoV-2 by FDA under an Emergency Use Authorization (EUA). This EUA will remain in effect (meaning this test can be used) for the duration of the COVID-19 declaration under Section 564(b)(1) of the Act, 21 U.S.C. section 360bbb-3(b)(1), unless the authorization is terminated or revoked.  Performed at [facility], [place] [address] [place], [place] [address]           Radiology Studies: CT Angio Chest Pulmonary Embolism (PE) W or WO Contrast  Result Date: 11/01/2020 CLINICAL DATA:  Shortness of breath. EXAM: CT ANGIOGRAPHY CHEST WITH CONTRAST TECHNIQUE: Multidetector CT imaging of the chest was performed using the standard protocol during bolus administration of intravenous contrast. Multiplanar CT image reconstructions and MIPs were obtained to evaluate the vascular anatomy. CONTRAST:  40mL OMNIPAQUE IOHEXOL 350 MG/ML SOLN COMPARISON:  March 11, 2020. FINDINGS: Cardiovascular:  Satisfactory opacification of the pulmonary arteries to the segmental level. No evidence of pulmonary embolism. Normal heart size. No pericardial effusion. Coronary artery calcifications are noted. Mediastinum/Nodes: No enlarged mediastinal, hilar, or axillary lymph nodes. Thyroid gland, trachea, and esophagus demonstrate no significant findings. Lungs/Pleura: No pneumothorax or pleural effusion is noted. Emphysematous disease is noted throughout both lungs. No acute pulmonary disease is noted. Upper Abdomen: No acute abnormality. Musculoskeletal: No chest wall abnormality. No acute or significant osseous findings. Review of the MIP images confirms the above findings. IMPRESSION: No definite evidence of pulmonary embolus. Coronary artery calcifications are noted suggesting coronary artery disease. Aortic Atherosclerosis (ICD10-I70.0) and Emphysema (ICD10-J43.9). Electronically Signed   By: Marijo Conception M.D.   On: 11/01/2020 19:57        Scheduled Meds:  clopidogrel  75 mg Oral Daily  doxycycline  100 mg Oral Q12H   enoxaparin (LOVENOX) injection  40 mg Subcutaneous Q24H   guaiFENesin  1,200 mg Oral BID   ipratropium-albuterol  3 mL Nebulization TID   loratadine  10 mg Oral Daily   methylPREDNISolone (SOLU-MEDROL) injection  60 mg Intravenous Q8H   nicotine  21 mg Transdermal Daily   rosuvastatin  20 mg Oral Daily   sertraline  50 mg Oral Daily   sodium chloride flush  3 mL Intravenous Q12H   umeclidinium-vilanterol  1 puff Inhalation Daily   vitamin B-12  100 mcg Oral Daily   Continuous Infusions:  sodium chloride       LOS: 2 days    Time spent:     Elmarie Shiley, MD [facility]   If 7PM-7AM, please contact night-coverage [email or web address]  11/02/2020, 2:30 PM

## 2020-11-02 NOTE — Progress Notes (Addendum)
Progress Note  Patient Name: Brady GULLIKSON Sr. Date of Encounter: 11/02/2020  [facility] Cardiologist: [name], MD  Subjective   Denies any CP today, has been having some SOB and chest pain with exertion at home prior to arrival.  Inpatient Medications    Scheduled Meds:  clopidogrel  75 mg Oral Daily   enoxaparin (LOVENOX) injection  40 mg Subcutaneous Q24H   guaiFENesin  1,200 mg Oral BID   ipratropium-albuterol  3 mL Nebulization TID   loratadine  10 mg Oral Daily   methylPREDNISolone (SOLU-MEDROL) injection  60 mg Intravenous Q8H   nicotine  21 mg Transdermal Daily   rosuvastatin  20 mg Oral Daily   sodium chloride flush  3 mL Intravenous Q12H   umeclidinium-vilanterol  1 puff Inhalation Daily   Continuous Infusions:  sodium chloride     PRN Meds: sodium chloride, acetaminophen **OR** acetaminophen, albuterol, polyethylene glycol, sodium chloride flush   Vital Signs    Vitals:   11/01/20 2306 11/02/20 0522 11/02/20 0801 11/02/20 0829  BP: 112/67 130/81    Pulse: 78 67    Resp: 20 20 16    Temp: 98 F (36.7 C) 97.8 F (36.6 C)    TempSrc: Oral Oral    SpO2: 93% 97%  92%  Weight:      Height:        Intake/Output Summary (Last 24 hours) at 11/02/2020 0925 Last data filed at 11/01/2020 2307 Gross per 24 hour  Intake 720 ml  Output --  Net 720 ml   Last 3 Weights 10/31/2020 08/06/2020 02/12/2020  Weight (lbs) 194 lb 194 lb 12.8 oz 192 lb  Weight (kg) 87.998 kg 88.361 kg 87.091 kg      Telemetry    NSR with a single episode of SVT this morning.  - Personally Reviewed  ECG    NSR without significant ST-T wave changes - Personally Reviewed  Physical Exam   GEN: No acute distress.   Neck: No JVD Cardiac: RRR, no murmurs, rubs, or gallops.  Respiratory: markedly diminished breath sound bilaterally. GI: Soft, nontender, non-distended  MS: No edema; No deformity. Neuro:  Nonfocal  Psych: Normal affect   Labs    High Sensitivity  Troponin:   Recent Labs  Lab 10/31/20 0224 10/31/20 0444 10/31/20 1119 10/31/20 1702  TROPONINIHS 42* 44* 35* 34*      Chemistry Recent Labs  Lab 10/31/20 0224 10/31/20 1018 11/01/20 0022 11/02/20 0334  NA 136 139 139 139  K 4.6 4.3 4.3 4.6  CL 103  --  105 102  CO2 23  --  27 28  GLUCOSE 148*  --  131* 148*  BUN 17  --  25* 20  CREATININE 0.88  --  1.10 0.80  CALCIUM 8.7*  --  9.1 8.7*  GFRNONAA >60  --  >60 >60  ANIONGAP 10  --  7 9     Hematology Recent Labs  Lab 10/31/20 0224 10/31/20 1018 11/02/20 0334  WBC 14.6*  --  13.1*  RBC 4.80  --  4.60  HGB 16.7 17.0 15.9  HCT 49.4 50.0 46.9  MCV 102.9*  --  102.0*  MCH 34.8*  --  34.6*  MCHC 33.8  --  33.9  RDW 13.0  --  12.9  PLT 163  --  161    BNPNo results for input(s): BNP, PROBNP in the last 168 hours.   DDimer  Recent Labs  Lab 11/01/20 1138  DDIMER 0.85*  Radiology    CT Angio Chest Pulmonary Embolism (PE) W or WO Contrast  Result Date: 11/01/2020 CLINICAL DATA:  Shortness of breath. EXAM: CT ANGIOGRAPHY CHEST WITH CONTRAST TECHNIQUE: Multidetector CT imaging of the chest was performed using the standard protocol during bolus administration of intravenous contrast. Multiplanar CT image reconstructions and MIPs were obtained to evaluate the vascular anatomy. CONTRAST:  31mL OMNIPAQUE IOHEXOL 350 MG/ML SOLN COMPARISON:  March 11, 2020. FINDINGS: Cardiovascular: Satisfactory opacification of the pulmonary arteries to the segmental level. No evidence of pulmonary embolism. Normal heart size. No pericardial effusion. Coronary artery calcifications are noted. Mediastinum/Nodes: No enlarged mediastinal, hilar, or axillary lymph nodes. Thyroid gland, trachea, and esophagus demonstrate no significant findings. Lungs/Pleura: No pneumothorax or pleural effusion is noted. Emphysematous disease is noted throughout both lungs. No acute pulmonary disease is noted. Upper Abdomen: No acute abnormality.  Musculoskeletal: No chest wall abnormality. No acute or significant osseous findings. Review of the MIP images confirms the above findings. IMPRESSION: No definite evidence of pulmonary embolus. Coronary artery calcifications are noted suggesting coronary artery disease. Aortic Atherosclerosis (ICD10-I70.0) and Emphysema (ICD10-J43.9). Electronically Signed   By: Marijo Conception M.D.   On: 11/01/2020 19:57    Cardiac Studies   Cath 10/15/2003 CORONARY ARTERIOGRAPHY:  Calcification was noted in the proximal LAD on  fluoroscopy.    1. Left main normal.  2. LAD:  The left anterior descending crossed the apex of the heart and had     mild irregularities in the mid portion.  The first diagonal also had mild     irregularities.  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  3. Circumflex:  The circumflex had moderate irregularities in the mid     portion just before it became 100% occluded.  There were two very small     OM vessels that came off the proximal portion.  What appears to be a     relatively large third OM was totally occluded, but filled retrograde and     late via left-to-left collaterals (this is unchanged from 2001 cath).  4. Right coronary artery:  The right coronary artery is a dominant vessel     giving rise to PDA and posterior lateral branch.  The proximal portion of     the right coronary artery had an eccentric, but focal area of 90%     narrowing.  The mid and distal vessel were free of disease.    CONCLUSION:  1. 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  2. Mild irregularities in the left anterior descending.  3. High grade stenosis in the proximal right coronary artery.  4. Normal left ventricular function.  5. Apparent restenosis within the right iliac stent.    Because of the high grade stenosis in the right coronary artery,   arrangements were made for intervention.  The 5 French sheath was exchanged  out for 6 [place] sheath.  JR-4 guide catheter with side holes was used.  A  short Luge wire was placed down the distal right coronary artery and a 2.5 x  9 Maverick balloon was used for dilatation at 8 atmospheres x 45.  Two  inflations were performed.  Following this, a 3.0 x 16 Taxus stent was  placed in such a manner that both the proximal and distal portion was well  covered.  It was deployed initially at 13 atmospheres for 53 seconds.  The  final inflation at 15 atmospheres for 55 seconds.    The 90% area of the proximal right coronary artery before intervention is  now less than 10% narrowed.  There was no evidence of any dissection or  thrombus or distal embolization.  There was TIMI-3 flow pre and post  intervention.    The patient was maintained on heparin with therapeutic ACTs with final ACT  at the end of the procedure being 233.  Integrilin infusion was also  performed and will be carried out for additional 12 hours.    The patient was given 300 mg of oral Plavix and he should be ready for  discharge in the morning.  His LDL is 70 and the only untreated risk factor  is his continued cigarette abuse which I discussed in detail with the  patient.    Patient Profile     70 y.o. male with PMH of CAD, PAD, COPD, HTN and tobacco use presented with DOE ad chest pain  Assessment & Plan    Chest pain and DOE  - in the setting of COPD exacerbation  - per Dr. Harrington Challenger, will need treat pulmonary etiology first, if still symptomatic after that, may consider additional work up.  - Last cath in 2005, anginal symptom at the time was R jaw pain and DOE, never had chest pain in the past. Current symptom feels different from previous angina  - serial trop elevated, but flat, 42 --> 44 --> 35 --> 34  - order Echocardiogram. Unable to do myoview any way given contraindication of lexiscan in the setting of COPD  exacerbation. No urgent need to consider dobutamine stress test.   Acute COPD exacerbation: per primary team  SVT: single 14 beats run around 1:40AM in the morning of 7/5, in the setting of COPD exacerbation   CAD: last cath in 2005 revealed occluded LCx, stent to RCA.   Tobacco abuse: smoked for over 70 years, diagnosed with COPD previously but never required on home O2  PAD: on plavix  HLD: continue crestor  HTN      For questions or updates, please contact [place] Please consult [email or web address] for contact info under        Signed, Almyra Deforest, [place]  11/02/2020, 9:25 AM    Patient seen and examined and agree with Almyra Deforest, PA as detailed above.  In brief, the patient is a 70 y.o. male with a hx of CAD s/p RCA PCI and occluded Lcx in 2005, PAD, COPD, HTN,and tobacco use who presented with progressive SOB and chest pain found to have acute COPD exacerbation. Cardiology is consulted regarding chest pain.  Suspect primary driver of acute presentation is acute  COPD exacerbation. Trop flat. TTE pending. Unable to perform myoview due to acute COPD flare. Agree with managing acute COPD exacerbation and consider ischemic evaluation in the future if symptoms persist.  GEN: No acute distress.   Neck: No JVD Cardiac: RRR, no murmurs, rubs, or gallops.  Respiratory: Clear to auscultation bilaterally. GI: Soft, nontender, non-distended  MS: No edema; No deformity. Neuro:  Nonfocal  Psych: Normal affect    Plan: -Management of COPD exacerbation per primary team -If symptoms persist despite improvement in respiratory status, can consider repeat ischemic evaluation at that time -Follow-up TTE -Unable to perform myoview due to active COPD flare -Continue plavix 75mg  daily and crestor 20mg  daily for known CAD and PAD -No  BB due to active COPD flare  Gwyndolyn Kaufman, MD

## 2020-11-02 NOTE — Progress Notes (Addendum)
Patient came back from CTPA, tolerated well. Negative PE.

## 2020-11-02 NOTE — Consult Note (Signed)
[place] [facility] Consult   Reason for Consult:  Depression Referring Physician:  Niel Hummer A Patient Identification: Brady BURROUS Sr. MRN:  [id number] Principal Diagnosis: COPD with acute exacerbation ([place]) Diagnosis:  Principal Problem:   COPD with acute exacerbation ([place]) Active Problems:   Coronary artery disease   Tobacco abuse   Essential hypertension   PVD (peripheral vascular disease) ([place])   Total Time spent with patient: 20 minutes  Subjective:   Brady Crumb Sr. is a 70 y.o. male patient admitted with progressive shortness of breath.  HPI:   Pt is a 70 yo male w/ 100 pack year hx, COPD, seasonal allergies, PVD/CAD who was admitted due to progressive shortness of breath. Pt states he has been feeling depressed knowing that he has to quit smoking and that he is coming up on his retirement. 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 Patient does not want to feel "useless" after retiring and is concerned that he will have too much idle time. Pt enjoys lawnwork and fishing trips with his son.   Pt has had previous smoking cessation attempts (4 times) and longest period of time of smoking cessation was 1 year when he was 4. Pt felt he was able to quit then because he thought "why can't I do it when others can". Pt has previously attempted nicotine gum and welbutrin. Pt felt "jittery" while chewing gum and felt he was unable to focus while on welbutrin. Pt had tried welbutrin for 1 month before discontinuing.  Pt feels he will be able to quit smoking as long as he is able to "take the edge off" and feels that with nicotine patch and familial support will be able to quit smoking. Pt denies SI/HI/AVH.   Past Psychiatric History: None  Risk to Self: None  Risk to Others: None  Prior Inpatient Therapy: None  Prior Outpatient Therapy: None   Past Medical History:  Past Medical History:  Diagnosis Date   COPD (chronic obstructive pulmonary disease)  ([place])    Coronary artery disease    a. s/p RCA stenting back in 2005 with a known chronically occluded circumflex and normal LV function.   Emphysema lung (H[facility])    GERD (gastroesophageal reflux disease)    Heart attack ([place]) <2005   History of tobacco abuse    Hyperlipidemia    Hypertension    Lung nodule    a. Suspicious for malignancy, undergoing workup.   Obesity    Peripheral arterial disease ([place])    a. history of stent to right common iliac artery 12/15/99 with a peak for balloon-expandable stent. b. s/p intervention on RCIA total occlusion PTA and stent, residual disease on the right for possible staged intervention, notable disease on the left but asymptomatic in 2015. c. 10/2016: s/p PTA and stenting of left common iliac    Past Surgical History:  Procedure Laterality Date   CARDIAC CATHETERIZATION  10/26/1999   Archie Endo 09/13/2010   Cardiolite study     59% ejection fraction and negative for ischemia   CORONARY ANGIOPLASTY WITH STENT PLACEMENT  2005   Taxus stent placed to his RCA    Leslie Right 12/15/1999   a. history of stent to right common iliac artery 12/15/99 with a peak for balloon-expandable stent. b. s/p intervention on RCIA total occlusion PTA and stent, residual disease on the right for possible staged intervention, notable disease on the left but asymptomatic.  ILIAC VEIN ANGIOPLASTY / STENTING Right 03/16/2014   rt common iliac    by dr berry   LAPAROSCOPIC CHOLECYSTECTOMY  1990s   LOWER EXTREMITY ANGIOGRAM N/A 03/16/2014   Procedure: LOWER EXTREMITY ANGIOGRAM;  Surgeon: [name], MD;  Location: [facility] CATH LAB;  Service: Cardiovascular;  Laterality: N/A;   LOWER EXTREMITY ANGIOGRAPHY N/A 10/30/2016   Procedure: Lower Extremity Angiography;  Surgeon: [name], MD;  Location: [place] CV LAB;  Service: Cardiovascular;  Laterality: N/A;   LOWER EXTREMITY ANGIOGRAPHY N/A 11/30/2016   Procedure: Lower Extremity Angiography;   Surgeon: [name], MD;  Location: [place] CV LAB;  Service: Cardiovascular;  Laterality: N/A;   PERIPHERAL VASCULAR INTERVENTION Left 10/30/2016   Procedure: Peripheral Vascular Intervention;  Surgeon: [name], MD;  Location: [place] CV LAB;  Service: Cardiovascular;  Laterality: Left;  Lt Com ILIAC   TONSILLECTOMY AND ADENOIDECTOMY  7062B   UMBILICAL HERNIA REPAIR  2012   Family History:  Family History  Problem Relation Age of Onset   Heart disease Father    Asthma Son    Lung cancer Paternal Grandfather    CAD Brother    CAD Brother    Family Psychiatric  History: N/A Social History:  Social History   Substance and Sexual Activity  Alcohol Use Yes   Comment: 10/30/2016 "might have a few drinks 4-5 times/year"     Social History   Substance and Sexual Activity  Drug Use No    Social History   Socioeconomic History   Marital status: Married    Spouse name: Olin Hauser   Number of children: 2   Years of education: Not on file   Highest education level: Not on file  Occupational History   Occupation: textile manf  Tobacco Use   Smoking status: Every Day    Packs/day: 2.00    Years: 50.00    Pack years: 100.00    Types: Cigarettes   Smokeless tobacco: Never   Tobacco comments:    currently smoking 1ppd as of 4/8  Vaping Use   Vaping Use: Never used  Substance and Sexual Activity   Alcohol use: Yes    Comment: 10/30/2016 "might have a few drinks 4-5 times/year"   Drug use: No   Sexual activity: Not Currently  Other Topics Concern   Not on file  Social History Narrative   Not on file   Social Determinants of Health   Financial Resource Strain: Not on file  Food Insecurity: Not on file  Transportation Needs: Not on file  Physical Activity: Not on file  Stress: Not on file  Social Connections: Not on file   Additional Social History:    Allergies:   Allergies  Allergen Reactions   Amlodipine Cough   Aspirin Other (See Comments)     Sneezes, watery nose, wheeze (no polyps)    Contrast Media [Iodinated Diagnostic Agents]     Need to be pre-medicated, sneezing, watery eyes   Erythromycin Hives    Childhood allergy All mycin drugs    Losartan Potassium Cough   Penicillins     Childhood allergy Has patient had a PCN reaction causing immediate rash, facial/tongue/throat swelling, SOB or lightheadedness with hypotension: Unknown Has patient had a PCN reaction causing severe rash involving mucus membranes or skin necrosis: Unknown Has patient had a PCN reaction that required hospitalization: No Has patient had a PCN reaction occurring within the last 10 years: No States he has had benadryl when  taking this and had no problems recently     Simvastatin Other (See Comments)    Hip pain   Sulfa Antibiotics     unknown    Labs:  Results for orders placed or performed during the hospital encounter of 10/31/20 (from the past 48 hour(s))  Troponin I (High Sensitivity)     Status: Abnormal   Collection Time: 10/31/20  5:02 PM  Result Value Ref Range   Troponin I (High Sensitivity) 34 (H) <18 ng/L    Comment: (NOTE) Elevated high sensitivity troponin I (hsTnI) values and significant  changes across serial measurements may suggest ACS but many other  chronic and acute conditions are known to elevate hsTnI results.  Refer to the "Links" section for chest pain algorithms and additional  guidance. Performed at [facility], [place] [address], [place], [place] [address]   Basic metabolic panel     Status: Abnormal   Collection Time: 11/01/20 12:22 AM  Result Value Ref Range   Sodium 139 135 - 145 mmol/L   Potassium 4.3 3.5 - 5.1 mmol/L   Chloride 105 98 - 111 mmol/L   CO2 27 22 - 32 mmol/L   Glucose, Bld 131 (H) 70 - 99 mg/dL    Comment: Glucose reference range applies only to samples taken after fasting for at least 8 hours.   BUN 25 (H) 8 - 23 mg/dL   Creatinine, Ser 1.10 0.61 - 1.24 mg/dL   Calcium 9.1 8.9 - 10.3  mg/dL   GFR, Estimated >60 >60 mL/min    Comment: (NOTE) Calculated using the CKD-EPI Creatinine Equation (2021)    Anion gap 7 5 - 15    Comment: Performed at [facility] [address], [place], [place] [address]  VITAMIN D 25 Hydroxy (Vit-D Deficiency, Fractures)     Status: None   Collection Time: 11/01/20 10:44 AM  Result Value Ref Range   Vit D, 25-Hydroxy 40.27 30 - 100 ng/mL    Comment: (NOTE) Vitamin D deficiency has been defined by the [place] practice guideline as a level of serum 25-OH  vitamin D less than 20 ng/mL (1,2). The Endocrine Society went on to  further define vitamin D insufficiency as a level between 21 and 29  ng/mL (2).  1. IOM (Institute of Medicine). 2010. Dietary reference intakes for  calcium and D. [place]: The [employer]. 2. Holick MF, Binkley [place], Bischoff-Ferrari HA, et al. Evaluation,  treatment, and prevention of vitamin D deficiency: an Endocrine  Society clinical practice guideline, JCEM. 2011 Jul; 96(7): 1911-30.  Performed at [facility], [place] [address], [place], [place] [address]   D-dimer, quantitative     Status: Abnormal   Collection Time: 11/01/20 11:38 AM  Result Value Ref Range   D-Dimer, Quant 0.85 (H) 0.00 - 0.50 ug/mL-FEU    Comment: (NOTE) At the manufacturer cut-off value of 0.5 g/mL FEU, this assay has a negative predictive value of 95-100%.This assay is intended for use in conjunction with a clinical pretest probability (PTP) assessment model to exclude pulmonary embolism (PE) and deep venous thrombosis (DVT) in outpatients suspected of PE or DVT. Results should be correlated with clinical presentation. Performed at [facility], [place] [address], [place], [place] [address]   CBC     Status: Abnormal   Collection Time: 11/02/20  3:34 AM  Result Value Ref Range   WBC 13.1 (H) 4.0 -  10.5 K/uL   RBC 4.60 4.22 - 5.81 MIL/uL   Hemoglobin 15.9  13.0 - 17.0 g/dL   HCT 46.9 39.0 - 52.0 %   MCV 102.0 (H) 80.0 - 100.0 fL   MCH 34.6 (H) 26.0 - 34.0 pg   MCHC 33.9 30.0 - 36.0 g/dL   RDW 12.9 11.5 - 15.5 %   Platelets 161 150 - 400 K/uL   nRBC 0.0 0.0 - 0.2 %    Comment: Performed at [facility] [address], [place], [place] [address]  Basic metabolic panel     Status: Abnormal   Collection Time: 11/02/20  3:34 AM  Result Value Ref Range   Sodium 139 135 - 145 mmol/L   Potassium 4.6 3.5 - 5.1 mmol/L   Chloride 102 98 - 111 mmol/L   CO2 28 22 - 32 mmol/L   Glucose, Bld 148 (H) 70 - 99 mg/dL    Comment: Glucose reference range applies only to samples taken after fasting for at least 8 hours.   BUN 20 8 - 23 mg/dL   Creatinine, Ser 0.80 0.61 - 1.24 mg/dL   Calcium 8.7 (L) 8.9 - 10.3 mg/dL   GFR, Estimated >60 >60 mL/min    Comment: (NOTE) Calculated using the CKD-EPI Creatinine Equation (2021)    Anion gap 9 5 - 15    Comment: Performed at [facility] [address], [place], [place] [address]  Vitamin B12     Status: None   Collection Time: 11/02/20  7:21 AM  Result Value Ref Range   Vitamin B-12 320 180 - 914 pg/mL    Comment: (NOTE) This assay is not validated for testing neonatal or myeloproliferative syndrome specimens for Vitamin B12 levels. Performed at [facility], [place] [address] [place], [place] [address]   Folate, serum, performed at [facility] lab     Status: None   Collection Time: 11/02/20  7:21 AM  Result Value Ref Range   Folate 7.8 >5.9 ng/mL    Comment: Performed at [facility], [place] [address] [place], [place] [address]    Current Facility-Administered Medications  Medication Dose Route Frequency Provider Last Rate Last Admin   0.9 %  sodium chloride infusion  250 mL Intravenous PRN Vashti Hey, MD       acetaminophen (TYLENOL) tablet 650 mg  650 mg Oral Q6H PRN Vashti Hey, MD       Or   acetaminophen (TYLENOL) suppository 650 mg  650 mg  Rectal Q6H PRN Bonnell Public Tublu, MD       albuterol (PROVENTIL) (2.5 MG/3ML) 0.083% nebulizer solution 2.5 mg  2.5 mg Nebulization Q6H PRN Vashti Hey, MD       clopidogrel (PLAVIX) tablet 75 mg  75 mg Oral Daily Bonnell Public Tublu, MD   75 mg at 11/02/20 0825   doxycycline (VIBRA-TABS) tablet 100 mg  100 mg Oral Q12H Regalado, Belkys A, MD   100 mg at 11/02/20 1126   enoxaparin (LOVENOX) injection 40 mg  40 mg Subcutaneous Q24H Bonnell Public Tublu, MD   40 mg at 11/02/20 0824   guaiFENesin (MUCINEX) 12 hr tablet 1,200 mg  1,200 mg Oral BID Regalado, Belkys A, MD   1,200 mg at 11/02/20 0825   ipratropium-albuterol (DUONEB) 0.5-2.5 (3) MG/3ML nebulizer solution 3 mL  3 mL Nebulization TID Regalado, Belkys A, MD   3 mL at 11/02/20 0801   loratadine (CLARITIN) tablet 10  mg  10 mg Oral Daily Bonnell Public Tublu, MD   10 mg at 11/02/20 0825   methylPREDNISolone sodium succinate (SOLU-MEDROL) 125 mg/2 mL injection 60 mg  60 mg Intravenous Q8H Regalado, Belkys A, MD   60 mg at 11/02/20 1325   nicotine (NICODERM CQ - dosed in mg/24 hours) patch 21 mg  21 mg Transdermal Daily Bonnell Public Tublu, MD   21 mg at 11/02/20 0827   polyethylene glycol (MIRALAX / GLYCOLAX) packet 17 g  17 g Oral Daily PRN Bonnell Public Tublu, MD       rosuvastatin (CRESTOR) tablet 20 mg  20 mg Oral Daily Bonnell Public Tublu, MD   20 mg at 11/02/20 5035   sertraline (ZOLOFT) tablet 50 mg  50 mg Oral Daily Damita Dunnings B, MD   50 mg at 11/02/20 1324   sodium chloride flush (NS) 0.9 % injection 3 mL  3 mL Intravenous Q12H Bonnell Public Tublu, MD   3 mL at 11/02/20 0827   sodium chloride flush (NS) 0.9 % injection 3 mL  3 mL Intravenous PRN Vashti Hey, MD       umeclidinium-vilanterol (ANORO ELLIPTA) 62.5-25 MCG/INH 1 puff  1 puff Inhalation Daily Vashti Hey, MD   1 puff at 11/02/20 0801            Psychiatric Specialty  Exam:  Presentation  General Appearance:  Appropriate for Environment Eye Contact: Good Speech: Clear and Coherent Speech Volume: Normal Handedness: No data recorded  Mood and Affect  Mood: Depressed Affect: Appropriate  Thought Process  Thought Processes: Coherent Descriptions of Associations:Intact Orientation:Full (Time, Place and Person) Thought Content:Logical History of Schizophrenia/Schizoaffective disorder:No data recorded Duration of Psychotic Symptoms:No data recorded Hallucinations:Hallucinations: None Ideas of Reference:None Suicidal Thoughts:Suicidal Thoughts: No Homicidal Thoughts:Homicidal Thoughts: No  Sensorium  Memory: Immediate Good; Recent Good; Remote Good Judgment: Fair Insight: Fair  Community education officer  Concentration: Fair Attention Span: Fair Recall: [employer] of Knowledge: Fair Language: Fair  Psychomotor Activity  Psychomotor Activity: Psychomotor Activity: Normal  Assets  Assets: Desire for Improvement; Communication Skills; Housing; Social Support  Sleep  Sleep: Sleep: Good  Physical Exam: Physical Exam Vitals reviewed.  Constitutional:      Appearance: Normal appearance.  HENT:     Head: Normocephalic and atraumatic.  Eyes:     Extraocular Movements: Extraocular movements intact.  Cardiovascular:     Rate and Rhythm: Normal rate and regular rhythm.  Abdominal:     General: Abdomen is flat.     Palpations: Abdomen is soft.  Musculoskeletal:        General: Normal range of motion.     Cervical back: Normal range of motion.  Skin:    General: Skin is warm and dry.  Neurological:     General: No focal deficit present.     Mental Status: He is alert and oriented to person, place, and time.   Review of Systems  Constitutional:  Negative for chills and fever.  Respiratory:  Negative for cough and wheezing.   Cardiovascular:  Negative for chest pain and palpitations.  Gastrointestinal:  Negative for  abdominal pain, nausea and vomiting.  Skin:  Negative for itching and rash.  Neurological:  Negative for dizziness and headaches.  Blood pressure 112/87, pulse 67, temperature 98.4 F (36.9 C), temperature source Oral, resp. rate 18, height 5\' 10"  (1.778 m), weight 88 kg, SpO2 92 %. Body mass index is 27.84 kg/m.  Treatment Plan Summary: Pt is a 70 yo  male w/ 100 pack year smoking, COPD, seasonal allergies, PVD/CAD presented to hospital to progressive increase in shortness of breath. Patient is confident he will be able to permanently quit but does feel depressed about it as he has used smoking as a crutch when he is stressed. Pt feels that nicotine patch has been helpful to "take the edge off"  MDD -Zoloft 50 mg for depression -Continue nicotine patch for smoking cessation.  -Will follow while adjusting medication  Disposition: No evidence of imminent risk to self or others at present.    Thank you for the consult, France Ravens, MD PGY1 Psychiatry Resident 11/02/2020 2:29 PM

## 2020-11-02 NOTE — Progress Notes (Signed)
RN paged cardiology and notified Dr. Johney Frame about pt HR.

## 2020-11-03 DIAGNOSIS — J441 Chronic obstructive pulmonary disease with (acute) exacerbation: Secondary | ICD-10-CM | POA: Diagnosis not present

## 2020-11-03 LAB — CBC
HCT: 47.4 % (ref 39.0–52.0)
Hemoglobin: 16.3 g/dL (ref 13.0–17.0)
MCH: 34.5 pg — ABNORMAL HIGH (ref 26.0–34.0)
MCHC: 34.4 g/dL (ref 30.0–36.0)
MCV: 100.2 fL — ABNORMAL HIGH (ref 80.0–100.0)
Platelets: 152 10*3/uL (ref 150–400)
RBC: 4.73 MIL/uL (ref 4.22–5.81)
RDW: 12.5 % (ref 11.5–15.5)
WBC: 16.3 10*3/uL — ABNORMAL HIGH (ref 4.0–10.5)
nRBC: 0 % (ref 0.0–0.2)

## 2020-11-03 LAB — BASIC METABOLIC PANEL
Anion gap: 6 (ref 5–15)
BUN: 22 mg/dL (ref 8–23)
CO2: 26 mmol/L (ref 22–32)
Calcium: 8.6 mg/dL — ABNORMAL LOW (ref 8.9–10.3)
Chloride: 106 mmol/L (ref 98–111)
Creatinine, Ser: 0.79 mg/dL (ref 0.61–1.24)
GFR, Estimated: >60 mL/min (ref >60)
Glucose, Bld: 184 mg/dL — ABNORMAL HIGH (ref 70–99)
Potassium: 4.5 mmol/L (ref 3.5–5.1)
Sodium: 138 mmol/L (ref 135–145)

## 2020-11-03 MED ORDER — APIXABAN 5 MG PO TABS
5.0000 mg | ORAL_TABLET | Freq: Two times a day (BID) | ORAL | Status: DC
  Administered 2020-11-03 – 2020-11-06 (×7): 5 mg via ORAL
  Filled 2020-11-03 (×7): qty 1

## 2020-11-03 MED ORDER — ISOSORBIDE MONONITRATE ER 30 MG PO TB24
30.0000 mg | ORAL_TABLET | Freq: Every day | ORAL | Status: DC
  Administered 2020-11-03 – 2020-11-09 (×7): 30 mg via ORAL
  Filled 2020-11-03 (×8): qty 1

## 2020-11-03 MED ORDER — DILTIAZEM HCL 60 MG PO TABS
30.0000 mg | ORAL_TABLET | Freq: Three times a day (TID) | ORAL | Status: DC
  Administered 2020-11-03 (×2): 30 mg via ORAL
  Filled 2020-11-03 (×2): qty 1

## 2020-11-03 NOTE — Progress Notes (Signed)
Provider paged and made aware pt had an episode of SVT lasting approximately 2 minutes.  HR ranging between 130-160bpm. Pt was resting in bed, asymptomatic and denied chest pain or any other symptoms. Pt HR has returned spontaneously back to NSR at 80 bpm. No new orders given. Pt remains on cardiac monitor and continuous pulse ox. Vitals WDL.

## 2020-11-03 NOTE — Telephone Encounter (Signed)
Routing to Dr. Annamaria Boots as Brady Rainier  Hutton Brooks is in the hospital now with Acute COPD Event and would like for Dr Annamaria Boots to be involved with his care and discharge plans. Is this possible?

## 2020-11-03 NOTE — Consult Note (Addendum)
[facility] Consult   Reason for Consult:  Depression Referring Physician:  Niel Hummer A Patient Identification: Brady VEJAR Sr. MRN:  [id number] Principal Diagnosis: COPD with acute exacerbation ([place]) Diagnosis:  Principal Problem:   COPD with acute exacerbation ([place]) Active Problems:   Coronary artery disease   Tobacco abuse   Essential hypertension   PVD (peripheral vascular disease) ([place])   Total Time spent with patient: 20 minutes  Subjective:   Brady Crumb Sr. is a 70 y.o. male patient admitted with increasing progressive shortness of breath.  HPI:   Patient seen at bedside today. Patient doing well. Pt agreeable to zoloft 50 mg qd and has not had any problems. Pt still thinks that nicotine patch is effective in "taking edge off"  Pt denies SI/HI/AVH.  Past Psychiatric History: None  Risk to Self:  No Risk to Others:  No Prior Inpatient Therapy: No  Prior Outpatient Therapy:  No  Past Medical History:  Past Medical History:  Diagnosis Date   COPD (chronic obstructive pulmonary disease) ([place])    Coronary artery disease    a. s/p RCA stenting back in 2005 with a known chronically occluded circumflex and normal LV function.   Emphysema lung (H[facility])    GERD (gastroesophageal reflux disease)    Heart attack ([place]) <2005   History of tobacco abuse    Hyperlipidemia    Hypertension    Lung nodule    a. Suspicious for malignancy, undergoing workup.   Obesity    Peripheral arterial disease ([place])    a. history of stent to right common iliac artery 12/15/99 with a peak for balloon-expandable stent. b. s/p intervention on RCIA total occlusion PTA and stent, residual disease on the right for possible staged intervention, notable disease on the left but asymptomatic in 2015. c. 10/2016: s/p PTA and stenting of left common iliac    Past Surgical History:  Procedure Laterality Date   CARDIAC CATHETERIZATION  10/26/1999   Archie Endo 09/13/2010    Cardiolite study     59% ejection fraction and negative for ischemia   CORONARY ANGIOPLASTY WITH STENT PLACEMENT  2005   [place]us stent placed to his RCA    [place] Right 12/15/1999   a. history of stent to right common iliac artery 12/15/99 with a peak for balloon-expandable stent. b. s/p intervention on RCIA total occlusion PTA and stent, residual disease on the right for possible staged intervention, notable disease on the left but asymptomatic.   ILIAC VEIN ANGIOPLASTY / STENTING Right 03/16/2014   rt common iliac    by dr berry   LAPAROSCOPIC CHOLECYSTECTOMY  1990s   LOWER EXTREMITY ANGIOGRAM N/A 03/16/2014   Procedure: LOWER EXTREMITY ANGIOGRAM;  Surgeon: [name], MD;  Location: [facility] CATH LAB;  Service: Cardiovascular;  Laterality: N/A;   LOWER EXTREMITY ANGIOGRAPHY N/A 10/30/2016   Procedure: Lower Extremity Angiography;  Surgeon: [name], MD;  Location: [facility] CV LAB;  Service: Cardiovascular;  Laterality: N/A;   LOWER EXTREMITY ANGIOGRAPHY N/A 11/30/2016   Procedure: Lower Extremity Angiography;  Surgeon: [name], MD;  Location: [facility] CV LAB;  Service: Cardiovascular;  Laterality: N/A;   PERIPHERAL VASCULAR INTERVENTION Left 10/30/2016   Procedure: Peripheral Vascular Intervention;  Surgeon: [name], MD;  Location: [facility] CV LAB;  Service: Cardiovascular;  Laterality: Left;  Lt Com ILIAC   TONSILLECTOMY AND ADENOIDECTOMY  7062B   UMBILICAL HERNIA REPAIR  2012  Family History:  Family History  Problem Relation Age of Onset   Heart disease Father    Asthma Son    Lung cancer Paternal Grandfather    CAD Brother    CAD Brother    Family Psychiatric  History: N/A Social History:  Social History   Substance and Sexual Activity  Alcohol Use Yes   Comment: 10/30/2016 "might have a few drinks 4-5 times/year"     Social History   Substance and Sexual Activity  Drug Use No    Social History    Socioeconomic History   Marital status: Married    Spouse name: Olin Hauser   Number of children: 2   Years of education: Not on file   Highest education level: Not on file  Occupational History   Occupation: textile manf  Tobacco Use   Smoking status: Every Day    Packs/day: 2.00    Years: 50.00    Pack years: 100.00    Types: Cigarettes   Smokeless tobacco: Never   Tobacco comments:    currently smoking 1ppd as of 4/8  Vaping Use   Vaping Use: Never used  Substance and Sexual Activity   Alcohol use: Yes    Comment: 10/30/2016 "might have a few drinks 4-5 times/year"   Drug use: No   Sexual activity: Not Currently  Other Topics Concern   Not on file  Social History Narrative   Not on file   Social Determinants of Health   Financial Resource Strain: Not on file  Food Insecurity: Not on file  Transportation Needs: Not on file  Physical Activity: Not on file  Stress: Not on file  Social Connections: Not on file   Additional Social History:    Allergies:   Allergies  Allergen Reactions   Amlodipine Cough   Aspirin Other (See Comments)    Sneezes, watery nose, wheeze (no polyps)    Contrast Media [Iodinated Diagnostic Agents]     Need to be pre-medicated, sneezing, watery eyes   Erythromycin Hives    Childhood allergy All mycin drugs    Losartan Potassium Cough   Penicillins     Childhood allergy Has patient had a PCN reaction causing immediate rash, facial/tongue/throat swelling, SOB or lightheadedness with hypotension: Unknown Has patient had a PCN reaction causing severe rash involving mucus membranes or skin necrosis: Unknown Has patient had a PCN reaction that required hospitalization: No Has patient had a PCN reaction occurring within the last 10 years: No States he has had benadryl when taking this and had no problems recently     Simvastatin Other (See Comments)    Hip pain   Sulfa Antibiotics     unknown    Labs:  Results for orders placed or  performed during the hospital encounter of 10/31/20 (from the past 48 hour(s))  VITAMIN D 25 Hydroxy (Vit-D Deficiency, Fractures)     Status: None   Collection Time: 11/01/20 10:44 AM  Result Value Ref Range   Vit D, 25-Hydroxy 40.27 30 - 100 ng/mL    Comment: (NOTE) Vitamin D deficiency has been defined by the Institute of Medicine  and an Endocrine Society practice guideline as a level of serum 25-OH  vitamin D less than 20 ng/mL (1,2). The Endocrine Society went on to  further define vitamin D insufficiency as a level between 21 and 29  ng/mL (2).  1. IOM (Institute of Medicine). 2010. Dietary reference intakes for  calcium and D. [place]: The [employer].  2. Holick MF, Binkley Stowell, Bischoff-Ferrari HA, et al. Evaluation,  treatment, and prevention of vitamin D deficiency: an Endocrine  Society clinical practice guideline, JCEM. 2011 Jul; 96(7): 1911-30.  Performed at [facility], [place] [address], [place], [place] [address]   D-dimer, quantitative     Status: Abnormal   Collection Time: 11/01/20 11:38 AM  Result Value Ref Range   D-Dimer, Quant 0.85 (H) 0.00 - 0.50 ug/mL-FEU    Comment: (NOTE) At the manufacturer cut-off value of 0.5 g/mL FEU, this assay has a negative predictive value of 95-100%.This assay is intended for use in conjunction with a clinical pretest probability (PTP) assessment model to exclude pulmonary embolism (PE) and deep venous thrombosis (DVT) in outpatients suspected of PE or DVT. Results should be correlated with clinical presentation. Performed at [facility], [place] [address], [place], [place] [address]   CBC     Status: Abnormal   Collection Time: 11/02/20  3:34 AM  Result Value Ref Range   WBC 13.1 (H) 4.0 - 10.5 K/uL   RBC 4.60 4.22 - 5.81 MIL/uL   Hemoglobin 15.9 13.0 - 17.0 g/dL   HCT 46.9 39.0 - 52.0 %   MCV 102.0 (H) 80.0 - 100.0 fL   MCH 34.6 (H) 26.0 - 34.0 pg   MCHC 33.9 30.0 - 36.0 g/dL   RDW 12.9  11.5 - 15.5 %   Platelets 161 150 - 400 K/uL   nRBC 0.0 0.0 - 0.2 %    Comment: Performed at [facility], [place] [address] [place],  [address]  Basic metabolic panel     Status: Abnormal   Collection Time: 11/02/20  3:34 AM  Result Value Ref Range   Sodium 139 135 - 145 mmol/L   Potassium 4.6 3.5 - 5.1 mmol/L   Chloride 102 98 - 111 mmol/L   CO2 28 22 - 32 mmol/L   Glucose, Bld 148 (H) 70 - 99 mg/dL    Comment: Glucose reference range applies only to samples taken after fasting for at least 8 hours.   BUN 20 8 - 23 mg/dL   Creatinine, Ser 0.80 0.61 - 1.24 mg/dL   Calcium 8.7 (L) 8.9 - 10.3 mg/dL   GFR, Estimated >60 >60 mL/min    Comment: (NOTE) Calculated using the CKD-EPI Creatinine Equation (2021)    Anion gap 9 5 - 15    Comment: Performed at [facility] [address], [place],  [address]  Vitamin B12     Status: None   Collection Time: 11/02/20  7:21 AM  Result Value Ref Range   Vitamin B-12 320 180 - 914 pg/mL    Comment: (NOTE) This assay is not validated for testing neonatal or myeloproliferative syndrome specimens for Vitamin B12 levels. Performed at [facility], [place] [address] [place], [place] [address]   Folate, serum, performed at [facility]     Status: None   Collection Time: 11/02/20  7:21 AM  Result Value Ref Range   Folate 7.8 >5.9 ng/mL    Comment: Performed at [facility], [place] [address] [place] [address]  CBC     Status: Abnormal   Collection Time: 11/03/20  4:16 AM  Result Value Ref Range   WBC 16.3 (H) 4.0 - 10.5 K/uL   RBC 4.73 4.22 - 5.81 MIL/uL   Hemoglobin 16.3 13.0 - 17.0 g/dL   HCT 47.4 39.0 - 52.0 %  MCV 100.2 (H) 80.0 - 100.0 fL   MCH 34.5 (H) 26.0 - 34.0 pg   MCHC 34.4 30.0 - 36.0 g/dL   RDW 12.5 11.5 - 15.5 %   Platelets 152 150 - 400 K/uL   nRBC 0.0 0.0 - 0.2 %    Comment: Performed at [facility], [place] [address] [place], [place] [address]  Basic metabolic panel      Status: Abnormal   Collection Time: 11/03/20  4:16 AM  Result Value Ref Range   Sodium 138 135 - 145 mmol/L   Potassium 4.5 3.5 - 5.1 mmol/L   Chloride 106 98 - 111 mmol/L   CO2 26 22 - 32 mmol/L   Glucose, Bld 184 (H) 70 - 99 mg/dL    Comment: Glucose reference range applies only to samples taken after fasting for at least 8 hours.   BUN 22 8 - 23 mg/dL   Creatinine, Ser 0.79 0.61 - 1.24 mg/dL   Calcium 8.6 (L) 8.9 - 10.3 mg/dL   GFR, Estimated >60 >60 mL/min    Comment: (NOTE) Calculated using the CKD-EPI Creatinine Equation (2021)    Anion gap 6 5 - 15    Comment: Performed at [place] [address], [place], [place] [address]    Current Facility-Administered Medications  Medication Dose Route Frequency Provider Last Rate Last Admin   0.9 %  sodium chloride infusion  250 mL Intravenous PRN Vashti Hey, MD       acetaminophen (TYLENOL) tablet 650 mg  650 mg Oral Q6H PRN Vashti Hey, MD       Or   acetaminophen (TYLENOL) suppository 650 mg  650 mg Rectal Q6H PRN Bonnell Public Tublu, MD       albuterol (PROVENTIL) (2.5 MG/3ML) 0.083% nebulizer solution 2.5 mg  2.5 mg Nebulization Q6H PRN Vashti Hey, MD       clopidogrel (PLAVIX) tablet 75 mg  75 mg Oral Daily Bonnell Public Tublu, MD   75 mg at 11/02/20 0825   doxycycline (VIBRA-TABS) tablet 100 mg  100 mg Oral Q12H Regalado, Belkys A, MD   100 mg at 11/02/20 2158   enoxaparin (LOVENOX) injection 40 mg  40 mg Subcutaneous Q24H Bonnell Public Tublu, MD   40 mg at 11/02/20 0824   guaiFENesin (MUCINEX) 12 hr tablet 1,200 mg  1,200 mg Oral BID Regalado, Belkys A, MD   1,200 mg at 11/02/20 2158   ipratropium-albuterol (DUONEB) 0.5-2.5 (3) MG/3ML nebulizer solution 3 mL  3 mL Nebulization TID Regalado, Belkys A, MD   3 mL at 11/02/20 2016   loratadine (CLARITIN) tablet 10 mg  10 mg Oral Daily Bonnell Public Tublu, MD   10 mg at 11/02/20 0825   methylPREDNISolone  sodium succinate (SOLU-MEDROL) 125 mg/2 mL injection 60 mg  60 mg Intravenous Q8H Regalado, Belkys A, MD   60 mg at 11/03/20 0643   nicotine (NICODERM CQ - dosed in mg/24 hours) patch 21 mg  21 mg Transdermal Daily Bonnell Public Tublu, MD   21 mg at 11/02/20 0827   polyethylene glycol (MIRALAX / GLYCOLAX) packet 17 g  17 g Oral Daily PRN Bonnell Public Tublu, MD       rosuvastatin (CRESTOR) tablet 20 mg  20 mg Oral Daily Bonnell Public Tublu, MD   20 mg at 11/02/20 0826   sertraline (ZOLOFT) tablet 50 mg  50 mg Oral Daily Damita Dunnings B, MD   50 mg at 11/02/20 1324  sodium chloride flush (NS) 0.9 % injection 3 mL  3 mL Intravenous Q12H Bonnell Public Tublu, MD   3 mL at 11/02/20 2159   sodium chloride flush (NS) 0.9 % injection 3 mL  3 mL Intravenous PRN Vashti Hey, MD       umeclidinium-vilanterol (ANORO ELLIPTA) 62.5-25 MCG/INH 1 puff  1 puff Inhalation Daily Bonnell Public Tublu, MD   1 puff at 11/02/20 0801   vitamin B-12 (CYANOCOBALAMIN) tablet 100 mcg  100 mcg Oral Daily Regalado, Belkys A, MD   100 mcg at 11/02/20 1612      Psychiatric Specialty Exam:  Presentation  General Appearance: Appropriate for Environment  Eye Contact:Good  Speech:Clear and Coherent  Speech Volume:Normal  Handedness: No data recorded  Mood and Affect  Mood:Depressed  Affect:Appropriate   Thought Process  Thought Processes:Coherent  Descriptions of Associations:Intact  Orientation:Full (Time, Place and Person)  Thought Content:Logical  History of Schizophrenia/Schizoaffective disorder:No data recorded Duration of Psychotic Symptoms:No data recorded Hallucinations:Hallucinations: None  Ideas of Reference:None  Suicidal Thoughts:Suicidal Thoughts: No  Homicidal Thoughts:Homicidal Thoughts: No   Sensorium  Memory:Immediate Good; Recent Good; Remote Good  Judgment:Fair  Insight:Fair   Executive Functions  Concentration:Fair  Attention  Span:Fair  [place]   Psychomotor Activity  Psychomotor Activity:Psychomotor Activity: Normal   Assets  Assets:Desire for Improvement; Armed forces logistics/support/administrative officer; Housing; Social Support   Sleep  Sleep:Sleep: Good   Physical Exam: Physical Exam Vitals reviewed.  Constitutional:      Appearance: Normal appearance.  HENT:     Head: Normocephalic and atraumatic.  Eyes:     Extraocular Movements: Extraocular movements intact.  Cardiovascular:     Rate and Rhythm: Normal rate and regular rhythm.  Abdominal:     General: Abdomen is flat.     Palpations: Abdomen is soft.  Musculoskeletal:        General: Normal range of motion.     Cervical back: Normal range of motion.  Skin:    General: Skin is warm and dry.  Neurological:     General: No focal deficit present.     Mental Status: He is alert and oriented to person, place, and time.   Review of Systems  Constitutional:  Negative for chills and fever.  Cardiovascular:  Negative for chest pain and palpitations.  Gastrointestinal:  Negative for abdominal pain, nausea and vomiting.  Skin:  Negative for itching and rash.  Neurological:  Negative for dizziness and headaches.  Blood pressure (!) 144/93, pulse 90, temperature 98.1 F (36.7 C), temperature source Oral, resp. rate 18, height 5\' 10"  (1.778 m), weight 88 kg, SpO2 96 %. Body mass index is 27.84 kg/m.  Treatment Plan Summary: Pt is a 70 yo male w/ 100 pack year smoking, COPD, seasonal allergies, PVD/CAD presented to hospital to progressive increase in shortness of breath.  Pt doing well today and still agreeable to nicotine patch and zoloft 50 mg qd.  MDD Smoking Cessation -Continue zoloft 50 mg qd -Continue nicotine patch -Recommend setting up outpatient psychiatrist and therapist to manage medication and aid with smoking cessation -Signing off  Disposition: No evidence of imminent risk to self or others at present.     Thank you for the consult, France Ravens, MD PGY1 Psychiatry Resident 11/03/2020 8:12 AM

## 2020-11-03 NOTE — Progress Notes (Addendum)
Progress Note  Patient Name: Brady KEAN Sr. 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 She is worried that some of his SOB is angina  Inpatient Medications    Scheduled Meds:  clopidogrel  75 mg Oral Daily   doxycycline  100 mg Oral Q12H   enoxaparin (LOVENOX) injection  40 mg Subcutaneous Q24H   guaiFENesin  1,200 mg Oral BID   ipratropium-albuterol  3 mL Nebulization TID   loratadine  10 mg Oral Daily   methylPREDNISolone (SOLU-MEDROL) injection  60 mg Intravenous Q8H   nicotine  21 mg Transdermal Daily   rosuvastatin  20 mg Oral Daily   sertraline  50 mg Oral Daily   sodium chloride flush  3 mL Intravenous Q12H   umeclidinium-vilanterol  1 puff Inhalation Daily   vitamin B-12  100 mcg Oral Daily   Continuous Infusions:  sodium chloride     PRN Meds: sodium chloride, acetaminophen **OR** acetaminophen, albuterol, polyethylene glycol, sodium chloride flush   Vital Signs    Vitals:   11/02/20 1700 11/02/20 2016 11/02/20 2301 11/03/20 0445  BP:   (!) 154/94 (!) 144/93  Pulse: 100  91 90  Resp:   16 18  Temp:   98.2 F (36.8 C) 98.1 F (36.7 C)  TempSrc:   Oral Oral  SpO2:  95% 94% 96%  Weight:      Height:        Intake/Output Summary (Last 24 hours) at 11/03/2020 0907 Last data filed at 11/02/2020 2303 Gross per 24 hour  Intake 243 ml  Output --  Net 243 ml   Last 3 Weights 10/31/2020 08/06/2020 02/12/2020  Weight (lbs) 194 lb 194 lb 12.8 oz 192 lb  Weight (kg) 87.998 kg 88.361 kg 87.091 kg      Telemetry    SR for last  several hours, episodes of SVT and ?Afib  - Personally Reviewed  ECG    None today, EMS ECG reviewed, minor ST changes from admission ECG - Personally Reviewed  Physical Exam   GEN: No acute distress.   Neck: No JVD Cardiac: RRR, no murmur, no rubs, or gallops.  Respiratory: diminished to auscultation bilaterally with wheezing and some rales  GI: Soft, nontender, non-distended  MS: No edema; No deformity. Neuro:  Nonfocal  Psych: Normal affect   Labs    High Sensitivity Troponin:   Recent Labs  Lab 10/31/20 0224 10/31/20 0444 10/31/20 1119 10/31/20 1702  TROPONINIHS 42* 44* 35* 34*      Chemistry Recent Labs  Lab 11/01/20 0022 11/02/20 0334 11/03/20 0416  NA 139 139 138  K 4.3 4.6 4.5  CL 105 102 106  CO2 27 28 26   GLUCOSE 131* 148* 184*  BUN 25* 20 22  CREATININE 1.10 0.80 0.79  CALCIUM 9.1 8.7* 8.6*  GFRNONAA >60 >60 >60  ANIONGAP 7 9 6      Hematology Recent Labs  Lab 10/31/20 0224 10/31/20 1018 11/02/20 0334 11/03/20 0416  WBC 14.6*  --  13.1* 16.3*  RBC 4.80  --  4.60 4.73  HGB 16.7 17.0 15.9 16.3  HCT 49.4 50.0 46.9 47.4  MCV 102.9*  --  102.0* 100.2*  MCH 34.8*  --  34.6* 34.5*  MCHC 33.8  --  33.9 34.4  RDW 13.0  --  12.9 12.5  PLT 163  --  161 152    BNPNo results for input(s): BNP, PROBNP in the last 168 hours.   DDimer  Recent Labs  Lab 11/01/20 1138  DDIMER 0.85*    Lab Results  Component Value Date   TSH 0.87 09/12/2016   No results found for: HGBA1C Lab Results  Component Value Date   CHOL 130 11/17/2019   HDL 55 11/17/2019   LDLCALC 61 11/17/2019   TRIG 65 11/17/2019   CHOLHDL 2.4 11/17/2019     Radiology    CT Angio Chest Pulmonary Embolism (PE) W or WO Contrast  Result Date: 11/01/2020 CLINICAL DATA:  Shortness of breath. EXAM: CT ANGIOGRAPHY CHEST WITH CONTRAST TECHNIQUE: Multidetector CT imaging of the chest was performed using the standard protocol during bolus administration of intravenous contrast.  Multiplanar CT image reconstructions and MIPs were obtained to evaluate the vascular anatomy. CONTRAST:  68mL OMNIPAQUE IOHEXOL 350 MG/ML SOLN COMPARISON:  March 11, 2020. FINDINGS: Cardiovascular: Satisfactory opacification of the pulmonary arteries to the segmental level. No evidence of pulmonary embolism. Normal heart size. No pericardial effusion. Coronary artery calcifications are noted. Mediastinum/Nodes: No enlarged mediastinal, hilar, or axillary lymph nodes. Thyroid gland, trachea, and esophagus demonstrate no significant findings. Lungs/Pleura: No pneumothorax or pleural effusion is noted. Emphysematous disease is noted throughout both lungs. No acute pulmonary disease is noted. Upper Abdomen: No acute abnormality. Musculoskeletal: No chest wall abnormality. No acute or significant osseous findings. Review of the MIP images confirms the above findings. IMPRESSION: No definite evidence of pulmonary embolus. Coronary artery calcifications are noted suggesting coronary artery disease. Aortic Atherosclerosis (ICD10-I70.0) and Emphysema (ICD10-J43.9). Electronically Signed   By: Marijo Conception M.D.   On: 11/01/2020 19:57   ECHOCARDIOGRAM COMPLETE  Result Date: 11/02/2020    ECHOCARDIOGRAM REPORT   Patient Name:   Brady DEBSKI Sr. Date of Exam: 11/02/2020 Medical Rec #:  [id number]             Height:       70.0 in Accession #:    [id number]            Weight:       194.0 lb Date of Birth:  [birth year]             BSA:          2.060 m Patient Age:    70 years              BP:           130/81 mmHg Patient Gender: M                     HR:           67 bpm. Exam Location:  Inpatient Procedure: 2D Echo, Cardiac Doppler and Color Doppler Indications:    Chest pain  History:        Patient has no prior history of Echocardiogram examinations.                 CAD, COPD; Risk Factors:Current Smoker and Hypertension.  Sonographer:    Cammy Brochure Referring Phys: [phone] HAO MENG  Sonographer Comments:  Image acquisition challenging due to COPD  and Image acquisition challenging due to patient body habitus. IMPRESSIONS  1. Left ventricular ejection fraction, by estimation, is 40 to 45%. The left ventricle has mildly decreased function. The left ventricle demonstrates regional wall motion abnormalities (see scoring diagram/findings for description). The left ventricular  internal cavity size was moderately dilated. There is mild concentric left ventricular hypertrophy. Left ventricular diastolic parameters are consistent with Grade II diastolic dysfunction (pseudonormalization).  2. Right ventricular systolic function is normal. The right ventricular size is normal. Tricuspid regurgitation signal is inadequate for assessing PA pressure.  3. Left atrial size was moderately dilated.  4. The mitral valve is grossly normal. No evidence of mitral valve regurgitation. No evidence of mitral stenosis.  5. The aortic valve is calcified. There is mild calcification of the aortic valve. There is moderate thickening of the aortic valve. Aortic valve regurgitation is not visualized. Mild to moderate aortic valve sclerosis/calcification is present, without any evidence of aortic stenosis.  6. The inferior vena cava is normal in size with greater than 50% respiratory variability, suggesting right atrial pressure of 3 mmHg. Comparison(s): No prior Echocardiogram. FINDINGS  Left Ventricle: Left ventricular ejection fraction, by estimation, is 40 to 45%. The left ventricle has mildly decreased function. The left ventricle demonstrates regional wall motion abnormalities. The left ventricular internal cavity size was moderately dilated. There is mild concentric left ventricular hypertrophy. Left ventricular diastolic parameters are consistent with Grade II diastolic dysfunction (pseudonormalization).  LV Wall Scoring: The antero-lateral wall and posterior wall are hypokinetic. Right Ventricle: The right ventricular size is normal. No  increase in right ventricular wall thickness. Right ventricular systolic function is normal. Tricuspid regurgitation signal is inadequate for assessing PA pressure. Left Atrium: Left atrial size was moderately dilated. Right Atrium: Right atrial size was normal in size. Pericardium: There is no evidence of pericardial effusion. Mitral Valve: The mitral valve is grossly normal. There is moderate thickening of the mitral valve leaflet(s). Mild mitral annular calcification. No evidence of mitral valve regurgitation. No evidence of mitral valve stenosis. Tricuspid Valve: The tricuspid valve is normal in structure. Tricuspid valve regurgitation is not demonstrated. No evidence of tricuspid stenosis. Aortic Valve: The aortic valve is calcified. There is mild calcification of the aortic valve. There is moderate thickening of the aortic valve. Aortic valve regurgitation is not visualized. Mild to moderate aortic valve sclerosis/calcification is present, without any evidence of aortic stenosis. Aortic valve mean gradient measures 11.0 mmHg. Aortic valve peak gradient measures 21.3 mmHg. Aortic valve area, by VTI measures 1.43 cm. Pulmonic Valve: The pulmonic valve was not well visualized. Pulmonic valve regurgitation is not visualized. No evidence of pulmonic stenosis. Aorta: The aortic root and ascending aorta are structurally normal, with no evidence of dilitation. Venous: The inferior vena cava is normal in size with greater than 50% respiratory variability, suggesting right atrial pressure of 3 mmHg. IAS/Shunts: The atrial septum is grossly normal.  LEFT VENTRICLE PLAX 2D LVIDd:         5.75 cm  Diastology LVIDs:         4.10 cm  LV e' medial:    6.64 cm/s LV PW:         1.20 cm  LV E/e' medial:  17.8 LV IVS:        1.10 cm  LV e' lateral:   10.30 cm/s LVOT diam:     2.00 cm  LV E/e' lateral: 11.5 LV SV:         63 LV SV Index:  30 LVOT Area:     3.14 cm  RIGHT VENTRICLE             IVC RV Basal diam:  3.50 cm     IVC  diam: 1.70 cm RV S prime:     14.40 cm/s TAPSE (M-mode): 2.5 cm LEFT ATRIUM             Index       RIGHT ATRIUM           Index LA diam:        4.70 cm 2.28 cm/m  RA Area:     12.00 cm LA Vol (A2C):   75.6 ml 36.69 ml/m RA Volume:   26.50 ml  12.86 ml/m LA Vol (A4C):   94.6 ml 45.91 ml/m LA Biplane Vol: 85.5 ml 41.50 ml/m  AORTIC VALVE AV Area (Vmax):    1.37 cm AV Area (Vmean):   1.39 cm AV Area (VTI):     1.43 cm AV Vmax:           231.00 cm/s AV Vmean:          155.000 cm/s AV VTI:            0.436 m AV Peak Grad:      21.3 mmHg AV Mean Grad:      11.0 mmHg LVOT Vmax:         101.00 cm/s LVOT Vmean:        68.700 cm/s LVOT VTI:          0.199 m LVOT/AV VTI ratio: 0.46  AORTA Ao Root diam: 3.30 cm Ao Asc diam:  2.70 cm MITRAL VALVE MV Area (PHT): 6.71 cm     SHUNTS MV Decel Time: 113 msec     Systemic VTI:  0.20 m MV E velocity: 118.00 cm/s  Systemic Diam: 2.00 cm MV A velocity: 118.00 cm/s MV E/A ratio:  1.00 Rudean Haskell MD Electronically signed by Rudean Haskell MD Signature Date/Time: 11/02/2020/3:11:02 PM    Final     Cardiac Studies   ECHO: 11/02/2020  1. Left ventricular ejection fraction, by estimation, is 40 to 45%. The  left ventricle has mildly decreased function. The left ventricle  demonstrates regional wall motion abnormalities (see scoring  diagram/findings for description). The left ventricular   internal cavity size was moderately dilated. There is mild concentric  left ventricular hypertrophy. Left ventricular diastolic parameters are  consistent with Grade II diastolic dysfunction (pseudonormalization).   2. Right ventricular systolic function is normal. The right ventricular  size is normal. Tricuspid regurgitation signal is inadequate for assessing PA pressure.   3. Left atrial size was moderately dilated. R atrium normal in size  4. The mitral valve is grossly normal. No evidence of mitral valve  regurgitation. No evidence of mitral stenosis.   5. The  aortic valve is calcified. There is mild calcification of the  aortic valve. There is moderate thickening of the aortic valve. Aortic  valve regurgitation is not visualized. Mild to moderate aortic valve  sclerosis/calcification is present, without  any evidence of aortic stenosis.   6. The inferior vena cava is normal in size with greater than 50%  respiratory variability, suggesting right atrial pressure of 3 mmHg.   Cath 10/15/2003 CORONARY ARTERIOGRAPHY:  Calcification was noted in the proximal LAD on  fluoroscopy.    1. Left main normal.  2. LAD:  The left anterior descending crossed the apex of the heart and had mild irregularities  in the mid portion.  The first diagonal also had mild irregularities.  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  3. Circumflex:  The circumflex had moderate irregularities in the mid     portion just before it became 100% occluded.  There were two very small OM vessels that came off the proximal portion.  What appears to be a relatively large third OM was totally occluded, but filled retrograde and late via left-to-left collaterals (this is unchanged from 2001 cath).  4. Right coronary artery:  The right coronary artery is a dominant vessel giving rise to PDA and posterior lateral branch.  The proximal portion of the right coronary artery had an eccentric, but focal area of 90% narrowing.  The mid and distal vessel were free of disease.    CONCLUSION:  1. 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  2. Mild irregularities in the left anterior descending.  3. High grade stenosis in the proximal right coronary artery.  4. Normal left ventricular function.  5. Apparent restenosis within the right iliac stent.    Because of the high grade stenosis in the right coronary artery,  arrangements were made for intervention.  The 5 French sheath was  exchanged out for 6 [place] sheath.  JR-4 guide catheter with side holes was used.  A short Luge wire was placed down the distal right coronary artery and a 2.5 x 9 Maverick balloon was used for dilatation at 8 atmospheres x 45.  Two inflations were performed.  Following this, a 3.0 x 16 Taxus stent was placed in such a manner that both the proximal and distal portion was well covered.  It was deployed initially at 13 atmospheres for 53 seconds.  The  final inflation at 15 atmospheres for 55 seconds.    The 90% area of the proximal right coronary artery before intervention is now less than 10% narrowed.  There was no evidence of any dissection or thrombus or distal embolization.  There was TIMI-3 flow pre and post intervention.    The patient was maintained on heparin with therapeutic ACTs with final ACT at the end of the procedure being 233.  Integrilin infusion was also performed and will be carried out for additional 12 hours.    The patient was given 300 mg of oral Plavix and he should be ready for discharge in the morning.  His LDL is 70 and the only untreated risk factor is his continued cigarette abuse which I discussed in detail with the patient.    Patient Profile     70 y.o. male with PMH of CAD, PAD, COPD, HTN and tobacco use presented with DOE ad chest pain  Assessment & Plan    Chest pain and DOE - mild trop elevation, in the setting of COPD exacerbation - explained to wife in detail that standard of care is to allow him to recover from his acute illness and f/u with Dr Gwenlyn Found to discuss cath - explained that the risk of cath is significantly increased with his acute COPD exacerbation - explained that his chronically occluded CFX could be responsible for his mild trop elevation in the setting of hypoxia - Wife will discuss further w/ MD - continue Plavix, no BB, statin - add low-dose Imdur and see how tolerated - get records from Dr Rosario's office on echo (at least 5 yr ago) to  see  if EF is significantly changes - w/ known dz, would not do stress test  Acute COPD exacerbation:  - on nebs and steroids  SVT:  - review w/ MD, some of this is slightly irregular - multiple episodes of tachycardia as well as PVCs on telemetry - discuss if it would help to add low-dose Cardizem - if truly Afib, discuss anticoagulation   CAD:  - see cath report above - on Plavix, intol ASA - no BB w/ lung dz - LDL was at goal a year ago, recheck  Tobacco abuse:   - recently quit - on nicotine patch - per IM  PAD:  - no sx - continue Plavix  HLD:  - on statin, recheck profile  HTN - BP generally well-controlled - may run a little high while on steroids     For questions or updates, please contact [facility] Please consult [email or web address] for contact info under        Signed, Rosaria Ferries, PA-C  11/03/2020, 9:07 AM     Patient seen and examined and agree with Rosaria Ferries, PA-C as detailed above.   In brief, the patient is a 70 y.o. male with a hx of CAD s/p RCA PCI and occluded Lcx in 2005, PAD, COPD, HTN,and tobacco use who presented with progressive SOB and chest pain found to have acute COPD exacerbation. Cardiology is consulted for further management of chest pain.   Suspect primary driver of acute presentation is acute COPD exacerbation. Trop flat. Unable to perform myoview due to acute COPD flare. TTE 11/02/20 with LVEF 40-45% with anterolateral and inferior wall hypokinesis, G2DD, mild LVH. Notably, has known occluded Lcx and prior RCA stent. Currently, patient is chest pain free and comfortable. Will need eventual ischemic work-up as out-patient once recovered from COPD standpoint.  Of note, patient having several runs of what appears to be Afib with RVR on tele. Discussed starting AC today with apixaban and patient amenable. Will need monitor at time of discharge to assess burden further. For now, will start low dose dilt (cannot tolerate metop due to  COPD) with plans to transition to BB if tolerated as outpatient once over acute COPD flare.    Plan: GEN: Sitting up in bed, comfortable Neck: No JVD Cardiac: RRR, no murmurs, rubs, or gallops. Respiratory: Better air movement, scattered expiratory wheezing GI: Soft, nontender, non-distended MS: No edema; No deformity. Neuro:  Nonfocal Psych: Normal affect     Plan: -Management of COPD exacerbation per primary team -Once recovered from COPD flare, will need ischemic evaluation likely with cath as per patient's wife, EF in past was reportedly normal post RCA intervention in 2005 (echo not seen in our system); notably has known occluded Lcx which may contribute to WMA seen on TTE -Unable to perform myoview as in-patient due to active COPD flare -Continue plavix 75mg  daily and crestor 20mg  daily for known CAD and PAD (has allergy to ASA) -Will start apixaban 5mg  daily for Afib detected on monitor -Start low dose dilt (not ideal agent due to mildly reduced LVEF but cannot tolerate BB due to active COPD flare) for rate control; can transition to BB as out-patient pending continued improvement from COPD perspective -Plan for zio on discharge to monitor burden of Afib to assess ongoing need for [facility]  Plan discussed at length with the patient and his wife.    Gwyndolyn Kaufman, MD

## 2020-11-03 NOTE — Progress Notes (Signed)
PROGRESS NOTE    Brady DENN Sr.  HYQ:[id number] DOB: Jul 28, 1950 DOA: 10/31/2020 PCP: Billie Ruddy, MD   Brief Narrative: 70 year old with past medical history significant for tobacco abuse, COPD, seasonal allergies, PVD/CAD who presents with 1 or 2 weeks progressive worsening shortness of breath.  He also report chest tightness. Patient was admitted for COPD exacerbation. -Had intermittent chest pain this admission, 2D echo noted EF of 40-45% with wall motion abnormalities, cardiology consulted   Assessment & Plan:   Acute COPD exacerbation.  Acute hypoxic respiratory failure -Clinically improving, still wheezing -Continue IV Solu-Medrol, antibiotics, duo nebs -Flutter valve  History of CAD, chest tightness, PVCs Demand ischemia -Troponin elevation, 2D echo with EF of 40-45% with regional wall motion abnormalities -Cardiology consulted, recommended ischemic evaluation likely cath in the near future after he has recovered from acute COPD exacerbation -Continue Plavix and Crestor  Paroxysmal atrial fibrillation -Intermittent episodes of A. fib, PVCs -Started on low-dose diltiazem for rate control by cards, cannot tolerate beta-blocker due to active COPD exacerbation -Started Eliquis  Depression;  -Appreciate psych input, continue Zoloft and nicotine patch  Tobacco abuse -100-pack-year smoking history -Counsel, nicotine patch  Macrocytosis:  -B 12  320. Started B 12 supplement.    Estimated body mass index is 27.84 kg/m as calculated from the following:   Height as of this encounter: 5\' 10"  (1.778 m).   Weight as of this encounter: 88 kg.   DVT prophylaxis: Lovenox Code Status: Full Code Family Communication: Discussed with wife at bedside Disposition Plan:  Status is: Inpatient  Remains inpatient appropriate because:IV treatments appropriate due to intensity of illness or inability to take PO  Dispo: The patient is from: Home              Anticipated  d/c is to: Home              Patient currently is not medically stable to d/c.   Difficult to place patient No        Consultants:  Cardiology   Procedures:  None  Antimicrobials:    Subjective: -Breathing improving, still with some wheezing   Objective: Vitals:   11/03/20 0927 11/03/20 1231 11/03/20 1231 11/03/20 1320  BP:  (!) 146/75 (!) 146/75   Pulse:  85 94   Resp:  16 16   Temp:  98.2 F (36.8 C) 98.2 F (36.8 C)   TempSrc:  Oral Oral   SpO2: 96% 91% 91% 94%  Weight:      Height:        Intake/Output Summary (Last 24 hours) at 11/03/2020 1407 Last data filed at 11/02/2020 2303 Gross per 24 hour  Intake 243 ml  Output --  Net 243 ml   Filed Weights   10/31/20 0229  Weight: 88 kg    Examination: Gen: Awake, Alert, Oriented X 3,  HEENT: no JVD Lungs: Bilateral expiratory wheezes CVS: S1S2/regular rhythm tachycardic Abd: soft, Non tender, non distended, BS present Extremities: No edema Skin: no new rashes on exposed skin     Data Reviewed: I have personally reviewed following labs and imaging studies  CBC: Recent Labs  Lab 10/31/20 0224 10/31/20 1018 11/02/20 0334 11/03/20 0416  WBC 14.6*  --  13.1* 16.3*  HGB 16.7 17.0 15.9 16.3  HCT 49.4 50.0 46.9 47.4  MCV 102.9*  --  102.0* 100.2*  PLT 163  --  161 952   Basic Metabolic Panel: Recent Labs  Lab 10/31/20 0224 10/31/20 1018 11/01/20 0022  11/02/20 0334 11/03/20 0416  NA 136 139 139 139 138  K 4.6 4.3 4.3 4.6 4.5  CL 103  --  105 102 106  CO2 23  --  27 28 26   GLUCOSE 148*  --  131* 148* 184*  BUN 17  --  25* 20 22  CREATININE 0.88  --  1.10 0.80 0.79  CALCIUM 8.7*  --  9.1 8.7* 8.6*  MG 2.0  --   --   --   --    GFR: Estimated Creatinine Clearance: 97.4 mL/min (by C-G formula based on SCr of 0.79 mg/dL). Liver Function Tests: No results for input(s): AST, ALT, ALKPHOS, BILITOT, PROT, ALBUMIN in the last 168 hours. No results for input(s): LIPASE, AMYLASE in the last 168  hours. No results for input(s): AMMONIA in the last 168 hours. Coagulation Profile: No results for input(s): INR, PROTIME in the last 168 hours. Cardiac Enzymes: No results for input(s): CKTOTAL, CKMB, CKMBINDEX, TROPONINI in the last 168 hours. BNP (last 3 results) No results for input(s): PROBNP in the last 8760 hours. HbA1C: No results for input(s): HGBA1C in the last 72 hours. CBG: No results for input(s): GLUCAP in the last 168 hours. Lipid Profile: No results for input(s): CHOL, HDL, LDLCALC, TRIG, CHOLHDL, LDLDIRECT in the last 72 hours. Thyroid Function Tests: No results for input(s): TSH, T4TOTAL, FREET4, T3FREE, THYROIDAB in the last 72 hours. Anemia Panel: Recent Labs    11/02/20 0721  VITAMINB12 320  FOLATE 7.8   Sepsis Labs: No results for input(s): PROCALCITON, LATICACIDVEN in the last 168 hours.  Recent Results (from the past 240 hour(s))  SARS CORONAVIRUS 2 (TAT 6-24 HRS) Nasopharyngeal Nasopharyngeal Swab     Status: None   Collection Time: 10/31/20  2:44 AM   Specimen: Nasopharyngeal Swab  Result Value Ref Range Status   SARS Coronavirus 2 NEGATIVE NEGATIVE Final    Comment: (NOTE) SARS-CoV-2 target nucleic acids are NOT DETECTED.  The SARS-CoV-2 RNA is generally detectable in upper and lower respiratory specimens during the acute phase of infection. Negative results do not preclude SARS-CoV-2 infection, do not rule out co-infections with other pathogens, and should not be used as the sole basis for treatment or other patient management decisions. Negative results must be combined with clinical observations, patient history, and epidemiological information. The expected result is Negative.  Fact Sheet for Patients: SugarRoll.be  Fact Sheet for Healthcare Providers: https://www.woods-mathews.com/  This test is not yet approved or cleared by the [place] FDA and  has been authorized for detection and/or  diagnosis of SARS-CoV-2 by FDA under an Emergency Use Authorization (EUA). This EUA will remain  in effect (meaning this test can be used) for the duration of the COVID-19 declaration under Se ction 564(b)(1) of the Act, 21 U.S.C. section 360bbb-3(b)(1), unless the authorization is terminated or revoked sooner.  Performed at [facility], [place] [address] [place], [place] [address]   Resp Panel by RT-PCR (Flu A&B, Covid) Nasopharyngeal Swab     Status: None   Collection Time: 10/31/20 10:32 AM   Specimen: Nasopharyngeal Swab; Nasopharyngeal(NP) swabs in vial transport medium  Result Value Ref Range Status   SARS Coronavirus 2 by RT PCR NEGATIVE NEGATIVE Final    Comment: (NOTE) SARS-CoV-2 target nucleic acids are NOT DETECTED.  The SARS-CoV-2 RNA is generally detectable in upper respiratory specimens during the acute phase of infection. The lowest concentration of SARS-CoV-2 viral copies this assay can detect is 138 copies/mL. A negative  result does not preclude SARS-Cov-2 infection and should not be used as the sole basis for treatment or other patient management decisions. A negative result may occur with  improper specimen collection/handling, submission of specimen other than nasopharyngeal swab, presence of viral mutation(s) within the areas targeted by this assay, and inadequate number of viral copies(<138 copies/mL). A negative result must be combined with clinical observations, patient history, and epidemiological information. The expected result is Negative.  Fact Sheet for Patients:  EntrepreneurPulse.com.au  Fact Sheet for Healthcare Providers:  IncredibleEmployment.be  This test is no t yet approved or cleared by the [place] FDA and  has been authorized for detection and/or diagnosis of SARS-CoV-2 by FDA under an Emergency Use Authorization (EUA). This EUA will remain  in effect (meaning this test can be used) for the  duration of the COVID-19 declaration under Section 564(b)(1) of the Act, 21 U.S.C.section 360bbb-3(b)(1), unless the authorization is terminated  or revoked sooner.       Influenza A by PCR NEGATIVE NEGATIVE Final   Influenza B by PCR NEGATIVE NEGATIVE Final    Comment: (NOTE) The Xpert Xpress SARS-CoV-2/FLU/RSV plus assay is intended as an aid in the diagnosis of influenza from Nasopharyngeal swab specimens and should not be used as a sole basis for treatment. Nasal washings and aspirates are unacceptable for Xpert Xpress SARS-CoV-2/FLU/RSV testing.  Fact Sheet for Patients: EntrepreneurPulse.com.au  Fact Sheet for Healthcare Providers: IncredibleEmployment.be  This test is not yet approved or cleared by the [place] FDA and has been authorized for detection and/or diagnosis of SARS-CoV-2 by FDA under an Emergency Use Authorization (EUA). This EUA will remain in effect (meaning this test can be used) for the duration of the COVID-19 declaration under Section 564(b)(1) of the Act, 21 U.S.C. section 360bbb-3(b)(1), unless the authorization is terminated or revoked.  Performed at [facility], [place] [address], [place], [place] [address]          Radiology Studies: CT Angio Chest Pulmonary Embolism (PE) W or WO Contrast  Result Date: 11/01/2020 CLINICAL DATA:  Shortness of breath. EXAM: CT ANGIOGRAPHY CHEST WITH CONTRAST TECHNIQUE: Multidetector CT imaging of the chest was performed using the standard protocol during bolus administration of intravenous contrast. Multiplanar CT image reconstructions and MIPs were obtained to evaluate the vascular anatomy. CONTRAST:  48mL OMNIPAQUE IOHEXOL 350 MG/ML SOLN COMPARISON:  March 11, 2020. FINDINGS: Cardiovascular: Satisfactory opacification of the pulmonary arteries to the segmental level. No evidence of pulmonary embolism. Normal heart size. No pericardial effusion. Coronary artery  calcifications are noted. Mediastinum/Nodes: No enlarged mediastinal, hilar, or axillary lymph nodes. Thyroid gland, trachea, and esophagus demonstrate no significant findings. Lungs/Pleura: No pneumothorax or pleural effusion is noted. Emphysematous disease is noted throughout both lungs. No acute pulmonary disease is noted. Upper Abdomen: No acute abnormality. Musculoskeletal: No chest wall abnormality. No acute or significant osseous findings. Review of the MIP images confirms the above findings. IMPRESSION: No definite evidence of pulmonary embolus. Coronary artery calcifications are noted suggesting coronary artery disease. Aortic Atherosclerosis (ICD10-I70.0) and Emphysema (ICD10-J43.9). Electronically Signed   By: Marijo Conception M.D.   On: 11/01/2020 19:57   ECHOCARDIOGRAM COMPLETE  Result Date: 11/02/2020    ECHOCARDIOGRAM REPORT   Patient Name:   Brady KORB Sr. Date of Exam: 11/02/2020 Medical Rec #:  [id number]             Height:       70.0 in Accession #:    [id number]  Weight:       194.0 lb Date of Birth:  09/05/1950             BSA:          2.060 m Patient Age:    36 years              BP:           130/81 mmHg Patient Gender: M                     HR:           67 bpm. Exam Location:  Inpatient Procedure: 2D Echo, Cardiac Doppler and Color Doppler Indications:    Chest pain  History:        Patient has no prior history of Echocardiogram examinations.                 CAD, COPD; Risk Factors:Current Smoker and Hypertension.  Sonographer:    Cammy Brochure Referring Phys: [phone] HAO MENG  Sonographer Comments: Image acquisition challenging due to COPD and Image acquisition challenging due to patient body habitus. IMPRESSIONS  1. Left ventricular ejection fraction, by estimation, is 40 to 45%. The left ventricle has mildly decreased function. The left ventricle demonstrates regional wall motion abnormalities (see scoring diagram/findings for description). The left ventricular   internal cavity size was moderately dilated. There is mild concentric left ventricular hypertrophy. Left ventricular diastolic parameters are consistent with Grade II diastolic dysfunction (pseudonormalization).  2. Right ventricular systolic function is normal. The right ventricular size is normal. Tricuspid regurgitation signal is inadequate for assessing PA pressure.  3. Left atrial size was moderately dilated.  4. The mitral valve is grossly normal. No evidence of mitral valve regurgitation. No evidence of mitral stenosis.  5. The aortic valve is calcified. There is mild calcification of the aortic valve. There is moderate thickening of the aortic valve. Aortic valve regurgitation is not visualized. Mild to moderate aortic valve sclerosis/calcification is present, without any evidence of aortic stenosis.  6. The inferior vena cava is normal in size with greater than 50% respiratory variability, suggesting right atrial pressure of 3 mmHg. Comparison(s): No prior Echocardiogram. FINDINGS  Left Ventricle: Left ventricular ejection fraction, by estimation, is 40 to 45%. The left ventricle has mildly decreased function. The left ventricle demonstrates regional wall motion abnormalities. The left ventricular internal cavity size was moderately dilated. There is mild concentric left ventricular hypertrophy. Left ventricular diastolic parameters are consistent with Grade II diastolic dysfunction (pseudonormalization).  LV Wall Scoring: The antero-lateral wall and posterior wall are hypokinetic. Right Ventricle: The right ventricular size is normal. No increase in right ventricular wall thickness. Right ventricular systolic function is normal. Tricuspid regurgitation signal is inadequate for assessing PA pressure. Left Atrium: Left atrial size was moderately dilated. Right Atrium: Right atrial size was normal in size. Pericardium: There is no evidence of pericardial effusion. Mitral Valve: The mitral valve is grossly  normal. There is moderate thickening of the mitral valve leaflet(s). Mild mitral annular calcification. No evidence of mitral valve regurgitation. No evidence of mitral valve stenosis. Tricuspid Valve: The tricuspid valve is normal in structure. Tricuspid valve regurgitation is not demonstrated. No evidence of tricuspid stenosis. Aortic Valve: The aortic valve is calcified. There is mild calcification of the aortic valve. There is moderate thickening of the aortic valve. Aortic valve regurgitation is not visualized. Mild to moderate aortic valve sclerosis/calcification is present, without any evidence of aortic stenosis.  Aortic valve mean gradient measures 11.0 mmHg. Aortic valve peak gradient measures 21.3 mmHg. Aortic valve area, by VTI measures 1.43 cm. Pulmonic Valve: The pulmonic valve was not well visualized. Pulmonic valve regurgitation is not visualized. No evidence of pulmonic stenosis. Aorta: The aortic root and ascending aorta are structurally normal, with no evidence of dilitation. Venous: The inferior vena cava is normal in size with greater than 50% respiratory variability, suggesting right atrial pressure of 3 mmHg. IAS/Shunts: The atrial septum is grossly normal.  LEFT VENTRICLE PLAX 2D LVIDd:         5.75 cm  Diastology LVIDs:         4.10 cm  LV e' medial:    6.64 cm/s LV PW:         1.20 cm  LV E/e' medial:  17.8 LV IVS:        1.10 cm  LV e' lateral:   10.30 cm/s LVOT diam:     2.00 cm  LV E/e' lateral: 11.5 LV SV:         63 LV SV Index:   30 LVOT Area:     3.14 cm  RIGHT VENTRICLE             IVC RV Basal diam:  3.50 cm     IVC diam: 1.70 cm RV S prime:     14.40 cm/s TAPSE (M-mode): 2.5 cm LEFT ATRIUM             Index       RIGHT ATRIUM           Index LA diam:        4.70 cm 2.28 cm/m  RA Area:     12.00 cm LA Vol (A2C):   75.6 ml 36.69 ml/m RA Volume:   26.50 ml  12.86 ml/m LA Vol (A4C):   94.6 ml 45.91 ml/m LA Biplane Vol: 85.5 ml 41.50 ml/m  AORTIC VALVE AV Area (Vmax):    1.37  cm AV Area (Vmean):   1.39 cm AV Area (VTI):     1.43 cm AV Vmax:           231.00 cm/s AV Vmean:          155.000 cm/s AV VTI:            0.436 m AV Peak Grad:      21.3 mmHg AV Mean Grad:      11.0 mmHg LVOT Vmax:         101.00 cm/s LVOT Vmean:        68.700 cm/s LVOT VTI:          0.199 m LVOT/AV VTI ratio: 0.46  AORTA Ao Root diam: 3.30 cm Ao Asc diam:  2.70 cm MITRAL VALVE MV Area (PHT): 6.71 cm     SHUNTS MV Decel Time: 113 msec     Systemic VTI:  0.20 m MV E velocity: 118.00 cm/s  Systemic Diam: 2.00 cm MV A velocity: 118.00 cm/s MV E/A ratio:  1.00 Rudean Haskell MD Electronically signed by Rudean Haskell MD Signature Date/Time: 11/02/2020/3:11:02 PM    Final         Scheduled Meds:  apixaban  5 mg Oral BID   clopidogrel  75 mg Oral Daily   diltiazem  30 mg Oral Q8H   doxycycline  100 mg Oral Q12H   guaiFENesin  1,200 mg Oral BID   ipratropium-albuterol  3 mL Nebulization TID   isosorbide mononitrate  30 mg Oral Daily   loratadine  10 mg Oral Daily   methylPREDNISolone (SOLU-MEDROL) injection  60 mg Intravenous Q8H   nicotine  21 mg Transdermal Daily   rosuvastatin  20 mg Oral Daily   sertraline  50 mg Oral Daily   sodium chloride flush  3 mL Intravenous Q12H   umeclidinium-vilanterol  1 puff Inhalation Daily   vitamin B-12  100 mcg Oral Daily   Continuous Infusions:  sodium chloride       LOS: 3 days    Time spent: 56min  Domenic Polite, MD [facility]   If 7PM-7AM, please contact night-coverage [email or web address]  11/03/2020, 2:07 PM

## 2020-11-04 ENCOUNTER — Other Ambulatory Visit: Payer: Self-pay | Admitting: Physician Assistant

## 2020-11-04 ENCOUNTER — Inpatient Hospital Stay (HOSPITAL_COMMUNITY): Payer: Medicare Other

## 2020-11-04 DIAGNOSIS — I2510 Atherosclerotic heart disease of native coronary artery without angina pectoris: Secondary | ICD-10-CM

## 2020-11-04 DIAGNOSIS — I10 Essential (primary) hypertension: Secondary | ICD-10-CM

## 2020-11-04 DIAGNOSIS — I739 Peripheral vascular disease, unspecified: Secondary | ICD-10-CM | POA: Diagnosis not present

## 2020-11-04 DIAGNOSIS — Z720 Tobacco use: Secondary | ICD-10-CM | POA: Diagnosis not present

## 2020-11-04 DIAGNOSIS — I480 Paroxysmal atrial fibrillation: Secondary | ICD-10-CM

## 2020-11-04 LAB — BASIC METABOLIC PANEL
Anion gap: 6 (ref 5–15)
BUN: 26 mg/dL — ABNORMAL HIGH (ref 8–23)
CO2: 30 mmol/L (ref 22–32)
Calcium: 8.7 mg/dL — ABNORMAL LOW (ref 8.9–10.3)
Chloride: 101 mmol/L (ref 98–111)
Creatinine, Ser: 0.95 mg/dL (ref 0.61–1.24)
GFR, Estimated: >60 mL/min (ref >60)
Glucose, Bld: 183 mg/dL — ABNORMAL HIGH (ref 70–99)
Potassium: 4.7 mmol/L (ref 3.5–5.1)
Sodium: 137 mmol/L (ref 135–145)

## 2020-11-04 LAB — CBC
HCT: 48 % (ref 39.0–52.0)
Hemoglobin: 16.5 g/dL (ref 13.0–17.0)
MCH: 34.2 pg — ABNORMAL HIGH (ref 26.0–34.0)
MCHC: 34.4 g/dL (ref 30.0–36.0)
MCV: 99.4 fL (ref 80.0–100.0)
Platelets: 164 10*3/uL (ref 150–400)
RBC: 4.83 MIL/uL (ref 4.22–5.81)
RDW: 12.4 % (ref 11.5–15.5)
WBC: 15.7 10*3/uL — ABNORMAL HIGH (ref 4.0–10.5)
nRBC: 0 % (ref 0.0–0.2)

## 2020-11-04 MED ORDER — ONDANSETRON HCL 4 MG/2ML IJ SOLN: 4.0000 mg | Freq: Four times a day (QID) | INTRAMUSCULAR | Status: DC | PRN

## 2020-11-04 MED ORDER — PANTOPRAZOLE SODIUM 40 MG IV SOLR
40.0000 mg | Freq: Two times a day (BID) | INTRAVENOUS | Status: DC
  Administered 2020-11-04 – 2020-11-07 (×8): 40 mg via INTRAVENOUS
  Filled 2020-11-04 (×8): qty 40

## 2020-11-04 MED ORDER — IOHEXOL 9 MG/ML PO SOLN
ORAL | Status: AC
  Filled 2020-11-04: qty 1000

## 2020-11-04 MED ORDER — BISACODYL 10 MG RE SUPP
10.0000 mg | Freq: Once | RECTAL | Status: AC
  Administered 2020-11-04: 10 mg via RECTAL
  Filled 2020-11-04: qty 1

## 2020-11-04 MED ORDER — SODIUM CHLORIDE 0.9 % IV SOLN: INTRAVENOUS | Status: AC

## 2020-11-04 MED ORDER — ONDANSETRON HCL 4 MG/2ML IJ SOLN
4.0000 mg | Freq: Four times a day (QID) | INTRAMUSCULAR | Status: DC | PRN
  Administered 2020-11-04: 4 mg via INTRAVENOUS
  Filled 2020-11-04: qty 2

## 2020-11-04 MED ORDER — SODIUM CHLORIDE 0.9 % IV SOLN
12.5000 mg | Freq: Four times a day (QID) | INTRAVENOUS | Status: DC | PRN
  Administered 2020-11-04 (×2): 12.5 mg via INTRAVENOUS
  Filled 2020-11-04 (×3): qty 0.5

## 2020-11-04 MED ORDER — ALUM & MAG HYDROXIDE-SIMETH 200-200-20 MG/5ML PO SUSP
15.0000 mL | Freq: Four times a day (QID) | ORAL | Status: DC | PRN
  Administered 2020-11-04: 15 mL via ORAL
  Filled 2020-11-04: qty 30

## 2020-11-04 MED ORDER — PANTOPRAZOLE SODIUM 40 MG PO TBEC: 40.0000 mg | DELAYED_RELEASE_TABLET | Freq: Two times a day (BID) | ORAL | Status: DC

## 2020-11-04 MED ORDER — DIPHENHYDRAMINE HCL 50 MG/ML IJ SOLN: 50.0000 mg | Freq: Once | INTRAMUSCULAR | Status: DC

## 2020-11-04 MED ORDER — DILTIAZEM HCL ER COATED BEADS 120 MG PO CP24
120.0000 mg | ORAL_CAPSULE | Freq: Every day | ORAL | Status: DC
  Administered 2020-11-04 – 2020-11-09 (×6): 120 mg via ORAL
  Filled 2020-11-04 (×6): qty 1

## 2020-11-04 MED ORDER — DIPHENHYDRAMINE HCL 25 MG PO CAPS: 50.0000 mg | ORAL_CAPSULE | Freq: Once | ORAL | Status: DC

## 2020-11-04 MED ORDER — METHYLPREDNISOLONE SODIUM SUCC 40 MG IJ SOLR
40.0000 mg | Freq: Two times a day (BID) | INTRAMUSCULAR | Status: DC
  Administered 2020-11-04 – 2020-11-05 (×3): 40 mg via INTRAVENOUS
  Filled 2020-11-04 (×3): qty 1

## 2020-11-04 NOTE — Progress Notes (Signed)
PROGRESS NOTE    Brady Brooks.  INO:[id number] DOB: [birth year] DOA: 10/31/2020 PCP: Billie Ruddy, MD   Brief Narrative: 70 year old with past medical history significant for tobacco abuse, COPD, seasonal allergies, PVD/CAD who presents with 1 or 2 weeks progressive worsening shortness of breath and chest pain/tightness. Patient was admitted for COPD exacerbation. -Had intermittent chest pain this admission, 2D echo noted EF of 40-45% with wall motion abnormalities, cardiology consulted, recommended outpatient ischemic evaluation -Overnight 7/6 -7/7 with abdominal distention, bloating, vomiting   Assessment & Plan:   Acute COPD exacerbation.  Acute hypoxic respiratory failure -Clinically improving, less wheezing today -Continue IV Solu-Medrol, discontinue doxycycline, continue duo nebs -Check ambulatory O2 sats prior to DC  History of CAD, chest tightness, PVCs Demand ischemia -Troponin elevation, 2D echo with EF of 40-45% with regional wall motion abnormalities -Cardiology consulted, recommended ischemic evaluation likely cath in the near future after he has recovered from acute COPD exacerbation -Continue Plavix and Crestor  Abdominal pain, nausea, vomiting, distention -Concerning for P SBO versus ileus -Also has mild left lower quadrant tenderness on exam, history of diverticulitis as well -Start gentle IV fluids, downgrade to clears, check CT abdomen pelvis -May need NG decompression, also needs premedication for CT with contrast  Paroxysmal atrial fibrillation -Intermittent episodes of A. fib, PVCs -Started on low-dose diltiazem for rate control by cards, cannot tolerate beta-blocker due to active COPD exacerbation -Started Eliquis  Depression;  -Appreciate psych input, continue Zoloft and nicotine patch  Tobacco abuse -100-pack-year smoking history -Counsel, nicotine patch  Macrocytosis:  -B 12  320. Started B 12 supplement.    Estimated body mass  index is 27.84 kg/m as calculated from the following:   Height as of this encounter: 5\' 10"  (1.778 m).   Weight as of this encounter: 88 kg.   DVT prophylaxis: Lovenox Code Status: Full Code Family Communication: Discussed with wife at bedside Disposition Plan:  Status is: Inpatient  Remains inpatient appropriate because:IV treatments appropriate due to intensity of illness or inability to take PO  Dispo: The patient is from: Home              Anticipated d/c is to: Home              Patient currently is not medically stable to d/c.   Difficult to place patient No   Consultants:  Cardiology   Procedures:  None  Antimicrobials:    Subjective: -Overnight with abdominal pain, distention, vomiting this morning   Objective: Vitals:   11/03/20 1320 11/03/20 1802 11/04/20 0029 11/04/20 0500  BP:  (!) 147/66 139/88 135/73  Pulse:  73 62 71  Resp:  16 18 18   Temp:  98.8 F (37.1 C) 98.5 F (36.9 C) 98.1 F (36.7 C)  TempSrc:  Oral Oral Oral  SpO2: 94% 91% 95% 97%  Weight:      Height:       No intake or output data in the 24 hours ending 11/04/20 1232  Filed Weights   10/31/20 0229  Weight: 88 kg    Examination: Gen: Awake, Alert, Oriented X 3,  HEENT: no JVD Lungs: Improving air movement, few expiratory wheezes CVS: S1S2/RRR Abd: Soft, mild left lower quadrant tenderness, mildly distended, bowel sounds increased Extremities: No edema Skin: no new rashes on exposed skin     Data Reviewed: I have personally reviewed following labs and imaging studies  CBC: Recent Labs  Lab 10/31/20 0224 10/31/20 1018 11/02/20 0334 11/03/20 0416 11/04/20  0141  WBC 14.6*  --  13.1* 16.3* 15.7*  HGB 16.7 17.0 15.9 16.3 16.5  HCT 49.4 50.0 46.9 47.4 48.0  MCV 102.9*  --  102.0* 100.2* 99.4  PLT 163  --  161 152 194   Basic Metabolic Panel: Recent Labs  Lab 10/31/20 0224 10/31/20 1018 11/01/20 0022 11/02/20 0334 11/03/20 0416 11/04/20 0141  NA 136 139 139  139 138 137  K 4.6 4.3 4.3 4.6 4.5 4.7  CL 103  --  105 102 106 101  CO2 23  --  27 28 26 30   GLUCOSE 148*  --  131* 148* 184* 183*  BUN 17  --  25* 20 22 26*  CREATININE 0.88  --  1.10 0.80 0.79 0.95  CALCIUM 8.7*  --  9.1 8.7* 8.6* 8.7*  MG 2.0  --   --   --   --   --    GFR: Estimated Creatinine Clearance: 82 mL/min (by C-G formula based on SCr of 0.95 mg/dL). Liver Function Tests: No results for input(s): AST, ALT, ALKPHOS, BILITOT, PROT, ALBUMIN in the last 168 hours. No results for input(s): LIPASE, AMYLASE in the last 168 hours. No results for input(s): AMMONIA in the last 168 hours. Coagulation Profile: No results for input(s): INR, PROTIME in the last 168 hours. Cardiac Enzymes: No results for input(s): CKTOTAL, CKMB, CKMBINDEX, TROPONINI in the last 168 hours. BNP (last 3 results) No results for input(s): PROBNP in the last 8760 hours. HbA1C: No results for input(s): HGBA1C in the last 72 hours. CBG: No results for input(s): GLUCAP in the last 168 hours. Lipid Profile: No results for input(s): CHOL, HDL, LDLCALC, TRIG, CHOLHDL, LDLDIRECT in the last 72 hours. Thyroid Function Tests: No results for input(s): TSH, T4TOTAL, FREET4, T3FREE, THYROIDAB in the last 72 hours. Anemia Panel: Recent Labs    11/02/20 0721  VITAMINB12 320  FOLATE 7.8   Sepsis Labs: No results for input(s): PROCALCITON, LATICACIDVEN in the last 168 hours.  Recent Results (from the past 240 hour(s))  SARS CORONAVIRUS 2 (TAT 6-24 HRS) Nasopharyngeal Nasopharyngeal Swab     Status: None   Collection Time: 10/31/20  2:44 AM   Specimen: Nasopharyngeal Swab  Result Value Ref Range Status   SARS Coronavirus 2 NEGATIVE NEGATIVE Final    Comment: (NOTE) SARS-CoV-2 target nucleic acids are NOT DETECTED.  The SARS-CoV-2 RNA is generally detectable in upper and lower respiratory specimens during the acute phase of infection. Negative results do not preclude SARS-CoV-2 infection, do not rule  out co-infections with other pathogens, and should not be used as the sole basis for treatment or other patient management decisions. Negative results must be combined with clinical observations, patient history, and epidemiological information. The expected result is Negative.  Fact Sheet for Patients: SugarRoll.be  Fact Sheet for Healthcare Providers: https://www.woods-mathews.com/  This test is not yet approved or cleared by the [place] FDA and  has been authorized for detection and/or diagnosis of SARS-CoV-2 by FDA under an Emergency Use Authorization (EUA). This EUA will remain  in effect (meaning this test can be used) for the duration of the COVID-19 declaration under Se ction 564(b)(1) of the Act, 21 U.S.C. section 360bbb-3(b)(1), unless the authorization is terminated or revoked sooner.  Performed at [facility], [place] [address] [place], [place] [address]   Resp Panel by RT-PCR (Flu A&B, Covid) Nasopharyngeal Swab     Status: None   Collection Time: 10/31/20 10:32 AM   Specimen:  Nasopharyngeal Swab; Nasopharyngeal(NP) swabs in vial transport medium  Result Value Ref Range Status   SARS Coronavirus 2 by RT PCR NEGATIVE NEGATIVE Final    Comment: (NOTE) SARS-CoV-2 target nucleic acids are NOT DETECTED.  The SARS-CoV-2 RNA is generally detectable in upper respiratory specimens during the acute phase of infection. The lowest concentration of SARS-CoV-2 viral copies this assay can detect is 138 copies/mL. A negative result does not preclude SARS-Cov-2 infection and should not be used as the sole basis for treatment or other patient management decisions. A negative result may occur with  improper specimen collection/handling, submission of specimen other than nasopharyngeal swab, presence of viral mutation(s) within the areas targeted by this assay, and inadequate number of viral copies(<138 copies/mL). A negative result  must be combined with clinical observations, patient history, and epidemiological information. The expected result is Negative.  Fact Sheet for Patients:  EntrepreneurPulse.com.au  Fact Sheet for Healthcare Providers:  IncredibleEmployment.be  This test is no t yet approved or cleared by the [place] FDA and  has been authorized for detection and/or diagnosis of SARS-CoV-2 by FDA under an Emergency Use Authorization (EUA). This EUA will remain  in effect (meaning this test can be used) for the duration of the COVID-19 declaration under Section 564(b)(1) of the Act, 21 U.S.C.section 360bbb-3(b)(1), unless the authorization is terminated  or revoked sooner.       Influenza A by PCR NEGATIVE NEGATIVE Final   Influenza B by PCR NEGATIVE NEGATIVE Final    Comment: (NOTE) The Xpert Xpress SARS-CoV-2/FLU/RSV plus assay is intended as an aid in the diagnosis of influenza from Nasopharyngeal swab specimens and should not be used as a sole basis for treatment. Nasal washings and aspirates are unacceptable for Xpert Xpress SARS-CoV-2/FLU/RSV testing.  Fact Sheet for Patients: EntrepreneurPulse.com.au  Fact Sheet for Healthcare Providers: IncredibleEmployment.be  This test is not yet approved or cleared by the [place] FDA and has been authorized for detection and/or diagnosis of SARS-CoV-2 by FDA under an Emergency Use Authorization (EUA). This EUA will remain in effect (meaning this test can be used) for the duration of the COVID-19 declaration under Section 564(b)(1) of the Act, 21 U.S.C. section 360bbb-3(b)(1), unless the authorization is terminated or revoked.  Performed at [facility], [place] [address], [place], [place] [address]          Radiology Studies: DG Abd 1 View  Result Date: 11/04/2020 CLINICAL DATA:  70 year old male with history of left lower quadrant abdominal pain.  Evaluate for ileus versus constipation. EXAM: ABDOMEN - 1 VIEW COMPARISON:  No priors. FINDINGS: Multiple dilated loops of small bowel measuring up to 4.7 cm in diameter. Gaseous distension of the stomach. There is gas and stool present throughout the colon and distal rectum. No gross pneumoperitoneum noted on these supine images. Surgical clips project over the right upper quadrant of the abdomen, compatible with prior cholecystectomy. IMPRESSION: 1. Nonspecific bowel gas pattern, as above. Findings may suggest an ileus in the appropriate clinical setting, however, the possibility of an early or partial small bowel obstruction is not excluded. Electronically Signed   By: Vinnie Langton M.D.   On: 11/04/2020 06:50        Scheduled Meds:  apixaban  5 mg Oral BID   clopidogrel  75 mg Oral Daily   diltiazem  120 mg Oral Daily   diphenhydrAMINE  50 mg Oral Once   Or   diphenhydrAMINE  50 mg Intravenous Once   guaiFENesin  1,200 mg Oral BID   ipratropium-albuterol  3 mL Nebulization TID   isosorbide mononitrate  30 mg Oral Daily   loratadine  10 mg Oral Daily   methylPREDNISolone (SOLU-MEDROL) injection  40 mg Intravenous Q12H   nicotine  21 mg Transdermal Daily   pantoprazole (PROTONIX) IV  40 mg Intravenous Q12H   rosuvastatin  20 mg Oral Daily   sertraline  50 mg Oral Daily   sodium chloride flush  3 mL Intravenous Q12H   umeclidinium-vilanterol  1 puff Inhalation Daily   vitamin B-12  100 mcg Oral Daily   Continuous Infusions:  sodium chloride     sodium chloride 75 mL/hr at 11/04/20 1119   promethazine (PHENERGAN) injection (IM or IVPB) 12.5 mg (11/04/20 1120)     LOS: 4 days    Time spent: 4min  Domenic Polite, MD [facility]  11/04/2020, 12:32 PM

## 2020-11-04 NOTE — Progress Notes (Signed)
    Patient needs ZIO monitor at discharge to assess A. fib burden.  The monitor is ordered.   Attending MD, please contact cardiology or [facility] at [phone] get the monitor applied on the day of discharge.  I have not contacted them because I am not clear on when the patient might be discharged.  Cardiology signing off, see Dr. Jacolyn Reedy note for medication recommendations.  Rosaria Ferries, PA-C 11/04/2020 11:52 AM

## 2020-11-04 NOTE — Progress Notes (Signed)
Pt vomited a small amount of brown emesis and RN gave zofran, Wife was rude this AM and stated that she wanted the emesis tested for blood and that she was saving it for the MD. RN text paged MD and notified.

## 2020-11-04 NOTE — Discharge Instructions (Addendum)

## 2020-11-04 NOTE — Progress Notes (Signed)
Provider paged and made aware patient experiencing abdominal pain in left lower quadrant and bloating. Abdominal Pt stated given PRN Maalox with some relief. Pt sated he has a hx of diverticulitis and it feels like a flare up. Provider ordered abdominal xray.

## 2020-11-04 NOTE — Progress Notes (Signed)
**Note Brady-Identified via Obfuscation** Progress Note  Patient Name: Brady THAU Sr. Date of Encounter: 11/04/2020  [facility] Cardiologist: [name], MD   Subjective   Had nausea, vomiting and abdominal pain overnight. Abdominal xray concerning for possible ileus. CT scan pending.  Otherwise, no chest pain or palpitations. Continues to have SOB with the abdominal discomfort   Inpatient Medications    Scheduled Meds:  apixaban  5 mg Oral BID   bisacodyl  10 mg Rectal Once   clopidogrel  75 mg Oral Daily   diltiazem  30 mg Oral Q8H   diphenhydrAMINE  50 mg Oral Once   Or   diphenhydrAMINE  50 mg Intravenous Once   guaiFENesin  1,200 mg Oral BID   ipratropium-albuterol  3 mL Nebulization TID   isosorbide mononitrate  30 mg Oral Daily   loratadine  10 mg Oral Daily   methylPREDNISolone (SOLU-MEDROL) injection  40 mg Intravenous Q12H   nicotine  21 mg Transdermal Daily   pantoprazole (PROTONIX) IV  40 mg Intravenous Q12H   rosuvastatin  20 mg Oral Daily   sertraline  50 mg Oral Daily   sodium chloride flush  3 mL Intravenous Q12H   umeclidinium-vilanterol  1 puff Inhalation Daily   vitamin B-12  100 mcg Oral Daily   Continuous Infusions:  sodium chloride     sodium chloride     promethazine (PHENERGAN) injection (IM or IVPB)     PRN Meds: sodium chloride, acetaminophen **OR** acetaminophen, albuterol, alum & mag hydroxide-simeth, ondansetron (ZOFRAN) IV, polyethylene glycol, promethazine (PHENERGAN) injection (IM or IVPB), sodium chloride flush   Vital Signs    Vitals:   11/03/20 1320 11/03/20 1802 11/04/20 0029 11/04/20 0500  BP:  (!) 147/66 139/88 135/73  Pulse:  73 62 71  Resp:  16 18 18   Temp:  98.8 F (37.1 C) 98.5 F (36.9 C) 98.1 F (36.7 C)  TempSrc:  Oral Oral Oral  SpO2: 94% 91% 95% 97%  Weight:      Height:       No intake or output data in the 24 hours ending 11/04/20 1115 Last 3 Weights 10/31/2020 08/06/2020 02/12/2020  Weight (lbs) 194 lb 194 lb 12.8 oz 192 lb  Weight  (kg) 87.998 kg 88.361 kg 87.091 kg      Telemetry    NSR, brief runs of SVT - Personally Reviewed  ECG    No new tracing - Personally Reviewed  Physical Exam   GEN: No acute distress.   Neck: No JVD Cardiac: RRR, no murmurs, rubs, or gallops.  Respiratory: Diminished, faint wheezes GI: Soft, mildly distended, diminished bowel sounds MS: No edema; No deformity. Neuro:  Nonfocal  Psych: Normal affect   Labs    High Sensitivity Troponin:   Recent Labs  Lab 10/31/20 0224 10/31/20 0444 10/31/20 1119 10/31/20 1702  TROPONINIHS 42* 44* 35* 34*      Chemistry Recent Labs  Lab 11/02/20 0334 11/03/20 0416 11/04/20 0141  NA 139 138 137  K 4.6 4.5 4.7  CL 102 106 101  CO2 28 26 30   GLUCOSE 148* 184* 183*  BUN 20 22 26*  CREATININE 0.80 0.79 0.95  CALCIUM 8.7* 8.6* 8.7*  GFRNONAA >60 >60 >60  ANIONGAP 9 6 6      Hematology Recent Labs  Lab 11/02/20 0334 11/03/20 0416 11/04/20 0141  WBC 13.1* 16.3* 15.7*  RBC 4.60 4.73 4.83  HGB 15.9 16.3 16.5  HCT 46.9 47.4 48.0  MCV 102.0* 100.2* 99.4  MCH  34.6* 34.5* 34.2*  MCHC 33.9 34.4 34.4  RDW 12.9 12.5 12.4  PLT 161 152 164    BNPNo results for input(s): BNP, PROBNP in the last 168 hours.   DDimer  Recent Labs  Lab 11/01/20 1138  DDIMER 0.85*     Radiology    DG Abd 1 View  Result Date: 11/04/2020 CLINICAL DATA:  70 year old male with history of left lower quadrant abdominal pain. Evaluate for ileus versus constipation. EXAM: ABDOMEN - 1 VIEW COMPARISON:  No priors. FINDINGS: Multiple dilated loops of small bowel measuring up to 4.7 cm in diameter. Gaseous distension of the stomach. There is gas and stool present throughout the colon and distal rectum. No gross pneumoperitoneum noted on these supine images. Surgical clips project over the right upper quadrant of the abdomen, compatible with prior cholecystectomy. IMPRESSION: 1. Nonspecific bowel gas pattern, as above. Findings may suggest an ileus in the  appropriate clinical setting, however, the possibility of an early or partial small bowel obstruction is not excluded. Electronically Signed   By: Vinnie Langton M.D.   On: 11/04/2020 06:50   ECHOCARDIOGRAM COMPLETE  Result Date: 11/02/2020    ECHOCARDIOGRAM REPORT   Patient Name:   Brady JAWORSKI Sr. Date of Exam: 11/02/2020 Medical Rec #:  [id number]             Height:       70.0 in Accession #:    [id number]            Weight:       194.0 lb Date of Birth:  Jan 04, 1951             BSA:          2.060 m Patient Age:    70 years              BP:           130/81 mmHg Patient Gender: M                     HR:           67 bpm. Exam Location:  Inpatient Procedure: 2D Echo, Cardiac Doppler and Color Doppler Indications:    Chest pain  History:        Patient has no prior history of Echocardiogram examinations.                 CAD, COPD; Risk Factors:Current Smoker and Hypertension.  Sonographer:    Cammy Brochure Referring Phys: [id number] HAO MENG  Sonographer Comments: Image acquisition challenging due to COPD and Image acquisition challenging due to patient body habitus. IMPRESSIONS  1. Left ventricular ejection fraction, by estimation, is 40 to 45%. The left ventricle has mildly decreased function. The left ventricle demonstrates regional wall motion abnormalities (see scoring diagram/findings for description). The left ventricular  internal cavity size was moderately dilated. There is mild concentric left ventricular hypertrophy. Left ventricular diastolic parameters are consistent with Grade II diastolic dysfunction (pseudonormalization).  2. Right ventricular systolic function is normal. The right ventricular size is normal. Tricuspid regurgitation signal is inadequate for assessing PA pressure.  3. Left atrial size was moderately dilated.  4. The mitral valve is grossly normal. No evidence of mitral valve regurgitation. No evidence of mitral stenosis.  5. The aortic valve is calcified. There is mild  calcification of the aortic valve. There is moderate thickening of the aortic valve. Aortic valve regurgitation is not visualized. Mild to moderate  aortic valve sclerosis/calcification is present, without any evidence of aortic stenosis.  6. The inferior vena cava is normal in size with greater than 50% respiratory variability, suggesting right atrial pressure of 3 mmHg. Comparison(s): No prior Echocardiogram. FINDINGS  Left Ventricle: Left ventricular ejection fraction, by estimation, is 40 to 45%. The left ventricle has mildly decreased function. The left ventricle demonstrates regional wall motion abnormalities. The left ventricular internal cavity size was moderately dilated. There is mild concentric left ventricular hypertrophy. Left ventricular diastolic parameters are consistent with Grade II diastolic dysfunction (pseudonormalization).  LV Wall Scoring: The antero-lateral wall and posterior wall are hypokinetic. Right Ventricle: The right ventricular size is normal. No increase in right ventricular wall thickness. Right ventricular systolic function is normal. Tricuspid regurgitation signal is inadequate for assessing PA pressure. Left Atrium: Left atrial size was moderately dilated. Right Atrium: Right atrial size was normal in size. Pericardium: There is no evidence of pericardial effusion. Mitral Valve: The mitral valve is grossly normal. There is moderate thickening of the mitral valve leaflet(s). Mild mitral annular calcification. No evidence of mitral valve regurgitation. No evidence of mitral valve stenosis. Tricuspid Valve: The tricuspid valve is normal in structure. Tricuspid valve regurgitation is not demonstrated. No evidence of tricuspid stenosis. Aortic Valve: The aortic valve is calcified. There is mild calcification of the aortic valve. There is moderate thickening of the aortic valve. Aortic valve regurgitation is not visualized. Mild to moderate aortic valve sclerosis/calcification is  present, without any evidence of aortic stenosis. Aortic valve mean gradient measures 11.0 mmHg. Aortic valve peak gradient measures 21.3 mmHg. Aortic valve area, by VTI measures 1.43 cm. Pulmonic Valve: The pulmonic valve was not well visualized. Pulmonic valve regurgitation is not visualized. No evidence of pulmonic stenosis. Aorta: The aortic root and ascending aorta are structurally normal, with no evidence of dilitation. Venous: The inferior vena cava is normal in size with greater than 50% respiratory variability, suggesting right atrial pressure of 3 mmHg. IAS/Shunts: The atrial septum is grossly normal.  LEFT VENTRICLE PLAX 2D LVIDd:         5.75 cm  Diastology LVIDs:         4.10 cm  LV e' medial:    6.64 cm/s LV PW:         1.20 cm  LV E/e' medial:  17.8 LV IVS:        1.10 cm  LV e' lateral:   10.30 cm/s LVOT diam:     2.00 cm  LV E/e' lateral: 11.5 LV SV:         63 LV SV Index:   30 LVOT Area:     3.14 cm  RIGHT VENTRICLE             IVC RV Basal diam:  3.50 cm     IVC diam: 1.70 cm RV S prime:     14.40 cm/s TAPSE (M-mode): 2.5 cm LEFT ATRIUM             Index       RIGHT ATRIUM           Index LA diam:        4.70 cm 2.28 cm/m  RA Area:     12.00 cm LA Vol (A2C):   75.6 ml 36.69 ml/m RA Volume:   26.50 ml  12.86 ml/m LA Vol (A4C):   94.6 ml 45.91 ml/m LA Biplane Vol: 85.5 ml 41.50 ml/m  AORTIC VALVE AV Area (Vmax):  1.37 cm AV Area (Vmean):   1.39 cm AV Area (VTI):     1.43 cm AV Vmax:           231.00 cm/s AV Vmean:          155.000 cm/s AV VTI:            0.436 m AV Peak Grad:      21.3 mmHg AV Mean Grad:      11.0 mmHg LVOT Vmax:         101.00 cm/s LVOT Vmean:        68.700 cm/s LVOT VTI:          0.199 m LVOT/AV VTI ratio: 0.46  AORTA Ao Root diam: 3.30 cm Ao Asc diam:  2.70 cm MITRAL VALVE MV Area (PHT): 6.71 cm     SHUNTS MV Decel Time: 113 msec     Systemic VTI:  0.20 m MV E velocity: 118.00 cm/s  Systemic Diam: 2.00 cm MV A velocity: 118.00 cm/s MV E/A ratio:  1.00 Rudean Haskell MD Electronically signed by Rudean Haskell MD Signature Date/Time: 11/02/2020/3:11:02 PM    Final     Cardiac Studies   ECHO: 11/02/2020  1. Left ventricular ejection fraction, by estimation, is 40 to 45%. The  left ventricle has mildly decreased function. The left ventricle  demonstrates regional wall motion abnormalities (see scoring  diagram/findings for description). The left ventricular   internal cavity size was moderately dilated. There is mild concentric  left ventricular hypertrophy. Left ventricular diastolic parameters are  consistent with Grade II diastolic dysfunction (pseudonormalization).   2. Right ventricular systolic function is normal. The right ventricular  size is normal. Tricuspid regurgitation signal is inadequate for assessing PA pressure.   3. Left atrial size was moderately dilated. R atrium normal in size  4. The mitral valve is grossly normal. No evidence of mitral valve  regurgitation. No evidence of mitral stenosis.   5. The aortic valve is calcified. There is mild calcification of the  aortic valve. There is moderate thickening of the aortic valve. Aortic  valve regurgitation is not visualized. Mild to moderate aortic valve  sclerosis/calcification is present, without  any evidence of aortic stenosis.   6. The inferior vena cava is normal in size with greater than 50%  respiratory variability, suggesting right atrial pressure of 3 mmHg.   Cath 10/15/2003 CORONARY ARTERIOGRAPHY:  Calcification was noted in the proximal LAD on  fluoroscopy.    1. Left main normal.  2. LAD:  The left anterior descending crossed the apex of the heart and had mild irregularities in the mid portion.  The first diagonal also had mild irregularities.  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  3. Circumflex:  The circumflex had moderate irregularities in the mid     portion just before it  became 100% occluded.  There were two very small OM vessels that came off the proximal portion.  What appears to be a relatively large third OM was totally occluded, but filled retrograde and late via left-to-left collaterals (this is unchanged from 2001 cath).  4. Right coronary artery:  The right coronary artery is a dominant vessel giving rise to PDA and posterior lateral branch.  The proximal portion of the right coronary artery had an eccentric, but focal area of 90% narrowing.  The mid and distal vessel were free of disease.  CONCLUSION:  1. 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  2. Mild irregularities in the left anterior descending.  3. High grade stenosis in the proximal right coronary artery.  4. Normal left ventricular function.  5. Apparent restenosis within the right iliac stent.    Because of the high grade stenosis in the right coronary artery,  arrangements were made for intervention.  The 5 French sheath was exchanged out for 6 [place] sheath.  JR-4 guide catheter with side holes was used.  A short Luge wire was placed down the distal right coronary artery and a 2.5 x 9 Maverick balloon was used for dilatation at 8 atmospheres x 45.  Two inflations were performed.  Following this, a 3.0 x 16 Taxus stent was placed in such a manner that both the proximal and distal portion was well covered.  It was deployed initially at 13 atmospheres for 53 seconds.  The  final inflation at 15 atmospheres for 55 seconds.    The 90% area of the proximal right coronary artery before intervention is now less than 10% narrowed.  There was no evidence of any dissection or thrombus or distal embolization.  There was TIMI-3 flow pre and post intervention.    The patient was maintained on heparin with therapeutic ACTs with final ACT at the end of the procedure being 233.  Integrilin infusion was also performed and will be carried out for additional 12  hours.    The patient was given 300 mg of oral Plavix and he should be ready for discharge in the morning.  His LDL is 70 and the only untreated risk factor is his continued cigarette abuse which I discussed in detail with the patient.  Patient Profile     70 y.o. male with a hx of CAD s/p RCA PCI and occluded Lcx in 2005, PAD, COPD, HTN,and tobacco use who presented with progressive SOB and chest pain found to have acute COPD exacerbation. Cardiology is consulted for further management of chest pain.  Assessment & Plan    #Acute COPD exacerbation: #Chest Pain and DOE: Suspect primary driver of acute presentation is acute COPD exacerbation. Symptoms overall improving with improved respiratory status.  -Management per primary  #CAD s/p RCA PCI and known occluded Lcx in 2005 #HF with mildly reduced LVEF Suspect chest pain on admission related to acute COPD exacerbation as detailed above. Now resolved. Trop flat. TTE 11/02/20 with LVEF 40-45% with anterolateral and inferior wall hypokinesis, G2DD, mild LVH. Notably, has known occluded Lcx and prior RCA stent. Per patient's wife, did not have WMA or dropped EF on prior echoes post cath (no prior in our system) Unable to perform myoview due to acute COPD flare. Will plan for ischemic evaluation on outpatient basis. -Once recovered from COPD flare, will need ischemic evaluation (likely with cath) as out-patient -Unable to perform myoview as in-patient due to active COPD flare -Continue plavix 75mg  daily and crestor 20mg  daily for known CAD and PAD (has allergy to ASA) -Continue imdur 30mg  daily -No BB due to active wheezing  #Newly Diagnosed Afib: #SVT: CHADs-vasc 4. Noted on the monitor. Started on apixaban and dilt. -Continue apixaban 5mg  BID -Change dilt to long acting 120mg  daily (not ideal agent due to mildly reduced LVEF but cannot tolerate BB due to active COPD flare); can transition to BB as out-patient pending continued improvement from  COPD perspective -Zio on discharge  #Abdominal pain, nausea, vomiting: #Concern for ileus: Developed overnight.  Abdominal xray concerning for possible ileus. CT scan pending. -Follow-up CT scan -Management per primary  #PAD: -Continue plavix 75mg  daily and crestor 20mg  daily as above  #Tobacco Abuse: Recently quit! -Management per IM  #HLD: -Continue crestor 20mg  daily as above  #HTN: -Transition to dilt 120mg  daily  [facility] will sign off.   Medication Recommendations:  apixaban 5mg  BID, dilt 120mg  daily, plavix, imdur and crestor Other recommendations (labs, testing, etc):  Will arrange zio monitor to assess burden of afib; plan for ischemic evaluation as out-patient Follow up as an outpatient:  Will arrange  For questions or updates, please contact Gary Please consult [email or web address] for contact info under        Signed, Freada Bergeron, MD  11/04/2020, 11:15 AM

## 2020-11-05 ENCOUNTER — Inpatient Hospital Stay (HOSPITAL_COMMUNITY): Payer: Medicare Other

## 2020-11-05 DIAGNOSIS — I739 Peripheral vascular disease, unspecified: Secondary | ICD-10-CM | POA: Diagnosis not present

## 2020-11-05 DIAGNOSIS — Z720 Tobacco use: Secondary | ICD-10-CM | POA: Diagnosis not present

## 2020-11-05 DIAGNOSIS — I10 Essential (primary) hypertension: Secondary | ICD-10-CM | POA: Diagnosis not present

## 2020-11-05 DIAGNOSIS — I2510 Atherosclerotic heart disease of native coronary artery without angina pectoris: Secondary | ICD-10-CM | POA: Diagnosis not present

## 2020-11-05 LAB — BASIC METABOLIC PANEL
Anion gap: 4 — ABNORMAL LOW (ref 5–15)
BUN: 29 mg/dL — ABNORMAL HIGH (ref 8–23)
CO2: 31 mmol/L (ref 22–32)
Calcium: 8.5 mg/dL — ABNORMAL LOW (ref 8.9–10.3)
Chloride: 101 mmol/L (ref 98–111)
Creatinine, Ser: 0.93 mg/dL (ref 0.61–1.24)
GFR, Estimated: >60 mL/min (ref >60)
Glucose, Bld: 167 mg/dL — ABNORMAL HIGH (ref 70–99)
Potassium: 5.2 mmol/L — ABNORMAL HIGH (ref 3.5–5.1)
Sodium: 136 mmol/L (ref 135–145)

## 2020-11-05 LAB — CBC
HCT: 46.1 % (ref 39.0–52.0)
Hemoglobin: 15.8 g/dL (ref 13.0–17.0)
MCH: 34.3 pg — ABNORMAL HIGH (ref 26.0–34.0)
MCHC: 34.3 g/dL (ref 30.0–36.0)
MCV: 100.2 fL — ABNORMAL HIGH (ref 80.0–100.0)
Platelets: 163 10*3/uL (ref 150–400)
RBC: 4.6 MIL/uL (ref 4.22–5.81)
RDW: 12.3 % (ref 11.5–15.5)
WBC: 15.9 10*3/uL — ABNORMAL HIGH (ref 4.0–10.5)
nRBC: 0 % (ref 0.0–0.2)

## 2020-11-05 MED ORDER — BISACODYL 10 MG RE SUPP
10.0000 mg | Freq: Once | RECTAL | Status: AC
  Administered 2020-11-05: 10 mg via RECTAL

## 2020-11-05 MED ORDER — SODIUM CHLORIDE 0.9 % IV SOLN: INTRAVENOUS | Status: AC

## 2020-11-05 NOTE — Progress Notes (Signed)
PROGRESS NOTE    JERAMYAH GOODPASTURE Sr.  YNW:[id number] DOB: 1950/10/27 DOA: 10/31/2020 PCP: Billie Ruddy, MD   Brief Narrative: 70 year old with past medical history significant for tobacco abuse, COPD, seasonal allergies, PVD/CAD who presents with 1 or 2 weeks progressive worsening shortness of breath and chest pain/tightness. Patient was admitted for COPD exacerbation. -Had intermittent chest pain this admission, 2D echo noted EF of 40-45% with wall motion abnormalities, cardiology consulted, recommended outpatient ischemic evaluation -Early a.m. 7/7 with abdominal distention, bloating, vomiting   Assessment & Plan:   Acute COPD exacerbation.  Acute hypoxic respiratory failure -Clinically improving, less wheezing today -Cut down IV Solu-Medrol, antibiotics discontinued, continue duo nebs  -Check ambulatory O2 sats prior to DC  History of CAD, chest tightness, PVCs Demand ischemia -Troponin elevation, 2D echo with EF of 40-45% with regional wall motion abnormalities -Cardiology consulted, recommended ischemic evaluation likely cath in the near future after he has recovered from acute COPD exacerbation -Continue Plavix and Crestor  PSBO -Had symptoms yesterday morning -CT yesterday evening with concerns for SBO however following his CT he had a small BM and passed large amount of flatus after which she started feeling better symptomatically without further vomiting, NG tube decompression was deferred last night -Continue IV fluids, n.p.o., will need NG decompression if he develops worsening distention or vomiting -Will request surgical eval -Ambulate  Paroxysmal atrial fibrillation -Intermittent episodes of A. fib, PVCs -Started on low-dose diltiazem for rate control by cards, cannot tolerate beta-blocker due to active COPD exacerbation -Started Eliquis  Depression;  -Appreciate psych input, continue Zoloft and nicotine patch  Tobacco abuse -100-pack-year smoking  history -Counsel, nicotine patch  Macrocytosis:  -B 12  320. Started B 12 supplement.    Estimated body mass index is 27.84 kg/m as calculated from the following:   Height as of this encounter: 5\' 10"  (1.778 m).   Weight as of this encounter: 88 kg.   DVT prophylaxis: On Eliquis Code Status: Full Code Family Communication: Discussed with wife at bedside Disposition Plan:  Status is: Inpatient  Remains inpatient appropriate because:IV treatments appropriate due to intensity of illness or inability to take PO  Dispo: The patient is from: Home              Anticipated d/c is to: Home              Patient currently is not medically stable to d/c.   Difficult to place patient No   Consultants:  Cardiology   Procedures:  None  Antimicrobials:    Subjective: -Felt better yesterday evening after a small bowel movement and flatus   Objective: Vitals:   11/04/20 2040 11/04/20 2308 11/05/20 0516 11/05/20 0711  BP:  (!) 143/74 115/73   Pulse:  69 63   Resp:  18 16   Temp:  98.9 F (37.2 C) 98.4 F (36.9 C)   TempSrc:  Oral Oral   SpO2: 94% 95% 94% 91%  Weight:      Height:        Intake/Output Summary (Last 24 hours) at 11/05/2020 1133 Last data filed at 11/05/2020 0519 Gross per 24 hour  Intake 324.91 ml  Output 500 ml  Net -175.09 ml    Filed Weights   10/31/20 0229  Weight: 88 kg    Examination: Gen: Awake alert oriented x3, no distress HEENT: No JVD Lungs: Improving air movement, occasional expiratory wheezes CVS: S1-S2, regular rate rhythm  Abdomen: Soft, less distended, nontender, bowel sounds  increased Extremities: No edema Skin: no new rashes on exposed skin     Data Reviewed: I have personally reviewed following labs and imaging studies  CBC: Recent Labs  Lab 10/31/20 0224 10/31/20 1018 11/02/20 0334 11/03/20 0416 11/04/20 0141 11/05/20 0236  WBC 14.6*  --  13.1* 16.3* 15.7* 15.9*  HGB 16.7 17.0 15.9 16.3 16.5 15.8  HCT 49.4 50.0  46.9 47.4 48.0 46.1  MCV 102.9*  --  102.0* 100.2* 99.4 100.2*  PLT 163  --  161 152 164 026   Basic Metabolic Panel: Recent Labs  Lab 10/31/20 0224 10/31/20 1018 11/01/20 0022 11/02/20 0334 11/03/20 0416 11/04/20 0141 11/05/20 0236  NA 136   < > 139 139 138 137 136  K 4.6   < > 4.3 4.6 4.5 4.7 5.2*  CL 103  --  105 102 106 101 101  CO2 23  --  27 28 26 30 31   GLUCOSE 148*  --  131* 148* 184* 183* 167*  BUN 17  --  25* 20 22 26* 29*  CREATININE 0.88  --  1.10 0.80 0.79 0.95 0.93  CALCIUM 8.7*  --  9.1 8.7* 8.6* 8.7* 8.5*  MG 2.0  --   --   --   --   --   --    < > = values in this interval not displayed.   GFR: Estimated Creatinine Clearance: 83.8 mL/min (by C-G formula based on SCr of 0.93 mg/dL). Liver Function Tests: No results for input(s): AST, ALT, ALKPHOS, BILITOT, PROT, ALBUMIN in the last 168 hours. No results for input(s): LIPASE, AMYLASE in the last 168 hours. No results for input(s): AMMONIA in the last 168 hours. Coagulation Profile: No results for input(s): INR, PROTIME in the last 168 hours. Cardiac Enzymes: No results for input(s): CKTOTAL, CKMB, CKMBINDEX, TROPONINI in the last 168 hours. BNP (last 3 results) No results for input(s): PROBNP in the last 8760 hours. HbA1C: No results for input(s): HGBA1C in the last 72 hours. CBG: No results for input(s): GLUCAP in the last 168 hours. Lipid Profile: No results for input(s): CHOL, HDL, LDLCALC, TRIG, CHOLHDL, LDLDIRECT in the last 72 hours. Thyroid Function Tests: No results for input(s): TSH, T4TOTAL, FREET4, T3FREE, THYROIDAB in the last 72 hours. Anemia Panel: No results for input(s): VITAMINB12, FOLATE, FERRITIN, TIBC, IRON, RETICCTPCT in the last 72 hours.  Sepsis Labs: No results for input(s): PROCALCITON, LATICACIDVEN in the last 168 hours.  Recent Results (from the past 240 hour(s))  SARS CORONAVIRUS 2 (TAT 6-24 HRS) Nasopharyngeal Nasopharyngeal Swab     Status: None   Collection Time:  10/31/20  2:44 AM   Specimen: Nasopharyngeal Swab  Result Value Ref Range Status   SARS Coronavirus 2 NEGATIVE NEGATIVE Final    Comment: (NOTE) SARS-CoV-2 target nucleic acids are NOT DETECTED.  The SARS-CoV-2 RNA is generally detectable in upper and lower respiratory specimens during the acute phase of infection. Negative results do not preclude SARS-CoV-2 infection, do not rule out co-infections with other pathogens, and should not be used as the sole basis for treatment or other patient management decisions. Negative results must be combined with clinical observations, patient history, and epidemiological information. The expected result is Negative.  Fact Sheet for Patients: SugarRoll.be  Fact Sheet for Healthcare Providers: https://www.woods-mathews.com/  This test is not yet approved or cleared by the [place] FDA and  has been authorized for detection and/or diagnosis of SARS-CoV-2 by FDA under an Emergency Use Authorization (EUA). This  EUA will remain  in effect (meaning this test can be used) for the duration of the COVID-19 declaration under Se ction 564(b)(1) of the Act, 21 U.S.C. section 360bbb-3(b)(1), unless the authorization is terminated or revoked sooner.  Performed at [facility], [place] [address], [place], [place] [address]   Resp Panel by RT-PCR (Flu A&B, Covid) Nasopharyngeal Swab     Status: None   Collection Time: 10/31/20 10:32 AM   Specimen: Nasopharyngeal Swab; Nasopharyngeal(NP) swabs in vial transport medium  Result Value Ref Range Status   SARS Coronavirus 2 by RT PCR NEGATIVE NEGATIVE Final    Comment: (NOTE) SARS-CoV-2 target nucleic acids are NOT DETECTED.  The SARS-CoV-2 RNA is generally detectable in upper respiratory specimens during the acute phase of infection. The lowest concentration of SARS-CoV-2 viral copies this assay can detect is 138 copies/mL. A negative result does not preclude  SARS-Cov-2 infection and should not be used as the sole basis for treatment or other patient management decisions. A negative result may occur with  improper specimen collection/handling, submission of specimen other than nasopharyngeal swab, presence of viral mutation(s) within the areas targeted by this assay, and inadequate number of viral copies(<138 copies/mL). A negative result must be combined with clinical observations, patient history, and epidemiological information. The expected result is Negative.  Fact Sheet for Patients:  EntrepreneurPulse.com.au  Fact Sheet for Healthcare Providers:  IncredibleEmployment.be  This test is no t yet approved or cleared by the [place] FDA and  has been authorized for detection and/or diagnosis of SARS-CoV-2 by FDA under an Emergency Use Authorization (EUA). This EUA will remain  in effect (meaning this test can be used) for the duration of the COVID-19 declaration under Section 564(b)(1) of the Act, 21 U.S.C.section 360bbb-3(b)(1), unless the authorization is terminated  or revoked sooner.       Influenza A by PCR NEGATIVE NEGATIVE Final   Influenza B by PCR NEGATIVE NEGATIVE Final    Comment: (NOTE) The Xpert Xpress SARS-CoV-2/FLU/RSV plus assay is intended as an aid in the diagnosis of influenza from Nasopharyngeal swab specimens and should not be used as a sole basis for treatment. Nasal washings and aspirates are unacceptable for Xpert Xpress SARS-CoV-2/FLU/RSV testing.  Fact Sheet for Patients: EntrepreneurPulse.com.au  Fact Sheet for Healthcare Providers: IncredibleEmployment.be  This test is not yet approved or cleared by the [place] FDA and has been authorized for detection and/or diagnosis of SARS-CoV-2 by FDA under an Emergency Use Authorization (EUA). This EUA will remain in effect (meaning this test can be used) for the duration of  the COVID-19 declaration under Section 564(b)(1) of the Act, 21 U.S.C. section 360bbb-3(b)(1), unless the authorization is terminated or revoked.  Performed at [facility], [place] [address], [place], [place] [address]          Radiology Studies: CT ABDOMEN PELVIS WO CONTRAST  Result Date: 11/04/2020 CLINICAL DATA:  Nausea and vomiting. Left lower quadrant tenderness. Abdominal distension. EXAM: CT ABDOMEN AND PELVIS WITHOUT CONTRAST TECHNIQUE: Multidetector CT imaging of the abdomen and pelvis was performed following the standard protocol without IV contrast. COMPARISON:  Radiograph earlier today. FINDINGS: Lower chest: Subpleural densities in the lung bases favoring atelectasis. There is mucous plugging in the distal right lower lobe bronchials. No pleural fluid. Coronary artery calcifications. Hepatobiliary: Borderline hepatic steatosis. No evidence of focal lesion on this unenhanced exam. Clips in the gallbladder fossa postcholecystectomy. No biliary dilatation. Pancreas: Mild fatty atrophy of the head. No ductal dilatation  or inflammation. Spleen: Normal in size without focal abnormality. Adrenals/Urinary Tract: Normal adrenal glands. Mild bilateral perinephric edema. 2.8 cm cyst arises from the posterolateral right kidney. No hydronephrosis or renal stone. No evidence of solid lesion. Partially distended urinary bladder without wall thickening. Stomach/Bowel: Stomach distended with enteric contrast. Proximal small bowel is dilated, contrast and fluid-filled. Transition point from dilated to nondilated in the central anterior abdomen, series 6, image 40. More distal small bowel is decompressed. The appendix is normal. Moderate colonic stool burden. Diverticulosis most prominently affecting the left colon, without diverticulitis. Vascular/Lymphatic: Advanced aortic and branch atherosclerosis. There is a stent in the right common iliac artery. No portal venous or mesenteric gas. No  abdominopelvic adenopathy. Reproductive: Prominent prostate gland spans 5 cm transverse. Other: No ascites or free air. No significant mesenteric edema. No focal fluid collection. No abdominal wall hernia. Musculoskeletal: Degenerative disc disease at L5-S1. There are no acute or suspicious osseous abnormalities. IMPRESSION: 1. Small bowel obstruction with transition point in the central anterior abdomen, likely secondary to adhesions. 2. Colonic diverticulosis without diverticulitis. 3. Advanced aortic and branch atherosclerosis. Aortic Atherosclerosis (ICD10-I70.0). Electronically Signed   By: Keith Rake M.D.   On: 11/04/2020 18:32   DG Abd 1 View  Result Date: 11/04/2020 CLINICAL DATA:  70 year old male with history of left lower quadrant abdominal pain. Evaluate for ileus versus constipation. EXAM: ABDOMEN - 1 VIEW COMPARISON:  No priors. FINDINGS: Multiple dilated loops of small bowel measuring up to 4.7 cm in diameter. Gaseous distension of the stomach. There is gas and stool present throughout the colon and distal rectum. No gross pneumoperitoneum noted on these supine images. Surgical clips project over the right upper quadrant of the abdomen, compatible with prior cholecystectomy. IMPRESSION: 1. Nonspecific bowel gas pattern, as above. Findings may suggest an ileus in the appropriate clinical setting, however, the possibility of an early or partial small bowel obstruction is not excluded. Electronically Signed   By: Vinnie Langton M.D.   On: 11/04/2020 06:50        Scheduled Meds:  apixaban  5 mg Oral BID   clopidogrel  75 mg Oral Daily   diltiazem  120 mg Oral Daily   diphenhydrAMINE  50 mg Oral Once   Or   diphenhydrAMINE  50 mg Intravenous Once   guaiFENesin  1,200 mg Oral BID   ipratropium-albuterol  3 mL Nebulization TID   isosorbide mononitrate  30 mg Oral Daily   loratadine  10 mg Oral Daily   methylPREDNISolone (SOLU-MEDROL) injection  40 mg Intravenous Q12H   nicotine   21 mg Transdermal Daily   pantoprazole (PROTONIX) IV  40 mg Intravenous Q12H   rosuvastatin  20 mg Oral Daily   sertraline  50 mg Oral Daily   sodium chloride flush  3 mL Intravenous Q12H   umeclidinium-vilanterol  1 puff Inhalation Daily   vitamin B-12  100 mcg Oral Daily   Continuous Infusions:  sodium chloride     sodium chloride 75 mL/hr at 11/05/20 0850   promethazine (PHENERGAN) injection (IM or IVPB) 12.5 mg (11/04/20 1814)     LOS: 5 days    Time spent: 74min  Domenic Polite, MD [facility]  11/05/2020, 11:33 AM

## 2020-11-05 NOTE — Consult Note (Signed)
Consult Note  GLYNDON TURSI Sr. [birth year]  [id number].    Requesting MD: Domenic Polite, MD Chief Complaint/Reason for Consult: small bowel obstruction  HPI:  70 yo male with medical history of tobacco abuse, COPD, seasonal allergies, PVD/CAD who presented to [facility] ED with 1-2 weeks progressive worsening shortness of breath and chest pain/tightness. Patient was admitted to the hospitalist service for COPD exacerbation. During admission he has had intermittent chest pain and underwent echo which noted EF of 40-45% with wall motion abnormalities. Cardiology consulted and they are planning ischemic evaluation. He has new onset afib starting on apixiban this admission. He is on plavix as well for CAD and PAD.  He developed nausea, emesis, abdominal pain overnight 7/6-7/7. CT obtained 7/7 showing: Small bowel obstruction with transition point in the central anterior abdomen, likely secondary to adhesions. Colonic diverticulosis without diverticulitis." We were asked to see for further evaluation of possible SBO.  Since two nights ago he has had no further nausea or emesis. He remains bloated and has mild periumbilical abdominal pain. Yesterday following a suppository he passed a large amount of flatus and very small amount of stool passage. Otherwise last BM was diarrhea 2 days ago - no melena or hematochezia. He has had episodes similar to this in the past but not as severe and have resolved on their own. Tolerated clear liquids this am and now NPO. He denies fever, chills. ROS otherwise as below. He has not ambulated today.  He is known to our service with history of laparoscopic cholecystectomy 2009 followed by umbilical hernia s/p repair with ventral x patch by Dr. Rise Patience 2012 and history of pilonidal cyst.  Substance use: 100 pack year history Allergies: contrast media, PCN Blood thinners: apixiban, plavix   ROS: Review of Systems  Constitutional:  Negative for chills and  fever.  Respiratory:  Positive for shortness of breath and wheezing. Negative for cough.   Cardiovascular:  Negative for chest pain, palpitations and leg swelling.  Gastrointestinal:  Positive for abdominal pain, diarrhea, nausea and vomiting. Negative for blood in stool, constipation and melena.  Genitourinary: Negative.    Family History  Problem Relation Age of Onset   Heart disease Father    Asthma Son    Lung cancer Paternal Grandfather    CAD Brother    CAD Brother     Past Medical History:  Diagnosis Date   COPD (chronic obstructive pulmonary disease) ([place])    Coronary artery disease    a. s/p RCA stenting back in 2005 with a known chronically occluded circumflex and normal LV function.   Emphysema lung ([facility])    GERD (gastroesophageal reflux disease)    Heart attack ([place]) <2005   History of tobacco abuse    Hyperlipidemia    Hypertension    Lung nodule    a. Suspicious for malignancy, undergoing workup.   Obesity    Peripheral arterial disease ([place])    a. history of stent to right common iliac artery 12/15/99 with a peak for balloon-expandable stent. b. s/p intervention on RCIA total occlusion PTA and stent, residual disease on the right for possible staged intervention, notable disease on the left but asymptomatic in 2015. c. 10/2016: s/p PTA and stenting of left common iliac    Past Surgical History:  Procedure Laterality Date   CARDIAC CATHETERIZATION  10/26/1999   Archie Endo 09/13/2010   Cardiolite study     59% ejection fraction and negative for ischemia   CORONARY ANGIOPLASTY  WITH STENT PLACEMENT  2005   Taxus stent placed to his RCA    HERNIA REPAIR     ILIAC ARTERY STENT Right 12/15/1999   a. history of stent to right common iliac artery 12/15/99 with a peak for balloon-expandable stent. b. s/p intervention on RCIA total occlusion PTA and stent, residual disease on the right for possible staged intervention, notable disease on the left but asymptomatic.   ILIAC  VEIN ANGIOPLASTY / STENTING Right 03/16/2014   rt common iliac    by dr berry   LAPAROSCOPIC CHOLECYSTECTOMY  1990s   LOWER EXTREMITY ANGIOGRAM N/A 03/16/2014   Procedure: LOWER EXTREMITY ANGIOGRAM;  Surgeon: [name], MD;  Location: [facility] CATH LAB;  Service: Cardiovascular;  Laterality: N/A;   LOWER EXTREMITY ANGIOGRAPHY N/A 10/30/2016   Procedure: Lower Extremity Angiography;  Surgeon: [name], MD;  Location: [place] CV LAB;  Service: Cardiovascular;  Laterality: N/A;   LOWER EXTREMITY ANGIOGRAPHY N/A 11/30/2016   Procedure: Lower Extremity Angiography;  Surgeon: [name], MD;  Location: [place] CV LAB;  Service: Cardiovascular;  Laterality: N/A;   PERIPHERAL VASCULAR INTERVENTION Left 10/30/2016   Procedure: Peripheral Vascular Intervention;  Surgeon: [name], MD;  Location: [place] CV LAB;  Service: Cardiovascular;  Laterality: Left;  Lt Com ILIAC   TONSILLECTOMY AND ADENOIDECTOMY  [id number]   UMBILICAL HERNIA REPAIR  2012    Social History:  reports that he has been smoking cigarettes. He has a 100.00 pack-year smoking history. He has never used smokeless tobacco. He reports current alcohol use. He reports that he does not use drugs.  Allergies:  Allergies  Allergen Reactions   Amlodipine Cough   Aspirin Other (See Comments)    Sneezes, watery nose, wheeze (no polyps)    Contrast Media [Iodinated Diagnostic Agents]     Need to be pre-medicated, sneezing, watery eyes   Erythromycin Hives    Childhood allergy All mycin drugs    Losartan Potassium Cough   Penicillins     Childhood allergy Has patient had a PCN reaction causing immediate rash, facial/tongue/throat swelling, SOB or lightheadedness with hypotension: Unknown Has patient had a PCN reaction causing severe rash involving mucus membranes or skin necrosis: Unknown Has patient had a PCN reaction that required hospitalization: No Has patient had a PCN reaction occurring within the last 10  years: No States he has had benadryl when taking this and had no problems recently     Simvastatin Other (See Comments)    Hip pain   Sulfa Antibiotics     unknown    Medications Prior to Admission  Medication Sig Dispense Refill   albuterol (VENTOLIN HFA) 108 (90 Base) MCG/ACT inhaler Inhale 2 puffs into the lungs every 6 (six) hours as needed for wheezing or shortness of breath. 18 g 12   clopidogrel (PLAVIX) 75 MG tablet TAKE 1 TABLET(75 MG) BY MOUTH DAILY (Patient taking differently: Take 75 mg by mouth daily.) 90 tablet 3   Coenzyme Q10 (COQ10) 100 MG CAPS Take 100 mg by mouth daily.     fexofenadine (ALLEGRA) 180 MG tablet Take 180 mg by mouth daily.      fluticasone (FLONASE) 50 MCG/ACT nasal spray Place 1 spray into both nostrils daily. 48 g 4   Fluticasone-Umeclidin-Vilant (TRELEGY ELLIPTA) 100-62.5-25 MCG/INH AEPB Inhale 1 puff into the lungs daily. 60 each 12   ipratropium-albuterol (DUONEB) 0.5-2.5 (3) MG/3ML SOLN Take 3 mLs by nebulization every 6 (six) hours as needed. (Patient taking  differently: Take 3 mLs by nebulization every 6 (six) hours as needed (shortness of breath).) 75 mL prn   phenylephrine (SUDAFED PE) 10 MG TABS tablet Take 10 mg by mouth every 6 (six) hours as needed (congestion).     rosuvastatin (CRESTOR) 20 MG tablet TAKE 1 TABLET(20 MG) BY MOUTH DAILY (Patient taking differently: Take 20 mg by mouth daily.) 90 tablet 0   spironolactone (ALDACTONE) 25 MG tablet TAKE 1 TABLET(25 MG) BY MOUTH DAILY (Patient taking differently: Take 25 mg by mouth daily.) 30 tablet 2   triamcinolone (NASACORT) 55 MCG/ACT AERO nasal inhaler Place 2 sprays into the nose daily as needed (allergies).      Blood pressure 115/73, pulse 63, temperature 98.4 F (36.9 C), temperature source Oral, resp. rate 16, height 5\' 10"  (1.778 m), weight 88 kg, SpO2 91 %. Physical Exam:  General: pleasant, WD, male who is laying in bed in NAD HEENT: head is normocephalic, atraumatic.  Sclera are  noninjected.  PERRL.  Ears and nose without any masses or lesions.  Mouth is pink and moist Heart: regular, rate, and rhythm.  Normal s1,s2. No obvious murmurs, gallops, or rubs noted.  Palpable radial and pedal pulses bilaterally Lungs: Respiratory effort nonlabored on 3 lpm [place]. Audible expiratory wheeze Abd: soft, +BS. Mild distension. Mild focal TTP around umbilicus. Well healed surgical scar at umbilicus MS: all 4 extremities are symmetrical with no cyanosis, clubbing, or edema. Skin: warm and dry with no masses, lesions, or rashes Neuro: Cranial nerves 2-12 grossly intact, sensation is normal throughout Psych: A&Ox3 with an appropriate affect.   Results for orders placed or performed during the hospital encounter of 10/31/20 (from the past 48 hour(s))  CBC     Status: Abnormal   Collection Time: 11/04/20  1:41 AM  Result Value Ref Range   WBC 15.7 (H) 4.0 - 10.5 K/uL   RBC 4.83 4.22 - 5.81 MIL/uL   Hemoglobin 16.5 13.0 - 17.0 g/dL   HCT 48.0 39.0 - 52.0 %   MCV 99.4 80.0 - 100.0 fL   MCH 34.2 (H) 26.0 - 34.0 pg   MCHC 34.4 30.0 - 36.0 g/dL   RDW 12.4 11.5 - 15.5 %   Platelets 164 150 - 400 K/uL   nRBC 0.0 0.0 - 0.2 %    Comment: Performed at [facility], [place] [address], [place], [place] [address]  Basic metabolic panel     Status: Abnormal   Collection Time: 11/04/20  1:41 AM  Result Value Ref Range   Sodium 137 135 - 145 mmol/L   Potassium 4.7 3.5 - 5.1 mmol/L   Chloride 101 98 - 111 mmol/L   CO2 30 22 - 32 mmol/L   Glucose, Bld 183 (H) 70 - 99 mg/dL    Comment: Glucose reference range applies only to samples taken after fasting for at least 8 hours.   BUN 26 (H) 8 - 23 mg/dL   Creatinine, Ser 0.95 0.61 - 1.24 mg/dL   Calcium 8.7 (L) 8.9 - 10.3 mg/dL   GFR, Estimated >60 >60 mL/min    Comment: (NOTE) Calculated using the CKD-EPI Creatinine Equation (2021)    Anion gap 6 5 - 15    Comment: Performed at [facility] [address] [place], [place]  [address]  CBC     Status: Abnormal   Collection Time: 11/05/20  2:36 AM  Result Value Ref Range   WBC 15.9 (H) 4.0 - 10.5 K/uL  RBC 4.60 4.22 - 5.81 MIL/uL   Hemoglobin 15.8 13.0 - 17.0 g/dL   HCT 46.1 39.0 - 52.0 %   MCV 100.2 (H) 80.0 - 100.0 fL   MCH 34.3 (H) 26.0 - 34.0 pg   MCHC 34.3 30.0 - 36.0 g/dL   RDW 12.3 11.5 - 15.5 %   Platelets 163 150 - 400 K/uL   nRBC 0.0 0.0 - 0.2 %    Comment: Performed at [facility] [address], [place], [place] [address]  Basic metabolic panel     Status: Abnormal   Collection Time: 11/05/20  2:36 AM  Result Value Ref Range   Sodium 136 135 - 145 mmol/L   Potassium 5.2 (H) 3.5 - 5.1 mmol/L   Chloride 101 98 - 111 mmol/L   CO2 31 22 - 32 mmol/L   Glucose, Bld 167 (H) 70 - 99 mg/dL    Comment: Glucose reference range applies only to samples taken after fasting for at least 8 hours.   BUN 29 (H) 8 - 23 mg/dL   Creatinine, Ser 0.93 0.61 - 1.24 mg/dL   Calcium 8.5 (L) 8.9 - 10.3 mg/dL   GFR, Estimated >60 >60 mL/min    Comment: (NOTE) Calculated using the CKD-EPI Creatinine Equation (2021)    Anion gap 4 (L) 5 - 15    Comment: Performed at [facility] [address] [place], [place] [address]   CT ABDOMEN PELVIS WO CONTRAST  Result Date: 11/04/2020 CLINICAL DATA:  Nausea and vomiting. Left lower quadrant tenderness. Abdominal distension. EXAM: CT ABDOMEN AND PELVIS WITHOUT CONTRAST TECHNIQUE: Multidetector CT imaging of the abdomen and pelvis was performed following the standard protocol without IV contrast. COMPARISON:  Radiograph earlier today. FINDINGS: Lower chest: Subpleural densities in the lung bases favoring atelectasis. There is mucous plugging in the distal right lower lobe bronchials. No pleural fluid. Coronary artery calcifications. Hepatobiliary: Borderline hepatic steatosis. No evidence of focal lesion on this unenhanced exam. Clips in the gallbladder fossa postcholecystectomy. No biliary dilatation. Pancreas: Mild  fatty atrophy of the head. No ductal dilatation or inflammation. Spleen: Normal in size without focal abnormality. Adrenals/Urinary Tract: Normal adrenal glands. Mild bilateral perinephric edema. 2.8 cm cyst arises from the posterolateral right kidney. No hydronephrosis or renal stone. No evidence of solid lesion. Partially distended urinary bladder without wall thickening. Stomach/Bowel: Stomach distended with enteric contrast. Proximal small bowel is dilated, contrast and fluid-filled. Transition point from dilated to nondilated in the central anterior abdomen, series 6, image 40. More distal small bowel is decompressed. The appendix is normal. Moderate colonic stool burden. Diverticulosis most prominently affecting the left colon, without diverticulitis. Vascular/Lymphatic: Advanced aortic and branch atherosclerosis. There is a stent in the right common iliac artery. No portal venous or mesenteric gas. No abdominopelvic adenopathy. Reproductive: Prominent prostate gland spans 5 cm transverse. Other: No ascites or free air. No significant mesenteric edema. No focal fluid collection. No abdominal wall hernia. Musculoskeletal: Degenerative disc disease at L5-S1. There are no acute or suspicious osseous abnormalities. IMPRESSION: 1. Small bowel obstruction with transition point in the central anterior abdomen, likely secondary to adhesions. 2. Colonic diverticulosis without diverticulitis. 3. Advanced aortic and branch atherosclerosis. Aortic Atherosclerosis (ICD10-I70.0). Electronically Signed   By: Keith Rake M.D.   On: 11/04/2020 18:32   DG Abd 1 View  Result Date: 11/04/2020 CLINICAL DATA:  70 year old male with history of left lower quadrant abdominal pain. Evaluate for ileus versus constipation. EXAM: ABDOMEN - 1 VIEW  COMPARISON:  No priors. FINDINGS: Multiple dilated loops of small bowel measuring up to 4.7 cm in diameter. Gaseous distension of the stomach. There is gas and stool present throughout  the colon and distal rectum. No gross pneumoperitoneum noted on these supine images. Surgical clips project over the right upper quadrant of the abdomen, compatible with prior cholecystectomy. IMPRESSION: 1. Nonspecific bowel gas pattern, as above. Findings may suggest an ileus in the appropriate clinical setting, however, the possibility of an early or partial small bowel obstruction is not excluded. Electronically Signed   By: Vinnie Langton M.D.   On: 11/04/2020 06:50      Assessment/Plan SBO vs ileus? - history of laparoscopic cholecystectomy and VHR with mesh >10 years ago - CT with small bowel obstruction with transition point in the central anterior abdomen, likely secondary to adhesions. - initial symptoms of nausea, emesis, abdominal pain have all improved and tolerated clears this am but he does have some ongoing distension and abdominal pain. Has not had BM or passed flatus today. Continue NPO but hold off on NGT placement given his improvement.  - Will get a repeat abd xray today - recommend NGT for worsening distension, abdominal pain, nausea/emesis - ambulate today - we will follow  FEN: NPO, IVF per primary ID: none VTE: eliquis, plavix  Per primary: Acute COPD exacerbation. Acute hypoxic respiratory failure History of CAD, chest tightness, PVCs Demand ischemia Paroxysmal atrial fibrillation Depression Tobacco abuse Macrocytosis  Winferd Humphrey, [facility] 11/05/2020, 10:05 AM Please see Amion for pager number during day hours 7:00am-4:30pm

## 2020-11-06 ENCOUNTER — Inpatient Hospital Stay (HOSPITAL_COMMUNITY): Payer: Medicare Other

## 2020-11-06 DIAGNOSIS — J441 Chronic obstructive pulmonary disease with (acute) exacerbation: Secondary | ICD-10-CM | POA: Diagnosis not present

## 2020-11-06 LAB — BASIC METABOLIC PANEL
Anion gap: 5 (ref 5–15)
BUN: 30 mg/dL — ABNORMAL HIGH (ref 8–23)
CO2: 30 mmol/L (ref 22–32)
Calcium: 8.1 mg/dL — ABNORMAL LOW (ref 8.9–10.3)
Chloride: 100 mmol/L (ref 98–111)
Creatinine, Ser: 0.92 mg/dL (ref 0.61–1.24)
GFR, Estimated: >60 mL/min (ref >60)
Glucose, Bld: 165 mg/dL — ABNORMAL HIGH (ref 70–99)
Potassium: 5.6 mmol/L — ABNORMAL HIGH (ref 3.5–5.1)
Sodium: 135 mmol/L (ref 135–145)

## 2020-11-06 LAB — CBC
HCT: 45.2 % (ref 39.0–52.0)
Hemoglobin: 15.9 g/dL (ref 13.0–17.0)
MCH: 34.7 pg — ABNORMAL HIGH (ref 26.0–34.0)
MCHC: 35.2 g/dL (ref 30.0–36.0)
MCV: 98.7 fL (ref 80.0–100.0)
Platelets: 141 10*3/uL — ABNORMAL LOW (ref 150–400)
RBC: 4.58 MIL/uL (ref 4.22–5.81)
RDW: 12.1 % (ref 11.5–15.5)
WBC: 13 10*3/uL — ABNORMAL HIGH (ref 4.0–10.5)
nRBC: 0 % (ref 0.0–0.2)

## 2020-11-06 MED ORDER — SODIUM ZIRCONIUM CYCLOSILICATE 10 G PO PACK
10.0000 g | PACK | Freq: Once | ORAL | Status: AC
  Administered 2020-11-06: 10 g via ORAL
  Filled 2020-11-06: qty 1

## 2020-11-06 MED ORDER — PREDNISONE 20 MG PO TABS
40.0000 mg | ORAL_TABLET | Freq: Every day | ORAL | Status: DC
  Administered 2020-11-06 – 2020-11-07 (×2): 40 mg via ORAL
  Filled 2020-11-06 (×2): qty 2

## 2020-11-06 MED ORDER — SODIUM CHLORIDE 0.9 % IV SOLN: INTRAVENOUS | Status: DC

## 2020-11-06 NOTE — Progress Notes (Signed)
PROGRESS NOTE    Brady SIEFRING Sr.  LEX:[id number] DOB: May 17, 1950 DOA: 10/31/2020 PCP: Billie Ruddy, MD   Brief Narrative: 70 year old with past medical history significant for tobacco abuse, COPD, seasonal allergies, PVD/CAD who presents with 1 or 2 weeks progressive worsening shortness of breath and chest pain/tightness. Patient was admitted for COPD exacerbation. -Had intermittent chest pain this admission, 2D echo noted EF of 40-45% with wall motion abnormalities, cardiology consulted, recommended outpatient ischemic evaluation -Early a.m. 7/7 with abdominal distention, bloating, vomiting   Assessment & Plan:   Acute COPD exacerbation.  Acute hypoxic respiratory failure -Clinically improving, no further wheezing -Will transition from IV steroids to prednisone taper today -Continue duo nebs, will need ambulatory O2 sats checked at discharge  History of CAD, chest tightness, PVCs Demand ischemia -Troponin elevation, 2D echo with EF of 40-45% with regional wall motion abnormalities -Cardiology consulted, recommended ischemic evaluation likely cath in the near future after he has recovered from acute COPD exacerbation -Continue Plavix and Crestor  PSBO -CT yesterday 7/7 with concerns for SBO however following his CT he had a small BM and passed large amount of flatus after which she started feeling better symptomatically without further vomiting, NG tube decompression was deferred -General surgery following, continues to have mild abdominal symptoms without further vomiting, passing flatus -Increase activity, ambulate, will hold Eliquis in the event of potential decompensation  Paroxysmal atrial fibrillation -Intermittent episodes of A. fib, PVCs -Started on low-dose diltiazem for rate control by cards, cannot tolerate beta-blocker due to active COPD exacerbation -Started on Eliquis by cardiology this admission, will hold further doses pending improvement in P  SBO  Depression;  -Appreciate psych input, continue Zoloft and nicotine patch  Tobacco abuse -100-pack-year smoking history -Counsel, nicotine patch  Macrocytosis:  -B 12  320. Started B 12 supplement.    Estimated body mass index is 27.84 kg/m as calculated from the following:   Height as of this encounter: 5\' 10"  (1.778 m).   Weight as of this encounter: 88 kg.   DVT prophylaxis: On Eliquis, hold this Code Status: Full Code Family Communication: No family at bedside, updated wife on telephone Disposition Plan:  Status is: Inpatient  Remains inpatient appropriate because:IV treatments appropriate due to intensity of illness or inability to take PO  Dispo: The patient is from: Home              Anticipated d/c is to: Home              Patient currently is not medically stable to d/c.   Difficult to place patient No   Consultants:  Cardiology  General surgery  Procedures:  None  Antimicrobials:    Subjective: -Passing small amounts of flatus, denies any nausea or vomiting, mild lower abdominal discomfort -Breathing overall better   Objective: Vitals:   11/05/20 2043 11/06/20 0044 11/06/20 0515 11/06/20 0900  BP:  127/65 133/74   Pulse:  62 63 66  Resp:  18 18 16   Temp:  98.3 F (36.8 C) 98.7 F (37.1 C)   TempSrc:  Oral Oral   SpO2: 95% 96% 96% 96%  Weight:      Height:        Intake/Output Summary (Last 24 hours) at 11/06/2020 1142 Last data filed at 11/06/2020 0800 Gross per 24 hour  Intake 50 ml  Output --  Net 50 ml    Filed Weights   10/31/20 0229  Weight: 88 kg    Examination: Gen: AAOx3,  no distress HEENT: No JVD CVS: S1-S2, regular rate rhythm Lungs: Improving air movement, no expiratory wheezes today Abdomen: Soft, mildly distended, nontender, bowel sounds present Extremities: No edema Skin: no new rashes on exposed skin     Data Reviewed: I have personally reviewed following labs and imaging studies  CBC: Recent Labs  Lab  11/02/20 0334 11/03/20 0416 11/04/20 0141 11/05/20 0236 11/06/20 0229  WBC 13.1* 16.3* 15.7* 15.9* 13.0*  HGB 15.9 16.3 16.5 15.8 15.9  HCT 46.9 47.4 48.0 46.1 45.2  MCV 102.0* 100.2* 99.4 100.2* 98.7  PLT 161 152 164 163 742*   Basic Metabolic Panel: Recent Labs  Lab 10/31/20 0224 10/31/20 1018 11/02/20 0334 11/03/20 0416 11/04/20 0141 11/05/20 0236 11/06/20 0229  NA 136   < > 139 138 137 136 135  K 4.6   < > 4.6 4.5 4.7 5.2* 5.6*  CL 103   < > 102 106 101 101 100  CO2 23   < > 28 26 30 31 30   GLUCOSE 148*   < > 148* 184* 183* 167* 165*  BUN 17   < > 20 22 26* 29* 30*  CREATININE 0.88   < > 0.80 0.79 0.95 0.93 0.92  CALCIUM 8.7*   < > 8.7* 8.6* 8.7* 8.5* 8.1*  MG 2.0  --   --   --   --   --   --    < > = values in this interval not displayed.   GFR: Estimated Creatinine Clearance: 84.7 mL/min (by C-G formula based on SCr of 0.92 mg/dL). Liver Function Tests: No results for input(s): AST, ALT, ALKPHOS, BILITOT, PROT, ALBUMIN in the last 168 hours. No results for input(s): LIPASE, AMYLASE in the last 168 hours. No results for input(s): AMMONIA in the last 168 hours. Coagulation Profile: No results for input(s): INR, PROTIME in the last 168 hours. Cardiac Enzymes: No results for input(s): CKTOTAL, CKMB, CKMBINDEX, TROPONINI in the last 168 hours. BNP (last 3 results) No results for input(s): PROBNP in the last 8760 hours. HbA1C: No results for input(s): HGBA1C in the last 72 hours. CBG: No results for input(s): GLUCAP in the last 168 hours. Lipid Profile: No results for input(s): CHOL, HDL, LDLCALC, TRIG, CHOLHDL, LDLDIRECT in the last 72 hours. Thyroid Function Tests: No results for input(s): TSH, T4TOTAL, FREET4, T3FREE, THYROIDAB in the last 72 hours. Anemia Panel: No results for input(s): VITAMINB12, FOLATE, FERRITIN, TIBC, IRON, RETICCTPCT in the last 72 hours.  Sepsis Labs: No results for input(s): PROCALCITON, LATICACIDVEN in the last 168 hours.  Recent  Results (from the past 240 hour(s))  SARS CORONAVIRUS 2 (TAT 6-24 HRS) Nasopharyngeal Nasopharyngeal Swab     Status: None   Collection Time: 10/31/20  2:44 AM   Specimen: Nasopharyngeal Swab  Result Value Ref Range Status   SARS Coronavirus 2 NEGATIVE NEGATIVE Final    Comment: (NOTE) SARS-CoV-2 target nucleic acids are NOT DETECTED.  The SARS-CoV-2 RNA is generally detectable in upper and lower respiratory specimens during the acute phase of infection. Negative results do not preclude SARS-CoV-2 infection, do not rule out co-infections with other pathogens, and should not be used as the sole basis for treatment or other patient management decisions. Negative results must be combined with clinical observations, patient history, and epidemiological information. The expected result is Negative.  Fact Sheet for Patients: SugarRoll.be  Fact Sheet for Healthcare Providers: https://www.woods-mathews.com/  This test is not yet approved or cleared by the [place] FDA and  has been authorized for detection and/or diagnosis of SARS-CoV-2 by FDA under an Emergency Use Authorization (EUA). This EUA will remain  in effect (meaning this test can be used) for the duration of the COVID-19 declaration under Se ction 564(b)(1) of the Act, 21 U.S.C. section 360bbb-3(b)(1), unless the authorization is terminated or revoked sooner.  Performed at [facility], [place] [address], [place], [place] [address]   Resp Panel by RT-PCR (Flu A&B, Covid) Nasopharyngeal Swab     Status: None   Collection Time: 10/31/20 10:32 AM   Specimen: Nasopharyngeal Swab; Nasopharyngeal(NP) swabs in vial transport medium  Result Value Ref Range Status   SARS Coronavirus 2 by RT PCR NEGATIVE NEGATIVE Final    Comment: (NOTE) SARS-CoV-2 target nucleic acids are NOT DETECTED.  The SARS-CoV-2 RNA is generally detectable in upper respiratory specimens during the acute phase  of infection. The lowest concentration of SARS-CoV-2 viral copies this assay can detect is 138 copies/mL. A negative result does not preclude SARS-Cov-2 infection and should not be used as the sole basis for treatment or other patient management decisions. A negative result may occur with  improper specimen collection/handling, submission of specimen other than nasopharyngeal swab, presence of viral mutation(s) within the areas targeted by this assay, and inadequate number of viral copies(<138 copies/mL). A negative result must be combined with clinical observations, patient history, and epidemiological information. The expected result is Negative.  Fact Sheet for Patients:  EntrepreneurPulse.com.au  Fact Sheet for Healthcare Providers:  IncredibleEmployment.be  This test is no t yet approved or cleared by the [place] FDA and  has been authorized for detection and/or diagnosis of SARS-CoV-2 by FDA under an Emergency Use Authorization (EUA). This EUA will remain  in effect (meaning this test can be used) for the duration of the COVID-19 declaration under Section 564(b)(1) of the Act, 21 U.S.C.section 360bbb-3(b)(1), unless the authorization is terminated  or revoked sooner.       Influenza A by PCR NEGATIVE NEGATIVE Final   Influenza B by PCR NEGATIVE NEGATIVE Final    Comment: (NOTE) The Xpert Xpress SARS-CoV-2/FLU/RSV plus assay is intended as an aid in the diagnosis of influenza from Nasopharyngeal swab specimens and should not be used as a sole basis for treatment. Nasal washings and aspirates are unacceptable for Xpert Xpress SARS-CoV-2/FLU/RSV testing.  Fact Sheet for Patients: EntrepreneurPulse.com.au  Fact Sheet for Healthcare Providers: IncredibleEmployment.be  This test is not yet approved or cleared by the [place] FDA and has been authorized for detection and/or diagnosis of  SARS-CoV-2 by FDA under an Emergency Use Authorization (EUA). This EUA will remain in effect (meaning this test can be used) for the duration of the COVID-19 declaration under Section 564(b)(1) of the Act, 21 U.S.C. section 360bbb-3(b)(1), unless the authorization is terminated or revoked.  Performed at [facility], [place] [address], [place], [place] [address]          Radiology Studies: CT ABDOMEN PELVIS WO CONTRAST  Result Date: 11/04/2020 CLINICAL DATA:  Nausea and vomiting. Left lower quadrant tenderness. Abdominal distension. EXAM: CT ABDOMEN AND PELVIS WITHOUT CONTRAST TECHNIQUE: Multidetector CT imaging of the abdomen and pelvis was performed following the standard protocol without IV contrast. COMPARISON:  Radiograph earlier today. FINDINGS: Lower chest: Subpleural densities in the lung bases favoring atelectasis. There is mucous plugging in the distal right lower lobe bronchials. No pleural fluid. Coronary artery calcifications. Hepatobiliary: Borderline hepatic steatosis. No evidence of focal lesion on this unenhanced exam. Clips  in the gallbladder fossa postcholecystectomy. No biliary dilatation. Pancreas: Mild fatty atrophy of the head. No ductal dilatation or inflammation. Spleen: Normal in size without focal abnormality. Adrenals/Urinary Tract: Normal adrenal glands. Mild bilateral perinephric edema. 2.8 cm cyst arises from the posterolateral right kidney. No hydronephrosis or renal stone. No evidence of solid lesion. Partially distended urinary bladder without wall thickening. Stomach/Bowel: Stomach distended with enteric contrast. Proximal small bowel is dilated, contrast and fluid-filled. Transition point from dilated to nondilated in the central anterior abdomen, series 6, image 40. More distal small bowel is decompressed. The appendix is normal. Moderate colonic stool burden. Diverticulosis most prominently affecting the left colon, without diverticulitis.  Vascular/Lymphatic: Advanced aortic and branch atherosclerosis. There is a stent in the right common iliac artery. No portal venous or mesenteric gas. No abdominopelvic adenopathy. Reproductive: Prominent prostate gland spans 5 cm transverse. Other: No ascites or free air. No significant mesenteric edema. No focal fluid collection. No abdominal wall hernia. Musculoskeletal: Degenerative disc disease at L5-S1. There are no acute or suspicious osseous abnormalities. IMPRESSION: 1. Small bowel obstruction with transition point in the central anterior abdomen, likely secondary to adhesions. 2. Colonic diverticulosis without diverticulitis. 3. Advanced aortic and branch atherosclerosis. Aortic Atherosclerosis (ICD10-I70.0). Electronically Signed   By: Keith Rake M.D.   On: 11/04/2020 18:32   DG Abd 1 View  Result Date: 11/05/2020 CLINICAL DATA:  Small bowel obstruction, nausea and vomiting EXAM: ABDOMEN - 1 VIEW COMPARISON:  11/04/2020 FINDINGS: Interval increase in diffuse gas distention of the small bowel in the mid abdomen, loops measuring up to 6.7 cm. There is gas and stool present throughout the colon to the rectum. No free air in the abdomen on supine radiographs. IMPRESSION: 1. Interval increase in diffuse gas distention of the small bowel in the mid abdomen, loops measuring up to 6.7 cm. There is gas and stool present throughout the colon to the rectum. 2.  No free air in the abdomen on supine radiographs. Electronically Signed   By: Eddie Candle M.D.   On: 11/05/2020 14:02   DG Abd Portable 1V  Result Date: 11/06/2020 CLINICAL DATA:  RIGHT lower quadrant pain with nausea. EXAM: PORTABLE ABDOMEN - 1 VIEW COMPARISON:  Plain film of the abdomen dated 11/05/2020. FINDINGS: Persistently distended gas-filled loops of small bowel throughout the abdomen and upper pelvis. Air is seen within the relatively decompressed colon. Cholecystectomy clips in the RIGHT upper quadrant. No evidence of free  intraperitoneal air. IMPRESSION: Continued evidence of small-bowel obstruction, not significantly changed compared to most recent chest x-ray of 11/05/2020 but increased small bowel distension compared to earlier plain film of 11/04/2020. Associated transition zone demonstrated in the central anterior abdomen on CT abdomen of 11/04/2020. Electronically Signed   By: Franki Cabot M.D.   On: 11/06/2020 10:46        Scheduled Meds:  clopidogrel  75 mg Oral Daily   diltiazem  120 mg Oral Daily   diphenhydrAMINE  50 mg Oral Once   Or   diphenhydrAMINE  50 mg Intravenous Once   guaiFENesin  1,200 mg Oral BID   ipratropium-albuterol  3 mL Nebulization TID   isosorbide mononitrate  30 mg Oral Daily   loratadine  10 mg Oral Daily   nicotine  21 mg Transdermal Daily   pantoprazole (PROTONIX) IV  40 mg Intravenous Q12H   predniSONE  40 mg Oral Q breakfast   rosuvastatin  20 mg Oral Daily   sertraline  50 mg Oral Daily  sodium chloride flush  3 mL Intravenous Q12H   umeclidinium-vilanterol  1 puff Inhalation Daily   vitamin B-12  100 mcg Oral Daily   Continuous Infusions:  sodium chloride     sodium chloride 75 mL/hr at 11/06/20 0944   promethazine (PHENERGAN) injection (IM or IVPB) 12.5 mg (11/04/20 1814)     LOS: 6 days   Time spent: 18min  Domenic Polite, MD [facility]  11/06/2020, 11:42 AM

## 2020-11-06 NOTE — Plan of Care (Signed)
  Problem: Education: Goal: Knowledge of General Education information will improve Description Including pain rating scale, medication(s)/side effects and non-pharmacologic comfort measures Outcome: Progressing   

## 2020-11-06 NOTE — Progress Notes (Signed)
Subjective/Chief Complaint: Passing flatus, no BM Less distended Plain films - contrast is completely in the colon   Objective: Vital signs in last 24 hours: Temp:  [98.3 F (36.8 C)-99.4 F (37.4 C)] 98.7 F (37.1 C) (07/09 0515) Pulse Rate:  [56-66] 66 (07/09 0900) Resp:  [16-18] 16 (07/09 0900) BP: (123-133)/(65-74) 133/74 (07/09 0515) SpO2:  [91 %-96 %] 96 % (07/09 0900) Last BM Date: 11/02/20  Intake/Output from previous day: 07/08 0701 - 07/09 0700 In: 50 [IV Piggyback:50] Out: -  Intake/Output this shift: No intake/output data recorded.  WDWN in NAD Abd -soft, healed incision; non-tender - mildly distended  Lab Results:  Recent Labs    11/05/20 0236 11/06/20 0229  WBC 15.9* 13.0*  HGB 15.8 15.9  HCT 46.1 45.2  PLT 163 141*   BMET Recent Labs    11/05/20 0236 11/06/20 0229  NA 136 135  K 5.2* 5.6*  CL 101 100  CO2 31 30  GLUCOSE 167* 165*  BUN 29* 30*  CREATININE 0.93 0.92  CALCIUM 8.5* 8.1*   PT/INR No results for input(s): LABPROT, INR in the last 72 hours. ABG No results for input(s): PHART, HCO3 in the last 72 hours.  Invalid input(s): PCO2, PO2  Studies/Results: CT ABDOMEN PELVIS WO CONTRAST  Result Date: 11/04/2020 CLINICAL DATA:  Nausea and vomiting. Left lower quadrant tenderness. Abdominal distension. EXAM: CT ABDOMEN AND PELVIS WITHOUT CONTRAST TECHNIQUE: Multidetector CT imaging of the abdomen and pelvis was performed following the standard protocol without IV contrast. COMPARISON:  Radiograph earlier today. FINDINGS: Lower chest: Subpleural densities in the lung bases favoring atelectasis. There is mucous plugging in the distal right lower lobe bronchials. No pleural fluid. Coronary artery calcifications. Hepatobiliary: Borderline hepatic steatosis. No evidence of focal lesion on this unenhanced exam. Clips in the gallbladder fossa postcholecystectomy. No biliary dilatation. Pancreas: Mild fatty atrophy of the head. No ductal  dilatation or inflammation. Spleen: Normal in size without focal abnormality. Adrenals/Urinary Tract: Normal adrenal glands. Mild bilateral perinephric edema. 2.8 cm cyst arises from the posterolateral right kidney. No hydronephrosis or renal stone. No evidence of solid lesion. Partially distended urinary bladder without wall thickening. Stomach/Bowel: Stomach distended with enteric contrast. Proximal small bowel is dilated, contrast and fluid-filled. Transition point from dilated to nondilated in the central anterior abdomen, series 6, image 40. More distal small bowel is decompressed. The appendix is normal. Moderate colonic stool burden. Diverticulosis most prominently affecting the left colon, without diverticulitis. Vascular/Lymphatic: Advanced aortic and branch atherosclerosis. There is a stent in the right common iliac artery. No portal venous or mesenteric gas. No abdominopelvic adenopathy. Reproductive: Prominent prostate gland spans 5 cm transverse. Other: No ascites or free air. No significant mesenteric edema. No focal fluid collection. No abdominal wall hernia. Musculoskeletal: Degenerative disc disease at L5-S1. There are no acute or suspicious osseous abnormalities. IMPRESSION: 1. Small bowel obstruction with transition point in the central anterior abdomen, likely secondary to adhesions. 2. Colonic diverticulosis without diverticulitis. 3. Advanced aortic and branch atherosclerosis. Aortic Atherosclerosis (ICD10-I70.0). Electronically Signed   By: Keith Rake M.D.   On: 11/04/2020 18:32   DG Abd 1 View  Result Date: 11/05/2020 CLINICAL DATA:  Small bowel obstruction, nausea and vomiting EXAM: ABDOMEN - 1 VIEW COMPARISON:  11/04/2020 FINDINGS: Interval increase in diffuse gas distention of the small bowel in the mid abdomen, loops measuring up to 6.7 cm. There is gas and stool present throughout the colon to the rectum. No free air in the abdomen on  supine radiographs. IMPRESSION: 1. Interval  increase in diffuse gas distention of the small bowel in the mid abdomen, loops measuring up to 6.7 cm. There is gas and stool present throughout the colon to the rectum. 2.  No free air in the abdomen on supine radiographs. Electronically Signed   By: Eddie Candle M.D.   On: 11/05/2020 14:02   DG Abd Portable 1V  Result Date: 11/06/2020 CLINICAL DATA:  RIGHT lower quadrant pain with nausea. EXAM: PORTABLE ABDOMEN - 1 VIEW COMPARISON:  Plain film of the abdomen dated 11/05/2020. FINDINGS: Persistently distended gas-filled loops of small bowel throughout the abdomen and upper pelvis. Air is seen within the relatively decompressed colon. Cholecystectomy clips in the RIGHT upper quadrant. No evidence of free intraperitoneal air. IMPRESSION: Continued evidence of small-bowel obstruction, not significantly changed compared to most recent chest x-ray of 11/05/2020 but increased small bowel distension compared to earlier plain film of 11/04/2020. Associated transition zone demonstrated in the central anterior abdomen on CT abdomen of 11/04/2020. Electronically Signed   By: Franki Cabot M.D.   On: 11/06/2020 10:46    Anti-infectives: Anti-infectives (From admission, onward)    Start     Dose/Rate Route Frequency Ordered Stop   11/02/20 1115  doxycycline (VIBRA-TABS) tablet 100 mg  Status:  Discontinued        100 mg Oral Every 12 hours 11/02/20 1027 11/04/20 0930       Assessment/Plan: PSBO - resolved - history of laparoscopic cholecystectomy and VHR with mesh >10 years ago - CT with small bowel obstruction with transition point in the central anterior abdomen, likely secondary to adhesions. - initial symptoms of nausea, emesis, abdominal pain have all improved; resume clear liquids - If his symptoms worsen, we will consider surgery - ambulate today - we will follow   FEN: NPO, IVF per primary ID: none VTE: eliquis, plavix   Per primary: Acute COPD exacerbation. Acute hypoxic respiratory  failure History of CAD, chest tightness, PVCs Demand ischemia Paroxysmal atrial fibrillation Depression Tobacco abuse Macrocytosis  Discussed with his wife, who seems adamant that he have surgery this admission.  He has had intermittent abdominal symptoms over the last year.  I explained to her that he was a high risk surgical candidate because of his other comorbidities and that he currently had no indications for surgery.  We will see how he does with a diet.  She seemed dissatisfied with the discussion.   LOS: 6 days    Brady Brooks 11/06/2020

## 2020-11-07 DIAGNOSIS — Z720 Tobacco use: Secondary | ICD-10-CM | POA: Diagnosis not present

## 2020-11-07 DIAGNOSIS — I739 Peripheral vascular disease, unspecified: Secondary | ICD-10-CM | POA: Diagnosis not present

## 2020-11-07 DIAGNOSIS — I2510 Atherosclerotic heart disease of native coronary artery without angina pectoris: Secondary | ICD-10-CM | POA: Diagnosis not present

## 2020-11-07 DIAGNOSIS — I10 Essential (primary) hypertension: Secondary | ICD-10-CM | POA: Diagnosis not present

## 2020-11-07 LAB — BASIC METABOLIC PANEL
Anion gap: 3 — ABNORMAL LOW (ref 5–15)
BUN: 26 mg/dL — ABNORMAL HIGH (ref 8–23)
CO2: 30 mmol/L (ref 22–32)
Calcium: 7.8 mg/dL — ABNORMAL LOW (ref 8.9–10.3)
Chloride: 103 mmol/L (ref 98–111)
Creatinine, Ser: 0.77 mg/dL (ref 0.61–1.24)
GFR, Estimated: >60 mL/min (ref >60)
Glucose, Bld: 132 mg/dL — ABNORMAL HIGH (ref 70–99)
Potassium: 5.1 mmol/L (ref 3.5–5.1)
Sodium: 136 mmol/L (ref 135–145)

## 2020-11-07 LAB — URINALYSIS, ROUTINE W REFLEX MICROSCOPIC
Bacteria, UA: NONE SEEN
Bilirubin Urine: NEGATIVE
Glucose, UA: NEGATIVE mg/dL
Ketones, ur: NEGATIVE mg/dL
Leukocytes,Ua: NEGATIVE
Nitrite: NEGATIVE
Protein, ur: NEGATIVE mg/dL
RBC / HPF: >50 RBC/hpf — ABNORMAL HIGH (ref 0–5)
Specific Gravity, Urine: 1.017 (ref 1.005–1.030)
pH: 5 (ref 5.0–8.0)

## 2020-11-07 LAB — CBC
HCT: 44.7 % (ref 39.0–52.0)
Hemoglobin: 15.4 g/dL (ref 13.0–17.0)
MCH: 34 pg (ref 26.0–34.0)
MCHC: 34.5 g/dL (ref 30.0–36.0)
MCV: 98.7 fL (ref 80.0–100.0)
Platelets: 134 10*3/uL — ABNORMAL LOW (ref 150–400)
RBC: 4.53 MIL/uL (ref 4.22–5.81)
RDW: 12 % (ref 11.5–15.5)
WBC: 15.3 10*3/uL — ABNORMAL HIGH (ref 4.0–10.5)
nRBC: 0 % (ref 0.0–0.2)

## 2020-11-07 MED ORDER — PREDNISONE 20 MG PO TABS
30.0000 mg | ORAL_TABLET | Freq: Every day | ORAL | Status: DC
  Administered 2020-11-08: 30 mg via ORAL
  Filled 2020-11-07: qty 1

## 2020-11-07 MED ORDER — BISACODYL 10 MG RE SUPP
10.0000 mg | Freq: Once | RECTAL | Status: AC
  Administered 2020-11-07: 10 mg via RECTAL
  Filled 2020-11-07: qty 1

## 2020-11-07 NOTE — Progress Notes (Addendum)
Patient c/o seeing slight blood in urine last void during previous shift. MD order for urine sample. Patient aware that he needed to provide sample. Clean urinal given. Will obtain and send to lab.  1118: Urine sample obtained and sent to lab. Urine clear/yellow, no blood noted. Pt also has a small lumpy brow/clay BM.

## 2020-11-07 NOTE — Plan of Care (Signed)
  Problem: Education: Goal: Knowledge of General Education information will improve Description Including pain rating scale, medication(s)/side effects and non-pharmacologic comfort measures Outcome: Progressing   Problem: Health Behavior/Discharge Planning: Goal: Ability to manage health-related needs will improve Outcome: Progressing   

## 2020-11-07 NOTE — Progress Notes (Addendum)
PROGRESS NOTE    Brady GARDEN Sr.  GXQ:[id number] DOB: 1950-05-23 DOA: 10/31/2020 PCP: Billie Ruddy, MD   Brief Narrative: 70 year old with past medical history significant for tobacco abuse, COPD, seasonal allergies, PVD/CAD who presents with 1 or 2 weeks progressive worsening shortness of breath and chest pain/tightness. Patient was admitted for COPD exacerbation. -Had intermittent chest pain this admission, 2D echo noted EF of 40-45% with wall motion abnormalities, cardiology consulted, recommended outpatient ischemic evaluation -Early a.m. 7/7 with abdominal distention, bloating, vomiting   Assessment & Plan:   Acute COPD exacerbation.  Acute hypoxic respiratory failure -Improved, no further wheezing, transition to prednisone taper -Continue duo nebs, will need ambulatory O2 sats checked at discharge  History of CAD, chest tightness, PVCs Demand ischemia -Troponin elevation, 2D echo with EF of 40-45% with regional wall motion abnormalities -Cardiology consulted, recommended ischemic evaluation likely cath in the near future after he has recovered from acute COPD exacerbation -Continue Crestor, hold Plavix in the setting of SBO  PSBO -CT 7/7 evening was concerning for SBO however following his CT he had a small BM and passed large amount of flatus after which she started feeling better symptomatically without further vomiting, NG tube decompression was deferred -General surgery following, continues to have mild abdominal symptoms without further vomiting, passing flatus, started clears yesterday -Increase activity, ambulate, holding Eliquis in the event of potential decompensation needing OR  Paroxysmal atrial fibrillation -Intermittent episodes of A. fib, PVCs -Started on low-dose diltiazem for rate control by cards, cannot tolerate beta-blocker due to active COPD exacerbation -Started on Eliquis by cardiology this admission, will hold further doses pending improvement  in P SBO, in NSR now  Depression;  -Appreciate psych input, continue Zoloft and nicotine patch  Tobacco abuse -100-pack-year smoking history -Counsel, nicotine patch  Macrocytosis:  -B 12  320. Started B 12 supplement.    Estimated body mass index is 27.84 kg/m as calculated from the following:   Height as of this encounter: 5\' 10"  (1.778 m).   Weight as of this encounter: 88 kg.   DVT prophylaxis: On Eliquis, on hold Code Status: Full Code Family Communication: No family at bedside, updated wife on telephone yesterday Disposition Plan:  Status is: Inpatient  Remains inpatient appropriate because:IV treatments appropriate due to intensity of illness or inability to take PO  Dispo: The patient is from: Home              Anticipated d/c is to: Home              Patient currently is not medically stable to d/c.   Difficult to place patient No   Consultants:  Cardiology  General surgery  Procedures:  None  Antimicrobials:    Subjective: -Breathing is back to baseline, still continues to have mild abdominal discomfort, no nausea or vomiting, tolerating clears, having flatus, no BM   Objective: Vitals:   11/06/20 2019 11/06/20 2216 11/07/20 0518 11/07/20 0907  BP:  137/67 138/66   Pulse: 61 60 61   Resp: 18 18 18    Temp:  98.3 F (36.8 C) 98.5 F (36.9 C)   TempSrc:  Oral Oral   SpO2: 97% 96% 96% 95%  Weight:      Height:        Intake/Output Summary (Last 24 hours) at 11/07/2020 1050 Last data filed at 11/07/2020 0900 Gross per 24 hour  Intake 2446.75 ml  Output --  Net 2446.75 ml    [employer]  10/31/20 0229  Weight: 88 kg    Examination: Gen: AAOx3, no distress HEENT: No JVD CVS: S1-S2, regular rate rhythm Lungs: Clear today, no wheezing Abdomen: Soft, mildly distended, nontender, bowel sounds present Extremities: No edema  Skin: no new rashes on exposed skin     Data Reviewed: I have personally reviewed following labs and imaging  studies  CBC: Recent Labs  Lab 11/03/20 0416 11/04/20 0141 11/05/20 0236 11/06/20 0229 11/07/20 0324  WBC 16.3* 15.7* 15.9* 13.0* 15.3*  HGB 16.3 16.5 15.8 15.9 15.4  HCT 47.4 48.0 46.1 45.2 44.7  MCV 100.2* 99.4 100.2* 98.7 98.7  PLT 152 164 163 141* 027*   Basic Metabolic Panel: Recent Labs  Lab 11/03/20 0416 11/04/20 0141 11/05/20 0236 11/06/20 0229 11/07/20 0324  NA 138 137 136 135 136  K 4.5 4.7 5.2* 5.6* 5.1  CL 106 101 101 100 103  CO2 26 30 31 30 30   GLUCOSE 184* 183* 167* 165* 132*  BUN 22 26* 29* 30* 26*  CREATININE 0.79 0.95 0.93 0.92 0.77  CALCIUM 8.6* 8.7* 8.5* 8.1* 7.8*   GFR: Estimated Creatinine Clearance: 97.4 mL/min (by C-G formula based on SCr of 0.77 mg/dL). Liver Function Tests: No results for input(s): AST, ALT, ALKPHOS, BILITOT, PROT, ALBUMIN in the last 168 hours. No results for input(s): LIPASE, AMYLASE in the last 168 hours. No results for input(s): AMMONIA in the last 168 hours. Coagulation Profile: No results for input(s): INR, PROTIME in the last 168 hours. Cardiac Enzymes: No results for input(s): CKTOTAL, CKMB, CKMBINDEX, TROPONINI in the last 168 hours. BNP (last 3 results) No results for input(s): PROBNP in the last 8760 hours. HbA1C: No results for input(s): HGBA1C in the last 72 hours. CBG: No results for input(s): GLUCAP in the last 168 hours. Lipid Profile: No results for input(s): CHOL, HDL, LDLCALC, TRIG, CHOLHDL, LDLDIRECT in the last 72 hours. Thyroid Function Tests: No results for input(s): TSH, T4TOTAL, FREET4, T3FREE, THYROIDAB in the last 72 hours. Anemia Panel: No results for input(s): VITAMINB12, FOLATE, FERRITIN, TIBC, IRON, RETICCTPCT in the last 72 hours.  Sepsis Labs: No results for input(s): PROCALCITON, LATICACIDVEN in the last 168 hours.  Recent Results (from the past 240 hour(s))  SARS CORONAVIRUS 2 (TAT 6-24 HRS) Nasopharyngeal Nasopharyngeal Swab     Status: None   Collection Time: 10/31/20  2:44 AM    Specimen: Nasopharyngeal Swab  Result Value Ref Range Status   SARS Coronavirus 2 NEGATIVE NEGATIVE Final    Comment: (NOTE) SARS-CoV-2 target nucleic acids are NOT DETECTED.  The SARS-CoV-2 RNA is generally detectable in upper and lower respiratory specimens during the acute phase of infection. Negative results do not preclude SARS-CoV-2 infection, do not rule out co-infections with other pathogens, and should not be used as the sole basis for treatment or other patient management decisions. Negative results must be combined with clinical observations, patient history, and epidemiological information. The expected result is Negative.  Fact Sheet for Patients: SugarRoll.be  Fact Sheet for Healthcare Providers: https://www.woods-mathews.com/  This test is not yet approved or cleared by the [place] FDA and  has been authorized for detection and/or diagnosis of SARS-CoV-2 by FDA under an Emergency Use Authorization (EUA). This EUA will remain  in effect (meaning this test can be used) for the duration of the COVID-19 declaration under Se ction 564(b)(1) of the Act, 21 U.S.C. section 360bbb-3(b)(1), unless the authorization is terminated or revoked sooner.  Performed at [facility], [place]  [address] [place], [place] [address]   Resp Panel by RT-PCR (Flu A&B, Covid) Nasopharyngeal Swab     Status: None   Collection Time: 10/31/20 10:32 AM   Specimen: Nasopharyngeal Swab; Nasopharyngeal(NP) swabs in vial transport medium  Result Value Ref Range Status   SARS Coronavirus 2 by RT PCR NEGATIVE NEGATIVE Final    Comment: (NOTE) SARS-CoV-2 target nucleic acids are NOT DETECTED.  The SARS-CoV-2 RNA is generally detectable in upper respiratory specimens during the acute phase of infection. The lowest concentration of SARS-CoV-2 viral copies this assay can detect is 138 copies/mL. A negative result does not preclude  SARS-Cov-2 infection and should not be used as the sole basis for treatment or other patient management decisions. A negative result may occur with  improper specimen collection/handling, submission of specimen other than nasopharyngeal swab, presence of viral mutation(s) within the areas targeted by this assay, and inadequate number of viral copies(<138 copies/mL). A negative result must be combined with clinical observations, patient history, and epidemiological information. The expected result is Negative.  Fact Sheet for Patients:  EntrepreneurPulse.com.au  Fact Sheet for Healthcare Providers:  IncredibleEmployment.be  This test is no t yet approved or cleared by the [place] FDA and  has been authorized for detection and/or diagnosis of SARS-CoV-2 by FDA under an Emergency Use Authorization (EUA). This EUA will remain  in effect (meaning this test can be used) for the duration of the COVID-19 declaration under Section 564(b)(1) of the Act, 21 U.S.C.section 360bbb-3(b)(1), unless the authorization is terminated  or revoked sooner.       Influenza A by PCR NEGATIVE NEGATIVE Final   Influenza B by PCR NEGATIVE NEGATIVE Final    Comment: (NOTE) The Xpert Xpress SARS-CoV-2/FLU/RSV plus assay is intended as an aid in the diagnosis of influenza from Nasopharyngeal swab specimens and should not be used as a sole basis for treatment. Nasal washings and aspirates are unacceptable for Xpert Xpress SARS-CoV-2/FLU/RSV testing.  Fact Sheet for Patients: EntrepreneurPulse.com.au  Fact Sheet for Healthcare Providers: IncredibleEmployment.be  This test is not yet approved or cleared by the [place] FDA and has been authorized for detection and/or diagnosis of SARS-CoV-2 by FDA under an Emergency Use Authorization (EUA). This EUA will remain in effect (meaning this test can be used) for the duration of  the COVID-19 declaration under Section 564(b)(1) of the Act, 21 U.S.C. section 360bbb-3(b)(1), unless the authorization is terminated or revoked.  Performed at [facility], [place] [address], [place], [place] [address]          Radiology Studies: DG Abd 1 View  Result Date: 11/05/2020 CLINICAL DATA:  Small bowel obstruction, nausea and vomiting EXAM: ABDOMEN - 1 VIEW COMPARISON:  11/04/2020 FINDINGS: Interval increase in diffuse gas distention of the small bowel in the mid abdomen, loops measuring up to 6.7 cm. There is gas and stool present throughout the colon to the rectum. No free air in the abdomen on supine radiographs. IMPRESSION: 1. Interval increase in diffuse gas distention of the small bowel in the mid abdomen, loops measuring up to 6.7 cm. There is gas and stool present throughout the colon to the rectum. 2.  No free air in the abdomen on supine radiographs. Electronically Signed   By: Eddie Candle M.D.   On: 11/05/2020 14:02   DG Abd Portable 1V  Result Date: 11/06/2020 CLINICAL DATA:  RIGHT lower quadrant pain with nausea. EXAM: PORTABLE ABDOMEN - 1 VIEW COMPARISON:  Plain film of the abdomen dated  11/05/2020. FINDINGS: Persistently distended gas-filled loops of small bowel throughout the abdomen and upper pelvis. Air is seen within the relatively decompressed colon. Cholecystectomy clips in the RIGHT upper quadrant. No evidence of free intraperitoneal air. IMPRESSION: Continued evidence of small-bowel obstruction, not significantly changed compared to most recent chest x-ray of 11/05/2020 but increased small bowel distension compared to earlier plain film of 11/04/2020. Associated transition zone demonstrated in the central anterior abdomen on CT abdomen of 11/04/2020. Electronically Signed   By: Franki Cabot M.D.   On: 11/06/2020 10:46        Scheduled Meds:  diltiazem  120 mg Oral Daily   diphenhydrAMINE  50 mg Oral Once   Or   diphenhydrAMINE  50 mg Intravenous  Once   guaiFENesin  1,200 mg Oral BID   ipratropium-albuterol  3 mL Nebulization TID   isosorbide mononitrate  30 mg Oral Daily   loratadine  10 mg Oral Daily   nicotine  21 mg Transdermal Daily   pantoprazole (PROTONIX) IV  40 mg Intravenous Q12H   [START ON 11/08/2020] predniSONE  30 mg Oral Q breakfast   rosuvastatin  20 mg Oral Daily   sertraline  50 mg Oral Daily   sodium chloride flush  3 mL Intravenous Q12H   umeclidinium-vilanterol  1 puff Inhalation Daily   vitamin B-12  100 mcg Oral Daily   Continuous Infusions:  sodium chloride     sodium chloride 50 mL/hr at 11/07/20 0831   promethazine (PHENERGAN) injection (IM or IVPB) 12.5 mg (11/04/20 1814)     LOS: 7 days   Time spent: 16min  Domenic Polite, MD [facility]  11/07/2020, 10:50 AM

## 2020-11-07 NOTE — Progress Notes (Signed)
Subjective/Chief Complaint: Flatus, no BM No nausea with clear liquids Does not feel bloated  Objective: Vital signs in last 24 hours: Temp:  [98.3 F (36.8 C)-98.5 F (36.9 C)] 98.5 F (36.9 C) (07/10 0518) Pulse Rate:  [56-68] 61 (07/10 0518) Resp:  [17-18] 18 (07/10 0518) BP: (125-138)/(60-67) 138/66 (07/10 0518) SpO2:  [93 %-98 %] 95 % (07/10 0907) Last BM Date: 11/06/20  Intake/Output from previous day: 07/09 0701 - 07/10 0700 In: 1174.3 [P.O.:480; I.V.:694.3] Out: -  Intake/Output this shift: Total I/O In: 1272.5 [P.O.:240; I.V.:1032.5] Out: -    WDWN in NAD Abd -soft, healed incision; non-tender - mildly distended  Lab Results:  Recent Labs    11/06/20 0229 11/07/20 0324  WBC 13.0* 15.3*  HGB 15.9 15.4  HCT 45.2 44.7  PLT 141* 134*   BMET Recent Labs    11/06/20 0229 11/07/20 0324  NA 135 136  K 5.6* 5.1  CL 100 103  CO2 30 30  GLUCOSE 165* 132*  BUN 30* 26*  CREATININE 0.92 0.77  CALCIUM 8.1* 7.8*   PT/INR No results for input(s): LABPROT, INR in the last 72 hours. ABG No results for input(s): PHART, HCO3 in the last 72 hours.  Invalid input(s): PCO2, PO2  Studies/Results: DG Abd 1 View  Result Date: 11/05/2020 CLINICAL DATA:  Small bowel obstruction, nausea and vomiting EXAM: ABDOMEN - 1 VIEW COMPARISON:  11/04/2020 FINDINGS: Interval increase in diffuse gas distention of the small bowel in the mid abdomen, loops measuring up to 6.7 cm. There is gas and stool present throughout the colon to the rectum. No free air in the abdomen on supine radiographs. IMPRESSION: 1. Interval increase in diffuse gas distention of the small bowel in the mid abdomen, loops measuring up to 6.7 cm. There is gas and stool present throughout the colon to the rectum. 2.  No free air in the abdomen on supine radiographs. Electronically Signed   By: Eddie Candle M.D.   On: 11/05/2020 14:02   DG Abd Portable 1V  Result Date: 11/06/2020 CLINICAL DATA:  RIGHT lower  quadrant pain with nausea. EXAM: PORTABLE ABDOMEN - 1 VIEW COMPARISON:  Plain film of the abdomen dated 11/05/2020. FINDINGS: Persistently distended gas-filled loops of small bowel throughout the abdomen and upper pelvis. Air is seen within the relatively decompressed colon. Cholecystectomy clips in the RIGHT upper quadrant. No evidence of free intraperitoneal air. IMPRESSION: Continued evidence of small-bowel obstruction, not significantly changed compared to most recent chest x-ray of 11/05/2020 but increased small bowel distension compared to earlier plain film of 11/04/2020. Associated transition zone demonstrated in the central anterior abdomen on CT abdomen of 11/04/2020. Electronically Signed   By: Franki Cabot M.D.   On: 11/06/2020 10:46    Anti-infectives: Anti-infectives (From admission, onward)    Start     Dose/Rate Route Frequency Ordered Stop   11/02/20 1115  doxycycline (VIBRA-TABS) tablet 100 mg  Status:  Discontinued        100 mg Oral Every 12 hours 11/02/20 1027 11/04/20 0930       Assessment/Plan: PSBO - resolved - history of laparoscopic cholecystectomy and VHR with mesh >10 years ago - CT with small bowel obstruction with transition point in the central anterior abdomen, likely secondary to adhesions. - initial symptoms of nausea, emesis, abdominal pain have all improved; resume clear liquids - If his symptoms worsen, we will consider surgery - ambulate today - we will follow   FEN: clear liquids IVF per primary  ID: none VTE: eliquis, plavix - Hold Plavix in case he needs surgery this admission   Per primary: Acute COPD exacerbation. Acute hypoxic respiratory failure History of CAD, chest tightness, PVCs Demand ischemia Paroxysmal atrial fibrillation Depression Tobacco abuse Macrocytosis   Discussed with his wife, who seems adamant that he have surgery this admission.  He has had intermittent abdominal symptoms over the last year.  I explained to her that he  was a high risk surgical candidate because of his other comorbidities and that he currently had no indications for surgery.     LOS: 7 days    Brady Brooks 11/07/2020

## 2020-11-08 ENCOUNTER — Inpatient Hospital Stay (HOSPITAL_COMMUNITY): Payer: Medicare Other

## 2020-11-08 DIAGNOSIS — J441 Chronic obstructive pulmonary disease with (acute) exacerbation: Secondary | ICD-10-CM | POA: Diagnosis not present

## 2020-11-08 LAB — BASIC METABOLIC PANEL
Anion gap: 7 (ref 5–15)
BUN: 16 mg/dL (ref 8–23)
CO2: 26 mmol/L (ref 22–32)
Calcium: 7.7 mg/dL — ABNORMAL LOW (ref 8.9–10.3)
Chloride: 98 mmol/L (ref 98–111)
Creatinine, Ser: 0.87 mg/dL (ref 0.61–1.24)
GFR, Estimated: >60 mL/min (ref >60)
Glucose, Bld: 134 mg/dL — ABNORMAL HIGH (ref 70–99)
Potassium: 4 mmol/L (ref 3.5–5.1)
Sodium: 131 mmol/L — ABNORMAL LOW (ref 135–145)

## 2020-11-08 LAB — CBC
HCT: 46.8 % (ref 39.0–52.0)
Hemoglobin: 16.4 g/dL (ref 13.0–17.0)
MCH: 34.5 pg — ABNORMAL HIGH (ref 26.0–34.0)
MCHC: 35 g/dL (ref 30.0–36.0)
MCV: 98.3 fL (ref 80.0–100.0)
Platelets: 121 10*3/uL — ABNORMAL LOW (ref 150–400)
RBC: 4.76 MIL/uL (ref 4.22–5.81)
RDW: 12.1 % (ref 11.5–15.5)
WBC: 11.2 10*3/uL — ABNORMAL HIGH (ref 4.0–10.5)
nRBC: 0 % (ref 0.0–0.2)

## 2020-11-08 MED ORDER — APIXABAN 5 MG PO TABS
5.0000 mg | ORAL_TABLET | Freq: Two times a day (BID) | ORAL | Status: DC
  Administered 2020-11-08 – 2020-11-09 (×3): 5 mg via ORAL
  Filled 2020-11-08 (×3): qty 1

## 2020-11-08 MED ORDER — PREDNISONE 20 MG PO TABS
20.0000 mg | ORAL_TABLET | Freq: Every day | ORAL | Status: DC
  Administered 2020-11-09: 20 mg via ORAL
  Filled 2020-11-08: qty 1

## 2020-11-08 MED ORDER — PANTOPRAZOLE SODIUM 40 MG PO TBEC
40.0000 mg | DELAYED_RELEASE_TABLET | Freq: Every day | ORAL | Status: DC
  Administered 2020-11-08: 40 mg via ORAL
  Filled 2020-11-08: qty 1

## 2020-11-08 NOTE — Progress Notes (Addendum)
PROGRESS NOTE    AVORY MIMBS Sr.  YKD:[id number] DOB: [birth year] DOA: 10/31/2020 PCP: Billie Ruddy, MD   Brief Narrative: 70 year old with past medical history significant for tobacco abuse, COPD, seasonal allergies, PVD/CAD who presents with 1 or 2 weeks progressive worsening shortness of breath and chest pain/tightness. Patient was admitted for COPD exacerbation. -Had intermittent chest pain this admission, 2D echo noted EF of 40-45% with wall motion abnormalities, cardiology consulted, recommended outpatient ischemic evaluation -Early a.m. 7/7 with abdominal distention, bloating, vomiting   Assessment & Plan:   Acute COPD exacerbation.  Acute hypoxic respiratory failure -Improved, no further wheezing, transition to prednisone taper -Continue duo nebs, will need ambulatory O2 sats checked at discharge  History of CAD, chest tightness, PVCs Demand ischemia -Troponin elevation, 2D echo with EF of 40-45% with regional wall motion abnormalities -Cardiology consulted, recommended ischemic evaluation likely cath in the near future after he has recovered from acute COPD exacerbation -Continue Crestor, restart Plavix tomorrow  PSBO -CT 7/7 evening was concerning for SBO  -Treated with bowel rest and supportive care, did not require NG decompression -Appreciate surgical input, now resolving, having BMs  -Advance diet  Paroxysmal atrial fibrillation -Intermittent episodes of A. fib, PVCs -Started on low-dose diltiazem for rate control by cards, cannot tolerate beta-blocker due to active COPD exacerbation -Started on Eliquis by cardiology this admission, restart Eliquis -cards recommends Zio patch at DC to determine Afib burden  Depression;  -Appreciate psych input, continue Zoloft and nicotine patch  Tobacco abuse -100-pack-year smoking history -Counsel, nicotine patch  Macrocytosis:  -B 12  320. Started B 12 supplement.    Estimated body mass index is 27.84 kg/m as  calculated from the following:   Height as of this encounter: 5\' 10"  (1.778 m).   Weight as of this encounter: 88 kg.   DVT prophylaxis: On Eliquis, on hold Code Status: Full Code Family Communication: No family at bedside Disposition Plan:  Status is: Inpatient  Remains inpatient appropriate because:IV treatments appropriate due to intensity of illness or inability to take PO  Dispo: The patient is from: Home              Anticipated d/c is to: Home              Patient currently is not medically stable to d/c.   Difficult to place patient No   Consultants:  Cardiology  General surgery  Procedures:  None  Antimicrobials:    Subjective: -Feels better overall, no nausea or vomiting, having BMs now   Objective: Vitals:   11/07/20 2156 11/08/20 0049 11/08/20 0510 11/08/20 0839  BP:  128/63 129/66   Pulse:  62 61   Resp:  18 18   Temp:  98.2 F (36.8 C) 98.7 F (37.1 C)   TempSrc:  Oral Oral   SpO2: 95% 97% 95% 95%  Weight:      Height:        Intake/Output Summary (Last 24 hours) at 11/08/2020 1205 Last data filed at 11/08/2020 0700 Gross per 24 hour  Intake 1170.05 ml  Output 700 ml  Net 470.05 ml    Filed Weights   10/31/20 0229  Weight: 88 kg    Examination: Gen: Awake, Alert, Oriented X 3, no distress HEENT: no JVD Lungs: Proving air movement, no wheezing CVS: S1S2/RRR Abd: Soft, less distended, nontender, bowel sounds present Extremities: No edema Skin: no new rashes on exposed skin     Data Reviewed: I have personally reviewed  following labs and imaging studies  CBC: Recent Labs  Lab 11/04/20 0141 11/05/20 0236 11/06/20 0229 11/07/20 0324 11/08/20 0748  WBC 15.7* 15.9* 13.0* 15.3* 11.2*  HGB 16.5 15.8 15.9 15.4 16.4  HCT 48.0 46.1 45.2 44.7 46.8  MCV 99.4 100.2* 98.7 98.7 98.3  PLT 164 163 141* 134* 101*   Basic Metabolic Panel: Recent Labs  Lab 11/04/20 0141 11/05/20 0236 11/06/20 0229 11/07/20 0324 11/08/20 0748  NA  137 136 135 136 131*  K 4.7 5.2* 5.6* 5.1 4.0  CL 101 101 100 103 98  CO2 30 31 30 30 26   GLUCOSE 183* 167* 165* 132* 134*  BUN 26* 29* 30* 26* 16  CREATININE 0.95 0.93 0.92 0.77 0.87  CALCIUM 8.7* 8.5* 8.1* 7.8* 7.7*   GFR: Estimated Creatinine Clearance: 89.5 mL/min (by C-G formula based on SCr of 0.87 mg/dL). Liver Function Tests: No results for input(s): AST, ALT, ALKPHOS, BILITOT, PROT, ALBUMIN in the last 168 hours. No results for input(s): LIPASE, AMYLASE in the last 168 hours. No results for input(s): AMMONIA in the last 168 hours. Coagulation Profile: No results for input(s): INR, PROTIME in the last 168 hours. Cardiac Enzymes: No results for input(s): CKTOTAL, CKMB, CKMBINDEX, TROPONINI in the last 168 hours. BNP (last 3 results) No results for input(s): PROBNP in the last 8760 hours. HbA1C: No results for input(s): HGBA1C in the last 72 hours. CBG: No results for input(s): GLUCAP in the last 168 hours. Lipid Profile: No results for input(s): CHOL, HDL, LDLCALC, TRIG, CHOLHDL, LDLDIRECT in the last 72 hours. Thyroid Function Tests: No results for input(s): TSH, T4TOTAL, FREET4, T3FREE, THYROIDAB in the last 72 hours. Anemia Panel: No results for input(s): VITAMINB12, FOLATE, FERRITIN, TIBC, IRON, RETICCTPCT in the last 72 hours.  Sepsis Labs: No results for input(s): PROCALCITON, LATICACIDVEN in the last 168 hours.  Recent Results (from the past 240 hour(s))  SARS CORONAVIRUS 2 (TAT 6-24 HRS) Nasopharyngeal Nasopharyngeal Swab     Status: None   Collection Time: 10/31/20  2:44 AM   Specimen: Nasopharyngeal Swab  Result Value Ref Range Status   SARS Coronavirus 2 NEGATIVE NEGATIVE Final    Comment: (NOTE) SARS-CoV-2 target nucleic acids are NOT DETECTED.  The SARS-CoV-2 RNA is generally detectable in upper and lower respiratory specimens during the acute phase of infection. Negative results do not preclude SARS-CoV-2 infection, do not rule out co-infections  with other pathogens, and should not be used as the sole basis for treatment or other patient management decisions. Negative results must be combined with clinical observations, patient history, and epidemiological information. The expected result is Negative.  Fact Sheet for Patients: SugarRoll.be  Fact Sheet for Healthcare Providers: https://www.woods-mathews.com/  This test is not yet approved or cleared by the [place] FDA and  has been authorized for detection and/or diagnosis of SARS-CoV-2 by FDA under an Emergency Use Authorization (EUA). This EUA will remain  in effect (meaning this test can be used) for the duration of the COVID-19 declaration under Se ction 564(b)(1) of the Act, 21 U.S.C. section 360bbb-3(b)(1), unless the authorization is terminated or revoked sooner.  Performed at [facility], [place] [address], [place], [place] [address]   Resp Panel by RT-PCR (Flu A&B, Covid) Nasopharyngeal Swab     Status: None   Collection Time: 10/31/20 10:32 AM   Specimen: Nasopharyngeal Swab; Nasopharyngeal(NP) swabs in vial transport medium  Result Value Ref Range Status   SARS Coronavirus 2 by RT PCR NEGATIVE NEGATIVE Final  Comment: (NOTE) SARS-CoV-2 target nucleic acids are NOT DETECTED.  The SARS-CoV-2 RNA is generally detectable in upper respiratory specimens during the acute phase of infection. The lowest concentration of SARS-CoV-2 viral copies this assay can detect is 138 copies/mL. A negative result does not preclude SARS-Cov-2 infection and should not be used as the sole basis for treatment or other patient management decisions. A negative result may occur with  improper specimen collection/handling, submission of specimen other than nasopharyngeal swab, presence of viral mutation(s) within the areas targeted by this assay, and inadequate number of viral copies(<138 copies/mL). A negative result must be combined  with clinical observations, patient history, and epidemiological information. The expected result is Negative.  Fact Sheet for Patients:  EntrepreneurPulse.com.au  Fact Sheet for Healthcare Providers:  IncredibleEmployment.be  This test is no t yet approved or cleared by the [place] FDA and  has been authorized for detection and/or diagnosis of SARS-CoV-2 by FDA under an Emergency Use Authorization (EUA). This EUA will remain  in effect (meaning this test can be used) for the duration of the COVID-19 declaration under Section 564(b)(1) of the Act, 21 U.S.C.section 360bbb-3(b)(1), unless the authorization is terminated  or revoked sooner.       Influenza A by PCR NEGATIVE NEGATIVE Final   Influenza B by PCR NEGATIVE NEGATIVE Final    Comment: (NOTE) The Xpert Xpress SARS-CoV-2/FLU/RSV plus assay is intended as an aid in the diagnosis of influenza from Nasopharyngeal swab specimens and should not be used as a sole basis for treatment. Nasal washings and aspirates are unacceptable for Xpert Xpress SARS-CoV-2/FLU/RSV testing.  Fact Sheet for Patients: EntrepreneurPulse.com.au  Fact Sheet for Healthcare Providers: IncredibleEmployment.be  This test is not yet approved or cleared by the [place] FDA and has been authorized for detection and/or diagnosis of SARS-CoV-2 by FDA under an Emergency Use Authorization (EUA). This EUA will remain in effect (meaning this test can be used) for the duration of the COVID-19 declaration under Section 564(b)(1) of the Act, 21 U.S.C. section 360bbb-3(b)(1), unless the authorization is terminated or revoked.  Performed at [facility], [place] [address], [place], [place] [address]          Radiology Studies: DG Abd Portable 2V  Result Date: 11/08/2020 CLINICAL DATA:  Small bowel obstruction. EXAM: PORTABLE ABDOMEN - 2 VIEW COMPARISON:  11/06/2020.  FINDINGS: Persistent but minimally improved small bowel dilatation. There is oral contrast and stool throughout the colon. IMPRESSION: Slight improvement in small bowel obstruction. Electronically Signed   By: Lorin Picket M.D.   On: 11/08/2020 08:27        Scheduled Meds:  diltiazem  120 mg Oral Daily   diphenhydrAMINE  50 mg Oral Once   Or   diphenhydrAMINE  50 mg Intravenous Once   guaiFENesin  1,200 mg Oral BID   isosorbide mononitrate  30 mg Oral Daily   loratadine  10 mg Oral Daily   nicotine  21 mg Transdermal Daily   pantoprazole  40 mg Oral Q1200   [START ON 11/09/2020] predniSONE  20 mg Oral Q breakfast   rosuvastatin  20 mg Oral Daily   sertraline  50 mg Oral Daily   sodium chloride flush  3 mL Intravenous Q12H   umeclidinium-vilanterol  1 puff Inhalation Daily   vitamin B-12  100 mcg Oral Daily   Continuous Infusions:  sodium chloride     promethazine (PHENERGAN) injection (IM or IVPB) 12.5 mg (11/04/20 1814)     LOS:  8 days   Time spent: 55min  Domenic Polite, MD [facility]  11/08/2020, 12:05 PM

## 2020-11-08 NOTE — Progress Notes (Addendum)
Progress Note     Subjective: CC: feeling well. Tolerating soft diet and had BM this am. Ambulating. No nausea, emesis, abdominal pain   Objective: Vital signs in last 24 hours: Temp:  [97.5 F (36.4 C)-98.7 F (37.1 C)] 98.7 F (37.1 C) (07/11 0510) Pulse Rate:  [61-78] 61 (07/11 0510) Resp:  [18-20] 18 (07/11 0510) BP: (128-158)/(63-70) 129/66 (07/11 0510) SpO2:  [91 %-97 %] 95 % (07/11 0839) Last BM Date: 11/07/20  Intake/Output from previous day: 07/10 0701 - 07/11 0700 In: 2442.5 [P.O.:960; I.V.:1482.5] Out: 1000 [Urine:1000] Intake/Output this shift: No intake/output data recorded.  PE: General: pleasant, WD, male who is sitting up in bed in NAD HEENT: head is normocephalic, atraumatic.  Mouth is pink and moist Heart: Palpable radial pulses bilaterally Lungs:  Respiratory effort nonlabored Abd: soft, NT, ND, +BS, well healed umbilical scar MS: all 4 extremities are symmetrical with no cyanosis, clubbing, or edema. Skin: warm and dry with no masses, lesions, or rashes Psych: A&Ox3 with an appropriate affect.    Lab Results:  Recent Labs    11/07/20 0324 11/08/20 0748  WBC 15.3* 11.2*  HGB 15.4 16.4  HCT 44.7 46.8  PLT 134* 121*   BMET Recent Labs    11/07/20 0324 11/08/20 0748  NA 136 131*  K 5.1 4.0  CL 103 98  CO2 30 26  GLUCOSE 132* 134*  BUN 26* 16  CREATININE 0.77 0.87  CALCIUM 7.8* 7.7*   PT/INR No results for input(s): LABPROT, INR in the last 72 hours. CMP     Component Value Date/Time   NA 131 (L) 11/08/2020 0748   NA 141 12/26/2019 1413   K 4.0 11/08/2020 0748   CL 98 11/08/2020 0748   CO2 26 11/08/2020 0748   GLUCOSE 134 (H) 11/08/2020 0748   BUN 16 11/08/2020 0748   BUN 14 12/26/2019 1413   CREATININE 0.87 11/08/2020 0748   CREATININE 0.77 09/12/2016 0821   CALCIUM 7.7 (L) 11/08/2020 0748   PROT 6.8 11/17/2019 1403   ALBUMIN 4.4 11/17/2019 1403   AST 38 11/17/2019 1403   ALT 30 11/17/2019 1403   ALKPHOS 71  11/17/2019 1403   BILITOT 0.3 11/17/2019 1403   GFRNONAA >60 11/08/2020 0748   GFRNONAA >60 12/01/2010 1127   GFRAA 101 12/26/2019 1413   GFRAA >60 12/01/2010 1127   Lipase  No results found for: LIPASE     Studies/Results: DG Abd Portable 2V  Result Date: 11/08/2020 CLINICAL DATA:  Small bowel obstruction. EXAM: PORTABLE ABDOMEN - 2 VIEW COMPARISON:  11/06/2020. FINDINGS: Persistent but minimally improved small bowel dilatation. There is oral contrast and stool throughout the colon. IMPRESSION: Slight improvement in small bowel obstruction. Electronically Signed   By: Lorin Picket M.D.   On: 11/08/2020 08:27    Anti-infectives: Anti-infectives (From admission, onward)    Start     Dose/Rate Route Frequency Ordered Stop   11/02/20 1115  doxycycline (VIBRA-TABS) tablet 100 mg  Status:  Discontinued        100 mg Oral Every 12 hours 11/02/20 1027 11/04/20 0930        Assessment/Plan  PSBO - resolved - history of laparoscopic cholecystectomy and VHR with mesh >10 years ago - CT with small bowel obstruction with transition point in the central anterior abdomen, likely secondary to adhesions. - symptoms resolved and tolerating soft diet - can resume plavix on discharge from our standpoint - reviewed return precautions  Stable for discharge from surgical perspective. We  will sign off but please contact us with any questions or concerns   FEN: soft, IVF per primary ID: none VTE: eliquis, plavix    Per primary: Acute COPD exacerbation. Acute hypoxic respiratory failure History of CAD, chest tightness, PVCs Demand ischemia Paroxysmal atrial fibrillation Depression Tobacco abuse Macrocytosis     LOS: 8 days    Winferd Humphrey, [facility] Surgery 11/08/2020, 10:08 AM Please see Amion for pager number during day hours 7:00am-4:30pm

## 2020-11-08 NOTE — Telephone Encounter (Signed)
Pt was notified of Dr. Janee Morn response via My Chart message. Nothing further needed at this time.

## 2020-11-08 NOTE — Plan of Care (Signed)

## 2020-11-09 ENCOUNTER — Inpatient Hospital Stay (HOSPITAL_COMMUNITY)
Admit: 2020-11-09 | Discharge: 2020-11-09 | Disposition: A | Discharge status: Home / Self Care | Payer: Medicare Other | Attending: Cardiovascular Disease | Admitting: Cardiovascular Disease

## 2020-11-09 DIAGNOSIS — J441 Chronic obstructive pulmonary disease with (acute) exacerbation: Secondary | ICD-10-CM | POA: Diagnosis not present

## 2020-11-09 DIAGNOSIS — I480 Paroxysmal atrial fibrillation: Secondary | ICD-10-CM

## 2020-11-09 LAB — CBC
HCT: 44.9 % (ref 39.0–52.0)
Hemoglobin: 15.9 g/dL (ref 13.0–17.0)
MCH: 34.3 pg — ABNORMAL HIGH (ref 26.0–34.0)
MCHC: 35.4 g/dL (ref 30.0–36.0)
MCV: 96.8 fL (ref 80.0–100.0)
Platelets: 108 10*3/uL — ABNORMAL LOW (ref 150–400)
RBC: 4.64 MIL/uL (ref 4.22–5.81)
RDW: 12 % (ref 11.5–15.5)
WBC: 11.6 10*3/uL — ABNORMAL HIGH (ref 4.0–10.5)
nRBC: 0 % (ref 0.0–0.2)

## 2020-11-09 LAB — BASIC METABOLIC PANEL
Anion gap: 5 (ref 5–15)
BUN: 13 mg/dL (ref 8–23)
CO2: 27 mmol/L (ref 22–32)
Calcium: 7.7 mg/dL — ABNORMAL LOW (ref 8.9–10.3)
Chloride: 101 mmol/L (ref 98–111)
Creatinine, Ser: 0.7 mg/dL (ref 0.61–1.24)
GFR, Estimated: >60 mL/min (ref >60)
Glucose, Bld: 124 mg/dL — ABNORMAL HIGH (ref 70–99)
Potassium: 4.1 mmol/L (ref 3.5–5.1)
Sodium: 133 mmol/L — ABNORMAL LOW (ref 135–145)

## 2020-11-09 MED ORDER — ISOSORBIDE MONONITRATE ER 30 MG PO TB24: 30.0000 mg | ORAL_TABLET | Freq: Every day | ORAL | 0 refills | Status: DC

## 2020-11-09 MED ORDER — NICOTINE 14 MG/24HR TD PT24: 14.0000 mg | MEDICATED_PATCH | Freq: Every day | TRANSDERMAL | 0 refills | Status: DC

## 2020-11-09 MED ORDER — APIXABAN 5 MG PO TABS: 5.0000 mg | ORAL_TABLET | Freq: Two times a day (BID) | ORAL | 0 refills | Status: DC

## 2020-11-09 MED ORDER — PREDNISONE 20 MG PO TABS: 20.0000 mg | ORAL_TABLET | Freq: Every day | ORAL | 0 refills | Status: AC

## 2020-11-09 MED ORDER — DILTIAZEM HCL ER COATED BEADS 120 MG PO CP24: 120.0000 mg | ORAL_CAPSULE | Freq: Every day | ORAL | 0 refills | Status: DC

## 2020-11-09 MED ORDER — SERTRALINE HCL 50 MG PO TABS: 50.0000 mg | ORAL_TABLET | Freq: Every day | ORAL | 0 refills | Status: DC

## 2020-11-09 NOTE — Progress Notes (Signed)
Discharge teaching complete. Meds, diet, activity, follow up appointments reviewed and all questions answered. Copy of instructions given to patient and prescriptions sent to pharmacy. Free 30 day Eliquis card given to patient.

## 2020-11-09 NOTE — Care Management Important Message (Signed)
Important Message  Patient Details  Name: Brady SHIMABUKURO Sr. MRN: [id number] Date of Birth: Apr 15, 1951   Medicare Important Message Given:  Yes     Barb Merino Baldwin 11/09/2020, 1:22 PM

## 2020-11-10 DIAGNOSIS — I480 Paroxysmal atrial fibrillation: Secondary | ICD-10-CM | POA: Diagnosis not present

## 2020-11-11 ENCOUNTER — Telehealth: Payer: Self-pay

## 2020-11-11 NOTE — Telephone Encounter (Signed)
Transition Care Management Unsuccessful Follow-up Telephone Call  Date of discharge and from where:  [facility]  Attempts:  2nd Attempt  Reason for unsuccessful TCM follow-up call:  No answer/busy

## 2020-11-11 NOTE — Telephone Encounter (Signed)
Transition Care Management Unsuccessful Follow-up Telephone Call  Date of discharge and from where:  [facility]  11/09/2020  Attempts:  2nd Attempt   Reason for unsuccessful TCM follow-up call:  No answer/busy

## 2020-11-15 DIAGNOSIS — K56600 Partial intestinal obstruction, unspecified as to cause: Secondary | ICD-10-CM | POA: Diagnosis not present

## 2020-11-15 DIAGNOSIS — Z86010 Personal history of colonic polyps: Secondary | ICD-10-CM | POA: Diagnosis not present

## 2020-11-15 DIAGNOSIS — I4891 Unspecified atrial fibrillation: Secondary | ICD-10-CM | POA: Diagnosis not present

## 2020-11-17 NOTE — Discharge Summary (Addendum)
Physician Discharge Summary  Brady Brooks Sr. WOE:[id number] DOB: 10/06/1950 DOA: 10/31/2020  PCP: Brady Ruddy, MD  Admit date: 10/31/2020 Discharge date: 11/09/2020  Time spent: 35 minutes  Recommendations for Outpatient Follow-up:  Cardiology Beckie Busing on 8/19, consideration of ischemia evaluation PCP Dr. Volanda Napoleon in 1 week General surgery as needed  Discharge Diagnoses:  Principal Problem:   COPD with acute exacerbation ([name]) Cardiomyopathy, suspected ischemic Partial small bowel obstruction Paroxysmal atrial fibrillation   Coronary artery disease   Tobacco abuse   Essential hypertension   PVD (peripheral vascular disease) ([place])   Discharge Condition: Stable  Diet recommendation: Heart healthy  Filed Weights   10/31/20 0229  Weight: 88 kg    History of present illness:  70 year old with past medical history significant for tobacco abuse, COPD, seasonal allergies, PVD/CAD who presents with 1 or 2 weeks progressive worsening shortness of breath and chest pain/tightness. Patient was admitted for COPD exacerbation.  Hospital Course:   Acute COPD exacerbation. Acute hypoxic respiratory failure -Improved, no further wheezing, -Treated with steroids and nebs, supplemental oxygen -Completed steroid taper, continue nebs as needed after discharge, counseled regarding smoking cessation, weaned off oxygen by the time of discharge   History of CAD, chest tightness, PVCs, PACs Demand ischemia -Complains of intermittent chest pain this admission, noted to have mildly elevated high-sensitivity troponin, 2D echo with EF of 40-45% with regional wall motion abnormalities -Cardiology consulted, recommended ischemic evaluation likely cath in the near future after he has recovered from acute COPD exacerbation -Continue Crestor, Plavix -Follow-up with cardiology arranged in 4 weeks   PSBO -CT 7/7 evening was concerning for SBO  -Treated with bowel rest and supportive  care, did not require NG decompression -Seen by general surgery in consultation, improved with supportive care, having bowel movements, tolerating regular diet at the time of discharge   Paroxysmal atrial fibrillation -Intermittent episodes of A. fib, PVCs -Started on low-dose diltiazem for rate control by cardiology, cannot tolerate beta-blocker due to active COPD exacerbation -Started on Eliquis by cardiology this admission, this was continued at discharge -cards recommends Zio patch at DC to determine Afib burden   Depression; -Appreciate psych input, continue Zoloft and nicotine patch   Tobacco abuse -100-pack-year smoking history -Counseled, nicotine patch prescribed   Macrocytosis: -B 12  320. Started B 12 supplement.       Consultants:  Cardiology General surgery  Discharge Exam: Vitals:   11/08/20 2249 11/09/20 0451  BP: 127/71 123/64  Pulse: (!) 54 60  Resp: 18 16  Temp: 99.8 F (37.7 C) 99 F (37.2 C)  SpO2: 95% 96%    General: AAOx3 Cardiovascular: S1-S2, regular rate rhythm Respiratory: Clear  Discharge Instructions   Discharge Instructions     Diet - low sodium heart healthy   Complete by: As directed    Increase activity slowly   Complete by: As directed       Allergies as of 11/09/2020       Reactions   Amlodipine Cough   Aspirin Other (See Comments)   Sneezes, watery nose, wheeze (no polyps)    Contrast Media [iodinated Diagnostic Agents]    Need to be pre-medicated, sneezing, watery eyes   Erythromycin Hives   Childhood allergy All mycin drugs   Losartan Potassium Cough   Penicillins    Childhood allergy Has patient had a PCN reaction causing immediate rash, facial/tongue/throat swelling, SOB or lightheadedness with hypotension: Unknown Has patient had a PCN reaction causing severe rash involving mucus membranes  or skin necrosis: Unknown Has patient had a PCN reaction that required hospitalization: No Has patient had a PCN reaction  occurring within the last 10 years: No States he has had benadryl when taking this and had no problems recently    Simvastatin Other (See Comments)   Hip pain   Sulfa Antibiotics    unknown        Medication List     STOP taking these medications    spironolactone 25 MG tablet Commonly known as: ALDACTONE       TAKE these medications    albuterol 108 (90 Base) MCG/ACT inhaler Commonly known as: VENTOLIN HFA Inhale 2 puffs into the lungs every 6 (six) hours as needed for wheezing or shortness of breath.   apixaban 5 MG Tabs tablet Commonly known as: ELIQUIS Take 1 tablet (5 mg total) by mouth 2 (two) times daily.   CoQ10 100 MG Caps Take 100 mg by mouth daily.   diltiazem 120 MG 24 hr capsule Commonly known as: CARDIZEM CD Take 1 capsule (120 mg total) by mouth daily.   fexofenadine 180 MG tablet Commonly known as: ALLEGRA Take 180 mg by mouth daily.   fluticasone 50 MCG/ACT nasal spray Commonly known as: FLONASE Place 1 spray into both nostrils daily.   isosorbide mononitrate 30 MG 24 hr tablet Commonly known as: IMDUR Take 1 tablet (30 mg total) by mouth daily.   nicotine 14 mg/24hr patch Commonly known as: NICODERM CQ - dosed in mg/24 hours Place 1 patch (14 mg total) onto the skin daily.   phenylephrine 10 MG Tabs tablet Commonly known as: SUDAFED PE Take 10 mg by mouth every 6 (six) hours as needed (congestion).   sertraline 50 MG tablet Commonly known as: ZOLOFT Take 1 tablet (50 mg total) by mouth daily.   Trelegy Ellipta 100-62.5-25 MCG/INH Aepb Generic drug: Fluticasone-Umeclidin-Vilant Inhale 1 puff into the lungs daily.   triamcinolone 55 MCG/ACT Aero nasal inhaler Commonly known as: NASACORT Place 2 sprays into the nose daily as needed (allergies).       ASK your doctor about these medications    clopidogrel 75 MG tablet Commonly known as: PLAVIX TAKE 1 TABLET(75 MG) BY MOUTH DAILY   ipratropium-albuterol 0.5-2.5 (3) MG/3ML  Soln Commonly known as: DUONEB Take 3 mLs by nebulization every 6 (six) hours as needed.   predniSONE 20 MG tablet Commonly known as: DELTASONE Take 1 tablet (20 mg total) by mouth daily with breakfast for 2 days. Ask about: Should I take this medication?   rosuvastatin 20 MG tablet Commonly known as: CRESTOR TAKE 1 TABLET(20 MG) BY MOUTH DAILY       Allergies  Allergen Reactions   Amlodipine Cough   Aspirin Other (See Comments)    Sneezes, watery nose, wheeze (no polyps)    Contrast Media [Iodinated Diagnostic Agents]     Need to be pre-medicated, sneezing, watery eyes   Erythromycin Hives    Childhood allergy All mycin drugs    Losartan Potassium Cough   Penicillins     Childhood allergy Has patient had a PCN reaction causing immediate rash, facial/tongue/throat swelling, SOB or lightheadedness with hypotension: Unknown Has patient had a PCN reaction causing severe rash involving mucus membranes or skin necrosis: Unknown Has patient had a PCN reaction that required hospitalization: No Has patient had a PCN reaction occurring within the last 10 years: No States he has had benadryl when taking this and had no problems recently     Simvastatin  Other (See Comments)    Hip pain   Sulfa Antibiotics     unknown    Follow-up Information     [facility] Follow up.   Specialty: General Surgery Why: As needed for abdominal pain, nausea, vomitting Contact information: [address] [place] [address] [phone]         Brady Ruddy, MD. Schedule an appointment as soon as possible for a visit in 1 week(s).   Specialty: Family Medicine Contact information: [place] [address] [phone]         Lendon Colonel, NP Follow up on 12/17/2020.   Specialties: Nurse Practitioner, Radiology, Cardiology Why: Jory Sims, NP 8/19 @11 :15 am Contact information: [address] [place]  [address] [phone]                  The results of significant diagnostics from this hospitalization (including imaging, microbiology, ancillary and laboratory) are listed below for reference.    Significant Diagnostic Studies: CT ABDOMEN PELVIS WO CONTRAST  Result Date: 11/04/2020 CLINICAL DATA:  Nausea and vomiting. Left lower quadrant tenderness. Abdominal distension. EXAM: CT ABDOMEN AND PELVIS WITHOUT CONTRAST TECHNIQUE: Multidetector CT imaging of the abdomen and pelvis was performed following the standard protocol without IV contrast. COMPARISON:  Radiograph earlier today. FINDINGS: Lower chest: Subpleural densities in the lung bases favoring atelectasis. There is mucous plugging in the distal right lower lobe bronchials. No pleural fluid. Coronary artery calcifications. Hepatobiliary: Borderline hepatic steatosis. No evidence of focal lesion on this unenhanced exam. Clips in the gallbladder fossa postcholecystectomy. No biliary dilatation. Pancreas: Mild fatty atrophy of the head. No ductal dilatation or inflammation. Spleen: Normal in size without focal abnormality. Adrenals/Urinary Tract: Normal adrenal glands. Mild bilateral perinephric edema. 2.8 cm cyst arises from the posterolateral right kidney. No hydronephrosis or renal stone. No evidence of solid lesion. Partially distended urinary bladder without wall thickening. Stomach/Bowel: Stomach distended with enteric contrast. Proximal small bowel is dilated, contrast and fluid-filled. Transition point from dilated to nondilated in the central anterior abdomen, series 6, image 40. More distal small bowel is decompressed. The appendix is normal. Moderate colonic stool burden. Diverticulosis most prominently affecting the left colon, without diverticulitis. Vascular/Lymphatic: Advanced aortic and branch atherosclerosis. There is a stent in the right common iliac artery. No portal venous or mesenteric gas. No abdominopelvic adenopathy.  Reproductive: Prominent prostate gland spans 5 cm transverse. Other: No ascites or free air. No significant mesenteric edema. No focal fluid collection. No abdominal wall hernia. Musculoskeletal: Degenerative disc disease at L5-S1. There are no acute or suspicious osseous abnormalities. IMPRESSION: 1. Small bowel obstruction with transition point in the central anterior abdomen, likely secondary to adhesions. 2. Colonic diverticulosis without diverticulitis. 3. Advanced aortic and branch atherosclerosis. Aortic Atherosclerosis (ICD10-I70.0). Electronically Signed   By: Keith Rake M.D.   On: 11/04/2020 18:32   DG Abd 1 View  Result Date: 11/05/2020 CLINICAL DATA:  Small bowel obstruction, nausea and vomiting EXAM: ABDOMEN - 1 VIEW COMPARISON:  11/04/2020 FINDINGS: Interval increase in diffuse gas distention of the small bowel in the mid abdomen, loops measuring up to 6.7 cm. There is gas and stool present throughout the colon to the rectum. No free air in the abdomen on supine radiographs. IMPRESSION: 1. Interval increase in diffuse gas distention of the small bowel in the mid abdomen, loops measuring up to 6.7 cm. There is gas and stool present throughout the colon to the  rectum. 2.  No free air in the abdomen on supine radiographs. Electronically Signed   By: Eddie Candle M.D.   On: 11/05/2020 14:02   DG Abd 1 View  Result Date: 11/04/2020 CLINICAL DATA:  70 year old male with history of left lower quadrant abdominal pain. Evaluate for ileus versus constipation. EXAM: ABDOMEN - 1 VIEW COMPARISON:  No priors. FINDINGS: Multiple dilated loops of small bowel measuring up to 4.7 cm in diameter. Gaseous distension of the stomach. There is gas and stool present throughout the colon and distal rectum. No gross pneumoperitoneum noted on these supine images. Surgical clips project over the right upper quadrant of the abdomen, compatible with prior cholecystectomy. IMPRESSION: 1. Nonspecific bowel gas pattern, as  above. Findings may suggest an ileus in the appropriate clinical setting, however, the possibility of an early or partial small bowel obstruction is not excluded. Electronically Signed   By: Vinnie Langton M.D.   On: 11/04/2020 06:50   CT Angio Chest Pulmonary Embolism (PE) W or WO Contrast  Result Date: 11/01/2020 CLINICAL DATA:  Shortness of breath. EXAM: CT ANGIOGRAPHY CHEST WITH CONTRAST TECHNIQUE: Multidetector CT imaging of the chest was performed using the standard protocol during bolus administration of intravenous contrast. Multiplanar CT image reconstructions and MIPs were obtained to evaluate the vascular anatomy. CONTRAST:  45mL OMNIPAQUE IOHEXOL 350 MG/ML SOLN COMPARISON:  March 11, 2020. FINDINGS: Cardiovascular: Satisfactory opacification of the pulmonary arteries to the segmental level. No evidence of pulmonary embolism. Normal heart size. No pericardial effusion. Coronary artery calcifications are noted. Mediastinum/Nodes: No enlarged mediastinal, hilar, or axillary lymph nodes. Thyroid gland, trachea, and esophagus demonstrate no significant findings. Lungs/Pleura: No pneumothorax or pleural effusion is noted. Emphysematous disease is noted throughout both lungs. No acute pulmonary disease is noted. Upper Abdomen: No acute abnormality. Musculoskeletal: No chest wall abnormality. No acute or significant osseous findings. Review of the MIP images confirms the above findings. IMPRESSION: No definite evidence of pulmonary embolus. Coronary artery calcifications are noted suggesting coronary artery disease. Aortic Atherosclerosis (ICD10-I70.0) and Emphysema (ICD10-J43.9). Electronically Signed   By: Marijo Conception M.D.   On: 11/01/2020 19:57   DG Chest Portable 1 View  Result Date: 10/31/2020 CLINICAL DATA:  Chest pain and shortness of breath EXAM: PORTABLE CHEST 1 VIEW COMPARISON:  02/26/2020 FINDINGS: Heart size and pulmonary vascularity are normal. Bronchiectasis, peribronchial  thickening, and coarse interstitial infiltrates in both lungs, similar to prior study. These likely represent chronic bronchitis and fibrosis. Emphysematous changes in the upper lungs. No definite focal infiltrates. No pleural effusions. No pneumothorax. Mediastinal contours appear intact. IMPRESSION: Emphysematous changes, chronic bronchitic changes, and fibrosis in the lungs, similar to prior study. Electronically Signed   By: Lucienne Capers M.D.   On: 10/31/2020 03:03   DG Abd Portable 1V  Result Date: 11/06/2020 CLINICAL DATA:  RIGHT lower quadrant pain with nausea. EXAM: PORTABLE ABDOMEN - 1 VIEW COMPARISON:  Plain film of the abdomen dated 11/05/2020. FINDINGS: Persistently distended gas-filled loops of small bowel throughout the abdomen and upper pelvis. Air is seen within the relatively decompressed colon. Cholecystectomy clips in the RIGHT upper quadrant. No evidence of free intraperitoneal air. IMPRESSION: Continued evidence of small-bowel obstruction, not significantly changed compared to most recent chest x-ray of 11/05/2020 but increased small bowel distension compared to earlier plain film of 11/04/2020. Associated transition zone demonstrated in the central anterior abdomen on CT abdomen of 11/04/2020. Electronically Signed   By: Franki Cabot M.D.   On: 11/06/2020 10:46  DG Abd Portable 2V  Result Date: 11/08/2020 CLINICAL DATA:  Small bowel obstruction. EXAM: PORTABLE ABDOMEN - 2 VIEW COMPARISON:  11/06/2020. FINDINGS: Persistent but minimally improved small bowel dilatation. There is oral contrast and stool throughout the colon. IMPRESSION: Slight improvement in small bowel obstruction. Electronically Signed   By: Lorin Picket M.D.   On: 11/08/2020 08:27   VAS [place] ABI WITH/WO TBI  Result Date: 10/20/2020  LOWER EXTREMITY DOPPLER STUDY Patient Name:  BERT PTACEK SR.  Date of Exam:   10/20/2020 Medical Rec #: [id number]              Accession #:    [id number] Date of Birth:  28-Jul-1950              Patient Gender: M Patient Age:   12Y Exam Location:  [facility] Procedure:      VAS [place] ABI WITH/WO TBI Referring Phys: [id number] [place] --------------------------------------------------------------------------------  Indications: Claudication, and peripheral artery disease. High Risk Factors: Hypertension, hyperlipidemia, current smoker, coronary artery                    disease. Other Factors: Patient denies claudication however he states he has not been                walking or exerting himself due to shortness of breath and unable                to walk as much as he used to.  Vascular Interventions: Right common iliac artery stenting in 2001, followed by                         PTA/restenting in 11/15 due to occlusion; left common                         iliac artery PTA and stenting 10/30/16. Comparison Study: 09/2019-Right ABI 0.59; Left ABI 0.63 Performing Technologist: Wilkie Aye RVT  Examination Guidelines: A complete evaluation includes at minimum, Doppler waveform signals and systolic blood pressure reading at the level of bilateral brachial, anterior tibial, and posterior tibial arteries, when vessel segments are accessible. Bilateral testing is considered an integral part of a complete examination. Photoelectric Plethysmograph (PPG) waveforms and toe systolic pressure readings are included as required and additional duplex testing as needed. Limited examinations for reoccurring indications may be performed as noted.  ABI Findings: +---------+------------------+-----+----------+--------+ Right    Rt Pressure (mmHg)IndexWaveform  Comment  +---------+------------------+-----+----------+--------+ Brachial 132                                       +---------+------------------+-----+----------+--------+ ATA      72                0.55 monophasic         +---------+------------------+-----+----------+--------+ PTA      72                0.55 monophasic          +---------+------------------+-----+----------+--------+ PERO     70                0.53 monophasic         +---------+------------------+-----+----------+--------+ Great Toe52                0.39 Abnormal           +---------+------------------+-----+----------+--------+ +---------+------------------+-----+----------+-------+  Left     Lt Pressure (mmHg)IndexWaveform  Comment +---------+------------------+-----+----------+-------+ Brachial 129                                      +---------+------------------+-----+----------+-------+ ATA      74                0.56 monophasic        +---------+------------------+-----+----------+-------+ PTA      84                0.64 monophasic        +---------+------------------+-----+----------+-------+ PERO     82                0.62 monophasic        +---------+------------------+-----+----------+-------+ Great Toe50                0.38 Abnormal          +---------+------------------+-----+----------+-------+ +-------+-----------+-----------+------------+------------+ ABI/TBIToday's ABIToday's TBIPrevious ABIPrevious TBI +-------+-----------+-----------+------------+------------+ Right  0.55       0.39       0.59        0.32         +-------+-----------+-----------+------------+------------+ Left   0.64       0.38       0.63        0.44         +-------+-----------+-----------+------------+------------+ Bilateral TBIs and ABIs appear essentially unchanged compared to prior study on 09/2019.  Summary: Right: Resting right ankle-brachial index indicates moderate right lower extremity arterial disease. The right toe-brachial index is abnormal. Left: Resting left ankle-brachial index indicates moderate left lower extremity arterial disease. The left toe-brachial index is abnormal.  *See table(s) above for measurements and observations.  Suggest follow up study in 12 months. Electronically signed by Jenkins Rouge  MD on 10/20/2020 at 11:00:34 AM.    Final    ECHOCARDIOGRAM COMPLETE  Result Date: 11/02/2020    ECHOCARDIOGRAM REPORT   Patient Name:   BRAX WALEN Sr. Date of Exam: 11/02/2020 Medical Rec #:  [id number]             Height:       70.0 in Accession #:    [id number]            Weight:       194.0 lb Date of Birth:  [birth year]             BSA:          2.060 m Patient Age:    68 years              BP:           130/81 mmHg Patient Gender: M                     HR:           67 bpm. Exam Location:  Inpatient Procedure: 2D Echo, Cardiac Doppler and Color Doppler Indications:    Chest pain  History:        Patient has no prior history of Echocardiogram examinations.                 CAD, COPD; Risk Factors:Current Smoker and Hypertension.  Sonographer:    Cammy Brochure Referring Phys: [phone] HAO MENG  Sonographer Comments: Image acquisition challenging due to COPD and Image acquisition challenging due to patient body  habitus. IMPRESSIONS  1. Left ventricular ejection fraction, by estimation, is 40 to 45%. The left ventricle has mildly decreased function. The left ventricle demonstrates regional wall motion abnormalities (see scoring diagram/findings for description). The left ventricular  internal cavity size was moderately dilated. There is mild concentric left ventricular hypertrophy. Left ventricular diastolic parameters are consistent with Grade II diastolic dysfunction (pseudonormalization).  2. Right ventricular systolic function is normal. The right ventricular size is normal. Tricuspid regurgitation signal is inadequate for assessing PA pressure.  3. Left atrial size was moderately dilated.  4. The mitral valve is grossly normal. No evidence of mitral valve regurgitation. No evidence of mitral stenosis.  5. The aortic valve is calcified. There is mild calcification of the aortic valve. There is moderate thickening of the aortic valve. Aortic valve regurgitation is not visualized. Mild to moderate aortic  valve sclerosis/calcification is present, without any evidence of aortic stenosis.  6. The inferior vena cava is normal in size with greater than 50% respiratory variability, suggesting right atrial pressure of 3 mmHg. Comparison(s): No prior Echocardiogram. FINDINGS  Left Ventricle: Left ventricular ejection fraction, by estimation, is 40 to 45%. The left ventricle has mildly decreased function. The left ventricle demonstrates regional wall motion abnormalities. The left ventricular internal cavity size was moderately dilated. There is mild concentric left ventricular hypertrophy. Left ventricular diastolic parameters are consistent with Grade II diastolic dysfunction (pseudonormalization).  LV Wall Scoring: The antero-lateral wall and posterior wall are hypokinetic. Right Ventricle: The right ventricular size is normal. No increase in right ventricular wall thickness. Right ventricular systolic function is normal. Tricuspid regurgitation signal is inadequate for assessing PA pressure. Left Atrium: Left atrial size was moderately dilated. Right Atrium: Right atrial size was normal in size. Pericardium: There is no evidence of pericardial effusion. Mitral Valve: The mitral valve is grossly normal. There is moderate thickening of the mitral valve leaflet(s). Mild mitral annular calcification. No evidence of mitral valve regurgitation. No evidence of mitral valve stenosis. Tricuspid Valve: The tricuspid valve is normal in structure. Tricuspid valve regurgitation is not demonstrated. No evidence of tricuspid stenosis. Aortic Valve: The aortic valve is calcified. There is mild calcification of the aortic valve. There is moderate thickening of the aortic valve. Aortic valve regurgitation is not visualized. Mild to moderate aortic valve sclerosis/calcification is present, without any evidence of aortic stenosis. Aortic valve mean gradient measures 11.0 mmHg. Aortic valve peak gradient measures 21.3 mmHg. Aortic valve  area, by VTI measures 1.43 cm. Pulmonic Valve: The pulmonic valve was not well visualized. Pulmonic valve regurgitation is not visualized. No evidence of pulmonic stenosis. Aorta: The aortic root and ascending aorta are structurally normal, with no evidence of dilitation. Venous: The inferior vena cava is normal in size with greater than 50% respiratory variability, suggesting right atrial pressure of 3 mmHg. IAS/Shunts: The atrial septum is grossly normal.  LEFT VENTRICLE PLAX 2D LVIDd:         5.75 cm  Diastology LVIDs:         4.10 cm  LV e' medial:    6.64 cm/s LV PW:         1.20 cm  LV E/e' medial:  17.8 LV IVS:        1.10 cm  LV e' lateral:   10.30 cm/s LVOT diam:     2.00 cm  LV E/e' lateral: 11.5 LV SV:         63 LV SV Index:   30 LVOT Area:  3.14 cm  RIGHT VENTRICLE             IVC RV Basal diam:  3.50 cm     IVC diam: 1.70 cm RV S prime:     14.40 cm/s TAPSE (M-mode): 2.5 cm LEFT ATRIUM             Index       RIGHT ATRIUM           Index LA diam:        4.70 cm 2.28 cm/m  RA Area:     12.00 cm LA Vol (A2C):   75.6 ml 36.69 ml/m RA Volume:   26.50 ml  12.86 ml/m LA Vol (A4C):   94.6 ml 45.91 ml/m LA Biplane Vol: 85.5 ml 41.50 ml/m  AORTIC VALVE AV Area (Vmax):    1.37 cm AV Area (Vmean):   1.39 cm AV Area (VTI):     1.43 cm AV Vmax:           231.00 cm/s AV Vmean:          155.000 cm/s AV VTI:            0.436 m AV Peak Grad:      21.3 mmHg AV Mean Grad:      11.0 mmHg LVOT Vmax:         101.00 cm/s LVOT Vmean:        68.700 cm/s LVOT VTI:          0.199 m LVOT/AV VTI ratio: 0.46  AORTA Ao Root diam: 3.30 cm Ao Asc diam:  2.70 cm MITRAL VALVE MV Area (PHT): 6.71 cm     SHUNTS MV Decel Time: 113 msec     Systemic VTI:  0.20 m MV E velocity: 118.00 cm/s  Systemic Diam: 2.00 cm MV A velocity: 118.00 cm/s MV E/A ratio:  1.00 Rudean Haskell MD Electronically signed by Rudean Haskell MD Signature Date/Time: 11/02/2020/3:11:02 PM    Final    VAS US AORTA/IVC/ILIACS  Result Date:  10/20/2020 ABDOMINAL AORTA STUDY Patient Name:  Brady Brooks Sr.  Date of Exam:   10/20/2020 Medical Rec #: [id number]              Accession #:    [id number] Date of Birth: Oct 05, 1950              Patient Gender: M Patient Age:   69Y Exam Location:  [facility] Procedure:      VAS US AORTA/IVC/ILIACS Referring Phys: Sharpes --------------------------------------------------------------------------------  Risk         Hypertension, hyperlipidemia, current smoker, coronary artery Factors:     disease. Other Factors: Patient denies claudication however he states he has not been                walking or exerting himself due to shortness of breath and unable                to walk as much as he used to. Vascular Interventions: Right common iliac artery stenting in 2001, followed by                         PTA/restenting in 11/15 due to occlusion; left common                         iliac artery PTA and stenting 10/30/16. Limitations: Air/bowel gas and obesity.  Comparison Study:  09/2019-50-99% stenosis in the bilateral common iliac stents. Performing Technologist: Wilkie Aye RVT  Examination Guidelines: A complete evaluation includes B-mode imaging, spectral Doppler, color Doppler, and power Doppler as needed of all accessible portions of each vessel. Bilateral testing is considered an integral part of a complete examination. Limited examinations for reoccurring indications may be performed as noted.  Abdominal Aorta Findings: +-------------+----------+----------+--------+--------+ Location     PSV (cm/s)Waveform  ThrombusComments +-------------+----------+----------+--------+--------+ Proximal     65                                   +-------------+----------+----------+--------+--------+ Mid          66                                   +-------------+----------+----------+--------+--------+ Distal       45                                    +-------------+----------+----------+--------+--------+ RT CIA Prox  232       biphasic                   +-------------+----------+----------+--------+--------+ RT CIA Mid   277       biphasic                   +-------------+----------+----------+--------+--------+ RT CIA Distal361       biphasic                   +-------------+----------+----------+--------+--------+ RT EIA Prox  246       biphasic                   +-------------+----------+----------+--------+--------+ RT EIA Mid   98        monophasic                 +-------------+----------+----------+--------+--------+ RT EIA Distal80        monophasic                 +-------------+----------+----------+--------+--------+ LT CIA Prox  154       biphasic                   +-------------+----------+----------+--------+--------+ LT CIA Mid   146       biphasic                   +-------------+----------+----------+--------+--------+ LT CIA Distal150       biphasic                   +-------------+----------+----------+--------+--------+ LT EIA Prox  177       biphasic                   +-------------+----------+----------+--------+--------+ LT EIA Mid   114       biphasic                   +-------------+----------+----------+--------+--------+ LT EIA [id number]       biphasic                   +-------------+----------+----------+--------+--------+ Visualization of the Proximal Abdominal Aorta, Mid Abdominal Aorta, Right CIA Proximal artery, Right EIA Proximal artery, Left CIA Proximal artery, Left EIA Proximal artery and Left CIA Distal  artery was limited. Unable to visualize stent struts.  Summary: Stenosis: +--------------------+-------------+---------------+ Location            Stenosis     Stent           +--------------------+-------------+---------------+ Right Common Iliac               50-99% stenosis +--------------------+-------------+---------------+ Left Common  Iliac                1-49% stenosis  +--------------------+-------------+---------------+ Right External Iliac>50% stenosis                +--------------------+-------------+---------------+ Left External Iliac <50% stenosis                +--------------------+-------------+---------------+ Compared to prior exam in 09/2019, there is no significant change.  *See table(s) above for measurements and observations. Suggest follow up study in 12 months.  Electronically signed by Jenkins Rouge MD on 10/20/2020 at 11:00:57 AM.    Final     Microbiology: No results found for this or any previous visit (from the past 240 hour(s)).   Labs: Basic Metabolic Panel: No results for input(s): NA, K, CL, CO2, GLUCOSE, BUN, CREATININE, CALCIUM, MG, PHOS in the last 168 hours. Liver Function Tests: No results for input(s): AST, ALT, ALKPHOS, BILITOT, PROT, ALBUMIN in the last 168 hours. No results for input(s): LIPASE, AMYLASE in the last 168 hours. No results for input(s): AMMONIA in the last 168 hours. CBC: No results for input(s): WBC, NEUTROABS, HGB, HCT, MCV, PLT in the last 168 hours. Cardiac Enzymes: No results for input(s): CKTOTAL, CKMB, CKMBINDEX, TROPONINI in the last 168 hours. BNP: BNP (last 3 results) No results for input(s): BNP in the last 8760 hours.  ProBNP (last 3 results) No results for input(s): PROBNP in the last 8760 hours.  CBG: No results for input(s): GLUCAP in the last 168 hours.     Signed:  Domenic Polite MD.  [facility] 11/17/2020, 4:59 PM

## 2020-11-18 DIAGNOSIS — Z86010 Personal history of colonic polyps: Secondary | ICD-10-CM | POA: Diagnosis not present

## 2020-11-18 DIAGNOSIS — R112 Nausea with vomiting, unspecified: Secondary | ICD-10-CM | POA: Diagnosis not present

## 2020-11-28 DIAGNOSIS — Z23 Encounter for immunization: Secondary | ICD-10-CM | POA: Diagnosis not present

## 2020-12-16 NOTE — Progress Notes (Signed)
Cardiology Office Note   Date:  12/17/2020   ID:  Brady Crumb Sr., DOB July 30, 1950, MRN [id number]  PCP:  Billie Ruddy, MD  Cardiologist:  Dr.Berry  CC:    History of Present Illness: Brady HISLE Sr. is a 69 y.o. male who presents for ongoing assessment and management of CAD status post RCA PCI and occluded left circumflex in 2005, PAD, COPD, hypertension, and ongoing tobacco abuse.  He was seen by cardiology on consultation during recent hospitalization in July 2022 after presenting to the ED with progressive shortness of breath and chest pain.  He was diagnosed with acute COPD exacerbation.  Echocardiogram on 11/02/2020 revealed an EF of 40% to 45% with anterior lateral and inferior wall hypokinesis with grade 2 diastolic dysfunction, and mild LVH.  A Myoview was not completed due to acute COPD flare and is to be plan for ischemic evaluation as an outpatient.  He was continued on Plavix 75 mg daily and Crestor 20 mg daily in the setting of CAD and PAD.  It is noted that he is allergic to aspirin.  During hospitalization he was diagnosed with atrial fibrillation and started on apixaban and diltiazem and discharged on apixaban 5 mg twice daily.  Diltiazem was placed with caution at 120 mg long-acting daily as he was unable to tolerate beta-blocker due to active COPD flare.  If he has had resolution of COPD flare diltiazem will be changed to beta-blocker today.  A Zio monitor was placed to evaluate for frequency of atrial fibrillation.  Outpatient cardiac monitor was read by Dr. Gwenlyn Found revealing sinus rhythm, sinus bradycardia and sinus tachycardia with frequent PVCs and occasional PACs.  Episodes of junctional rhythm, short runs of nonsustained ventricular tachycardia and SVT.  He had a minimum heart rate of 40 bpm, maximum heart rate of 218 bpm with an average heart rate of 66 bpm.  Since discharge, the patient has stopped taking Eliquis.  He was discharged on 11/09/2020, begin to take  the Eliquis at home but after 48 hours he began to have significant nausea vomiting and stomach pain.  And therefore he stopped it on his own.  He has run out of his diltiazem and isosorbide over the last 4 days prior to coming in.  He continues to have issues with his breathing although he states it is better, he did appear dyspneic while speaking with me.   Past Medical History:  Diagnosis Date   COPD (chronic obstructive pulmonary disease) ([place])    Coronary artery disease    a. s/p RCA stenting back in 2005 with a known chronically occluded circumflex and normal LV function.   Emphysema lung ([facility])    GERD (gastroesophageal reflux disease)    Heart attack ([place]) <2005   History of tobacco abuse    Hyperlipidemia    Hypertension    Lung nodule    a. Suspicious for malignancy, undergoing workup.   Obesity    Peripheral arterial disease ([place])    a. history of stent to right common iliac artery 12/15/99 with a peak for balloon-expandable stent. b. s/p intervention on RCIA total occlusion PTA and stent, residual disease on the right for possible staged intervention, notable disease on the left but asymptomatic in 2015. c. 10/2016: s/p PTA and stenting of left common iliac    Past Surgical History:  Procedure Laterality Date   CARDIAC CATHETERIZATION  10/26/1999   [facility] Endo 09/13/2010   Cardiolite study     59% ejection fraction and  negative for ischemia   CORONARY ANGIOPLASTY WITH STENT PLACEMENT  2005   Taxus stent placed to his RCA    [place] Right 12/15/1999   a. history of stent to right common iliac artery 12/15/99 with a peak for balloon-expandable stent. b. s/p intervention on RCIA total occlusion PTA and stent, residual disease on the right for possible staged intervention, notable disease on the left but asymptomatic.   ILIAC VEIN ANGIOPLASTY / STENTING Right 03/16/2014   rt common iliac    by dr berry   LAPAROSCOPIC CHOLECYSTECTOMY  1990s   LOWER  EXTREMITY ANGIOGRAM N/A 03/16/2014   Procedure: LOWER EXTREMITY ANGIOGRAM;  Surgeon: [name], MD;  Location: [facility] CATH LAB;  Service: Cardiovascular;  Laterality: N/A;   LOWER EXTREMITY ANGIOGRAPHY N/A 10/30/2016   Procedure: Lower Extremity Angiography;  Surgeon: [name], MD;  Location: [facility] CV LAB;  Service: Cardiovascular;  Laterality: N/A;   LOWER EXTREMITY ANGIOGRAPHY N/A 11/30/2016   Procedure: Lower Extremity Angiography;  Surgeon: [name], MD;  Location: [facility] CV LAB;  Service: Cardiovascular;  Laterality: N/A;   PERIPHERAL VASCULAR INTERVENTION Left 10/30/2016   Procedure: Peripheral Vascular Intervention;  Surgeon: [name], MD;  Location: [facility] CV LAB;  Service: Cardiovascular;  Laterality: Left;  Lt Com ILIAC   TONSILLECTOMY AND ADENOIDECTOMY  [id number]   UMBILICAL HERNIA REPAIR  2012     Current Outpatient Medications  Medication Sig Dispense Refill   albuterol (VENTOLIN HFA) 108 (90 Base) MCG/ACT inhaler Inhale 2 puffs into the lungs every 6 (six) hours as needed for wheezing or shortness of breath. 18 g 12   clopidogrel (PLAVIX) 75 MG tablet TAKE 1 TABLET(75 MG) BY MOUTH DAILY (Patient taking differently: Take 75 mg by mouth daily.) 90 tablet 3   Coenzyme Q10 (COQ10) 100 MG CAPS Take 100 mg by mouth daily.     fexofenadine (ALLEGRA) 180 MG tablet Take 180 mg by mouth daily.      fluticasone (FLONASE) 50 MCG/ACT nasal spray Place 1 spray into both nostrils daily. 48 g 4   Fluticasone-Umeclidin-Vilant (TRELEGY ELLIPTA) 100-62.5-25 MCG/INH AEPB Inhale 1 puff into the lungs daily. 60 each 12   ipratropium-albuterol (DUONEB) 0.5-2.5 (3) MG/3ML SOLN Take 3 mLs by nebulization every 6 (six) hours as needed. (Patient taking differently: Take 3 mLs by nebulization every 6 (six) hours as needed (shortness of breath).) 75 mL prn   nicotine (NICODERM CQ - DOSED IN MG/24 HOURS) 14 mg/24hr patch Place 1 patch (14 mg total) onto the skin daily. 30  patch 0   phenylephrine (SUDAFED PE) 10 MG TABS tablet Take 10 mg by mouth every 6 (six) hours as needed (congestion).     rivaroxaban (XARELTO) 20 MG TABS tablet Take 1 tablet (20 mg total) by mouth daily with supper. 32 tablet 0   sertraline (ZOLOFT) 50 MG tablet Take 1 tablet (50 mg total) by mouth daily. 30 tablet 0   spironolactone (ALDACTONE) 25 MG tablet Take 1 tablet (25 mg total) by mouth daily. 90 tablet 3   triamcinolone (NASACORT) 55 MCG/ACT AERO nasal inhaler Place 2 sprays into the nose daily as needed (allergies).     rosuvastatin (CRESTOR) 20 MG tablet Take 1 tablet (20 mg total) by mouth daily. TAKE 1 TABLET(20 MG) BY MOUTH DAILY 30 tablet 10   No current facility-administered medications for this visit.    Allergies:   Amlodipine, Aspirin, Contrast media [iodinated diagnostic  agents], Erythromycin, Losartan potassium, Penicillins, Simvastatin, and Sulfa antibiotics    Social History:  The patient  reports that he has been smoking cigarettes. He has a 100.00 pack-year smoking history. He has never used smokeless tobacco. He reports current alcohol use. He reports that he does not use drugs.   Family History:  The patient's family history includes Asthma in his son; CAD in his brother and brother; Heart disease in his father; Lung cancer in his paternal grandfather.    ROS: All other systems are reviewed and negative. Unless otherwise mentioned in H&P    PHYSICAL EXAM: VS:  BP (!) 142/62   Pulse (!) 59   Ht '5\' 10"'$  (1.778 m)   Wt 196 lb (88.9 kg)   SpO2 96%   BMI 28.12 kg/m  , BMI Body mass index is 28.12 kg/m. GEN: Well nourished, well developed, in no acute distress HEENT: normal Neck: no JVD, carotid bruits, or masses Cardiac: RRR; bradycardic, 1/6 systolic murmur murmurs, no rubs, or gallops,no edema  Respiratory:  Clear to auscultation bilaterally, mildly dyspneic through his facemask, O2 sat was 96%.  Diminished breath sounds in the bases GI: soft, nontender,  nondistended, + BS MS: no deformity or atrophy Skin: warm and dry, no rash Neuro:  Strength and sensation are intact Psych: euthymic mood, full affect   EKG:  EKG is ordered today. The ekg ordered today demonstrates (personally reviewed) sinus bradycardia heart rate of 57 bpm, inferior Q waves are noted.   Recent Labs: 10/31/2020: Magnesium 2.0 11/09/2020: BUN 13; Creatinine, Ser 0.70; Hemoglobin 15.9; Platelets 108; Potassium 4.1; Sodium 133    Lipid Panel    Component Value Date/Time   CHOL 130 11/17/2019 1403   TRIG 65 11/17/2019 1403   HDL 55 11/17/2019 1403   CHOLHDL 2.4 11/17/2019 1403   CHOLHDL 5.5 (H) 09/12/2016 0821   VLDL 46 (H) 09/12/2016 0821   LDLCALC 61 11/17/2019 1403      Wt Readings from Last 3 Encounters:  12/17/20 196 lb (88.9 kg)  10/31/20 194 lb (88 kg)  08/06/20 194 lb 12.8 oz (88.4 kg)      Other studies Reviewed: Zio Monitor Patient had a min HR of 40 bpm, max HR of 218 bpm, and avg HR of 66 bpm. Predominant underlying rhythm was Sinus Rhythm. First Degree AV Block was present. 3 Ventricular Tachycardia runs occurred, the run with the fastest interval lasting 7 beats with a max rate of 152 bpm (avg 130 bpm); the run with the fastest interval was also the longest. 80 Supraventricular Tachycardia runs occurred, the run with the fastest interval lasting 4 beats with a max rate of 218 bpm, the longest lasting 19 beats with an avg rate of 140 bpm. Junctional Rhythm was present. Idioventricular Rhythm was present. Isolated SVEs were rare (<1.0%), SVE Couplets were rare (<1.0%), and SVE Triplets were rare (<1.0%). Isolated VEs were rare (<1.0%, 9187), VE Couplets were rare (<1.0%, 80), and VE Triplets were rare (<1.0%, 10). Ventricular Bigeminy and Trigeminy were present.   1: Sinus rhythm/sinus bradycardia/sinus tachycardia 2: Frequent PVCs, occasional PACs 3: Episodes of junctional rhythm 4: Short runs of nonsustained ventricular tachycardia and  SVT  ECHO: 11/02/2020  1. Left ventricular ejection fraction, by estimation, is 40 to 45%. The  left ventricle has mildly decreased function. The left ventricle  demonstrates regional wall motion abnormalities (see scoring  diagram/findings for description). The left ventricular   internal cavity size was moderately dilated. There is mild concentric  left ventricular hypertrophy. Left ventricular diastolic parameters are  consistent with Grade II diastolic dysfunction (pseudonormalization).   2. Right ventricular systolic function is normal. The right ventricular  size is normal. Tricuspid regurgitation signal is inadequate for assessing PA pressure.   3. Left atrial size was moderately dilated. R atrium normal in size  4. The mitral valve is grossly normal. No evidence of mitral valve  regurgitation. No evidence of mitral stenosis.   5. The aortic valve is calcified. There is mild calcification of the  aortic valve. There is moderate thickening of the aortic valve. Aortic  valve regurgitation is not visualized. Mild to moderate aortic valve  sclerosis/calcification is present, without  any evidence of aortic stenosis.   6. The inferior vena cava is normal in size with greater than 50%  respiratory variability, suggesting right atrial pressure of 3 mmHg.   Cath 10/15/2003 CORONARY ARTERIOGRAPHY:  Calcification was noted in the proximal LAD on  fluoroscopy.    1. Left main normal.  2. LAD:  The left anterior descending crossed the apex of the heart and had mild irregularities in the mid portion.  The first diagonal also had mild irregularities.  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  3. Circumflex:  The circumflex had moderate irregularities in the mid     portion just before it became 100% occluded.  There were two very small OM vessels that came off the proximal portion.  What appears to be a relatively large  third OM was totally occluded, but filled retrograde and late via left-to-left collaterals (this is unchanged from 2001 cath).  4. Right coronary artery:  The right coronary artery is a dominant vessel giving rise to PDA and posterior lateral branch.  The proximal portion of the right coronary artery had an eccentric, but focal area of 90% narrowing.  The mid and distal vessel were free of disease.    CONCLUSION:  1. 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  2. Mild irregularities in the left anterior descending.  3. High grade stenosis in the proximal right coronary artery.  4. Normal left ventricular function.  5. Apparent restenosis within the right iliac stent.    Because of the high grade stenosis in the right coronary artery,  arrangements were made for intervention.  The 5 French sheath was exchanged out for 6 [place] sheath.  JR-4 guide catheter with side holes was used.  A short Luge wire was placed down the distal right coronary artery and a 2.5 x 9 Maverick balloon was used for dilatation at 8 atmospheres x 45.  Two inflations were performed.  Following this, a 3.0 x 16 Taxus stent was placed in such a manner that both the proximal and distal portion was well covered.  It was deployed initially at 13 atmospheres for 53 seconds.  The  final inflation at 15 atmospheres for 55 seconds.    The 90% area of the proximal right coronary artery before intervention is now less than 10% narrowed.  There was no evidence of any dissection or thrombus or distal embolization.  There was TIMI-3 flow pre and post intervention.    The patient was maintained on heparin with therapeutic ACTs with final ACT at the end of the procedure being 233.  Integrilin infusion was also performed and will be carried out for additional 12 hours.    The patient was  given 300 mg of oral Plavix and he should be ready for discharge in the morning.  His LDL is 70 and the only  untreated risk factor is his continued cigarette abuse which I discussed in detail with the patient.  ASSESSMENT AND PLAN:  1.  Paroxysmal atrial fibrillation: Likely due to COPD exacerbation.  Ziehl monitor did not reveal any evidence of atrial fibrillation.  The patient stopped taking Eliquis due to severe nausea and vomiting.  He is no longer on rate control medicines as well which had been diltiazem, but he ran out about 5 days ago.  Due to my concerns about bradycardia off of AV nodal blocking agents, I discussed this with DOD Dr. Davina Poke concerning the need to restart any kind of rate control medication in this setting.  His recommendation was to leave him off the rate control medications for now and therefore diltiazem was not restarted nor was metoprolol.  Concerning anticoagulation he does have a high CHA2DS2-VASc score and will need to be on this.  I am going to start him on Xarelto 20 mg daily.  Samples are given to him to evaluate his tolerance of this medication.  He is to call us concerning any GI side effects with this medication.  I am going to see him in 2 weeks on close follow-up.  2.  CAD: Plans were to set him up for cardiac catheterization as an outpatient on follow-up.  As he is now off of anticoagulation, and newly starting on this I would like to see if he will stay on it at least 1 month before we plan cardiac catheterization.  He states he is able to mow his lawn and do activities without any discomfort in his chest although he does have to stop and rest due to breathing issues.  3.  Hypertension: Slightly elevated today.  I will restart spironolactone as he had been on that in the past prior to recent hospitalization.  It was discontinued after he was placed on diltiazem.  We will see how his blood pressure responds in a few weeks.  4.  Systolic CHF: Most recent echocardiogram revealing EF of 40 to 45%.  Will likely need to have cardiac catheterization to evaluate for  ischemic causes of reduced EF.  We will discuss this further on next office visit, hopefully Dr. Gwenlyn Found will be onsite so that I can discuss this further with him.  He will continue on spironolactone, consider adding ARB if blood pressures not well controlled.  Review of labs from hospitalization 1 month ago creatinine 0.70 potassium 4.1.  5.  COPD: He continues to have chronic dyspnea his O2 sat was 96%.  He is followed by Dr. Annamaria Boots with [facility].  He denies significant shortness of breath and is able to mow his lawn and do daily activities although sometimes he gets tired and has to stop.  He denies any PND orthopnea.  6.  Peripheral arterial disease: Continue Plavix as directed.  He denies any claudication symptoms.  7.  Hyperlipidemia: Continue rosuvastatin 20 mg daily.  Goal of LDL less than 70.  Most recent labs on 11/14/2016 had an LDL of 63.  We will likely need to repeat this unless this is been completed by PCP.  8.  Tobacco abuse: Has stopped smoking since discharge from the hospital 1 month ago.   Current medicines are reviewed at length with the patient today.  I have spent 45 mins dedicated to the care of this patient  on the date of this encounter to include pre-visit review of records, assessment, management and diagnostic testing,with shared decision making.  I also discussed case with Dr. Davina Poke who is DOD onsite.  Labs/ tests ordered today include: None  Phill Myron. West Pugh, ANP, AACC   12/17/2020 12:08 PM    [facility] [place] [address] Office [phone] Fax [phone]  Notice: This dictation was prepared with Dragon dictation along with smaller phrase technology. Any transcriptional errors that result from this process are unintentional and may not be corrected upon review.

## 2020-12-17 ENCOUNTER — Other Ambulatory Visit: Payer: Self-pay

## 2020-12-17 ENCOUNTER — Encounter: Payer: Self-pay | Admitting: Adult Health

## 2020-12-17 ENCOUNTER — Ambulatory Visit (INDEPENDENT_AMBULATORY_CARE_PROVIDER_SITE_OTHER): Payer: Medicare Other | Admitting: Family Medicine

## 2020-12-17 ENCOUNTER — Encounter: Payer: Self-pay | Admitting: Family Medicine

## 2020-12-17 ENCOUNTER — Ambulatory Visit (INDEPENDENT_AMBULATORY_CARE_PROVIDER_SITE_OTHER): Payer: Medicare Other | Admitting: Adult Health

## 2020-12-17 VITALS — BP 142/70 | HR 70 | Temp 98.4°F | Wt 197.4 lb

## 2020-12-17 VITALS — BP 142/62 | HR 59 | Ht 70.0 in | Wt 196.0 lb

## 2020-12-17 DIAGNOSIS — I2510 Atherosclerotic heart disease of native coronary artery without angina pectoris: Secondary | ICD-10-CM

## 2020-12-17 DIAGNOSIS — I739 Peripheral vascular disease, unspecified: Secondary | ICD-10-CM | POA: Diagnosis not present

## 2020-12-17 DIAGNOSIS — I358 Other nonrheumatic aortic valve disorders: Secondary | ICD-10-CM

## 2020-12-17 DIAGNOSIS — E782 Mixed hyperlipidemia: Secondary | ICD-10-CM

## 2020-12-17 DIAGNOSIS — F320 Major depressive disorder, single episode, mild: Secondary | ICD-10-CM | POA: Diagnosis not present

## 2020-12-17 DIAGNOSIS — J449 Chronic obstructive pulmonary disease, unspecified: Secondary | ICD-10-CM

## 2020-12-17 DIAGNOSIS — I10 Essential (primary) hypertension: Secondary | ICD-10-CM

## 2020-12-17 DIAGNOSIS — Z87891 Personal history of nicotine dependence: Secondary | ICD-10-CM | POA: Diagnosis not present

## 2020-12-17 DIAGNOSIS — K56600 Partial intestinal obstruction, unspecified as to cause: Secondary | ICD-10-CM | POA: Diagnosis not present

## 2020-12-17 DIAGNOSIS — I480 Paroxysmal atrial fibrillation: Secondary | ICD-10-CM

## 2020-12-17 MED ORDER — SPIRONOLACTONE 25 MG PO TABS: 25.0000 mg | ORAL_TABLET | Freq: Every day | ORAL | 3 refills | Status: DC

## 2020-12-17 MED ORDER — SERTRALINE HCL 50 MG PO TABS: 50.0000 mg | ORAL_TABLET | Freq: Every day | ORAL | 1 refills | Status: DC

## 2020-12-17 MED ORDER — RIVAROXABAN 20 MG PO TABS: 20.0000 mg | ORAL_TABLET | Freq: Every day | ORAL | 0 refills | Status: DC

## 2020-12-17 MED ORDER — ROSUVASTATIN CALCIUM 20 MG PO TABS: 20.0000 mg | ORAL_TABLET | Freq: Every day | ORAL | 10 refills | Status: DC

## 2020-12-17 MED ORDER — DILTIAZEM HCL ER COATED BEADS 120 MG PO CP24: 120.0000 mg | ORAL_CAPSULE | Freq: Every day | ORAL | 10 refills | Status: DC

## 2020-12-17 MED ORDER — ISOSORBIDE MONONITRATE ER 30 MG PO TB24: 30.0000 mg | ORAL_TABLET | Freq: Every day | ORAL | 10 refills | Status: DC

## 2020-12-17 NOTE — Patient Instructions (Signed)
Happy Early Birhday!!!  Congratulations on your efforts to quit smoking.  Continue the good work.  We will have you follow up in the next few months to make sure you are still doing ok.

## 2020-12-17 NOTE — Progress Notes (Signed)
Subjective:    Patient ID: Brady Crumb Sr., male    DOB: Aug 18, 1950, 70 y.o.   MRN: [id number]  Chief Complaint  Patient presents with   [facility] stay in July -COPD, small bowel obstruction.    HPI Patient is a 70 yo male with pmh sig for COPD, CAD, HTN, HLD, GERD, h/o tobacco use, PAD who was lost to f/u.  Pt seen today to [facility] and for for HFU.  Pt admitted 7/3-7/12/22 for COPD exacerbation and pSBO.  Since d/c, pt states he is doing well.  He quit smoking cigarettes.  Pt smoked for the last 6 yrs. Pt requests refill on Zoloft, started in hospital.  States is doing well.  Denies abdominal pain.  Past Medical History:  Diagnosis Date   COPD (chronic obstructive pulmonary disease) ([place])    Coronary artery disease    a. s/p RCA stenting back in 2005 with a known chronically occluded circumflex and normal LV function.   Emphysema lung (HCC)    GERD (gastroesophageal reflux disease)    Heart attack ([place]) <2005   History of tobacco abuse    Hyperlipidemia    Hypertension    Lung nodule    a. Suspicious for malignancy, undergoing workup.   Obesity    Peripheral arterial disease ([place])    a. history of stent to right common iliac artery 12/15/99 with a peak for balloon-expandable stent. b. s/p intervention on RCIA total occlusion PTA and stent, residual disease on the right for possible staged intervention, notable disease on the left but asymptomatic in 2015. c. 10/2016: s/p PTA and stenting of left common iliac    Allergies  Allergen Reactions   Amlodipine Cough   Aspirin Other (See Comments)    Sneezes, watery nose, wheeze (no polyps)    Contrast Media [Iodinated Diagnostic Agents]     Need to be pre-medicated, sneezing, watery eyes   Erythromycin Hives    Childhood allergy All mycin drugs    Losartan Potassium Cough   Penicillins     Childhood allergy Has patient had a PCN reaction causing immediate rash, facial/tongue/throat swelling, SOB or  lightheadedness with hypotension: Unknown Has patient had a PCN reaction causing severe rash involving mucus membranes or skin necrosis: Unknown Has patient had a PCN reaction that required hospitalization: No Has patient had a PCN reaction occurring within the last 10 years: No States he has had benadryl when taking this and had no problems recently     Simvastatin Other (See Comments)    Hip pain   Sulfa Antibiotics     unknown    ROS General: Denies fever, chills, night sweats, changes in weight, changes in appetite HEENT: Denies headaches, ear pain, changes in vision, rhinorrhea, sore throat CV: Denies CP, palpitations, SOB, orthopnea Pulm: Denies SOB, cough, wheezing GI: Denies abdominal pain, nausea, vomiting, diarrhea, constipation GU: Denies dysuria, hematuria, frequency Msk: Denies muscle cramps, joint pains Neuro: Denies weakness, numbness, tingling Skin: Denies rashes, bruising Psych: Denies depression, anxiety, hallucinations     Objective:    Blood pressure (!) 142/70, pulse 70, temperature 98.4 F (36.9 C), temperature source Oral, weight 197 lb 6.4 oz (89.5 kg), SpO2 92 %.  Gen. Pleasant, well-nourished, in no distress, normal affect   HEENT: [place]/AT, face symmetric, conjunctiva clear, no scleral icterus, PERRLA, EOMI, nares patent without drainage, pharynx without erythema or exudate. Neck: No JVD, no thyromegaly, no carotid bruits Lungs: no accessory muscle use, CTAB, no wheezes or  rales Cardiovascular: RRR, no m/r/g, no peripheral edema Abdomen: BS present, soft, NT/ND, no hepatosplenomegaly. Musculoskeletal: No deformities, no cyanosis or clubbing, normal tone Neuro:  A&Ox3, CN II-XII intact, normal gait Skin:  Warm, no lesions/ rash   Wt Readings from Last 3 Encounters:  12/17/20 197 lb 6.4 oz (89.5 kg)  12/17/20 196 lb (88.9 kg)  10/31/20 194 lb (88 kg)    Lab Results  Component Value Date   WBC 11.6 (H) 11/09/2020   HGB 15.9 11/09/2020   HCT 44.9  11/09/2020   PLT 108 (L) 11/09/2020   GLUCOSE 124 (H) 11/09/2020   CHOL 130 11/17/2019   TRIG 65 11/17/2019   HDL 55 11/17/2019   LDLCALC 61 11/17/2019   ALT 30 11/17/2019   AST 38 11/17/2019   NA 133 (L) 11/09/2020   K 4.1 11/09/2020   CL 101 11/09/2020   CREATININE 0.70 11/09/2020   BUN 13 11/09/2020   CO2 27 11/09/2020   TSH 0.87 09/12/2016   PSA 2.2 09/12/2016   INR 1.0 11/21/2016   PHQ9 SCORE ONLY 12/17/2020 12/26/2016  PHQ-9 Total Score 1 0   GAD 7 : Generalized Anxiety Score 12/17/2020  Nervous, Anxious, on Edge 1  Control/stop worrying 0  Worry too much - different things 0  Trouble relaxing 0  Restless 0  Easily annoyed or irritable 1  Afraid - awful might happen 0  Total GAD 7 Score 2  Anxiety Difficulty Not difficult at all      Assessment/Plan:  Former smoker -Continued smoking cessation encouraged -We will continue to monitor  COPD mixed type ([place]) -Continue follow-up with pulmonology -Continue current inhalers including albuterol, Trelegy -Continue smoking cessation  Partial small bowel obstruction (HCC) -Resolved -Discussed S/S of recurrence -Given precautions  Depression, major, single episode, mild (HCC) -Improving -PHQ-9 score 1.  GAD-7 score 2. -Consider counseling - Plan: sertraline (ZOLOFT) 50 MG tablet  Essential hypertension -elevated -lifestyle modifications encouraged -continue current meds -continue f/u with Cardiology  F/u as needed in the next few months  Grier Mitts, MD

## 2020-12-17 NOTE — Patient Instructions (Signed)
Medication Instructions:  Start Xarelto 20 mg (1 Tablet Daily After Super). Start Spironolactone 25 mg (1 Tablet Daily). Stop Imdur. Stop Cardizem. *If you need a refill on your cardiac medications before your next appointment, please call your pharmacy*   Lab Work: No Labs If you have labs (blood work) drawn today and your tests are completely normal, you will receive your results only by: [place] (if you have MyChart) OR A paper copy in the mail If you have any lab test that is abnormal or we need to change your treatment, we will call you to review the results.   Testing/Procedures: No Testing    Follow-Up: At [facility], you and your health needs are our priority.  As part of our continuing mission to provide you with exceptional heart care, we have created designated Provider Care Teams.  These Care Teams include your primary Cardiologist (physician) and Advanced Practice Providers (APPs -  Physician Assistants and Nurse Practitioners) who all work together to provide you with the care you need, when you need it.  Your next appointment:   2 week(s)  The format for your next appointment:   In Person  Provider:   Jory Sims, NP

## 2020-12-23 ENCOUNTER — Encounter: Payer: Self-pay | Admitting: Family Medicine

## 2020-12-31 ENCOUNTER — Encounter: Payer: Self-pay | Admitting: Cardiovascular Disease

## 2020-12-31 ENCOUNTER — Other Ambulatory Visit: Payer: Self-pay

## 2020-12-31 ENCOUNTER — Ambulatory Visit: Payer: Medicare Other | Admitting: Cardiovascular Disease

## 2020-12-31 ENCOUNTER — Ambulatory Visit: Payer: Medicare Other | Admitting: Adult Health

## 2020-12-31 ENCOUNTER — Ambulatory Visit (INDEPENDENT_AMBULATORY_CARE_PROVIDER_SITE_OTHER): Payer: Medicare Other | Admitting: Cardiovascular Disease

## 2020-12-31 VITALS — BP 152/76 | HR 84 | Ht 70.0 in | Wt 195.0 lb

## 2020-12-31 DIAGNOSIS — E782 Mixed hyperlipidemia: Secondary | ICD-10-CM | POA: Diagnosis not present

## 2020-12-31 DIAGNOSIS — I779 Disorder of arteries and arterioles, unspecified: Secondary | ICD-10-CM | POA: Insufficient documentation

## 2020-12-31 DIAGNOSIS — I10 Essential (primary) hypertension: Secondary | ICD-10-CM | POA: Diagnosis not present

## 2020-12-31 DIAGNOSIS — I6523 Occlusion and stenosis of bilateral carotid arteries: Secondary | ICD-10-CM

## 2020-12-31 DIAGNOSIS — I739 Peripheral vascular disease, unspecified: Secondary | ICD-10-CM | POA: Diagnosis not present

## 2020-12-31 DIAGNOSIS — Z720 Tobacco use: Secondary | ICD-10-CM | POA: Diagnosis not present

## 2020-12-31 DIAGNOSIS — I480 Paroxysmal atrial fibrillation: Secondary | ICD-10-CM

## 2020-12-31 DIAGNOSIS — I2510 Atherosclerotic heart disease of native coronary artery without angina pectoris: Secondary | ICD-10-CM | POA: Diagnosis not present

## 2020-12-31 NOTE — Assessment & Plan Note (Signed)
History of PAF maintaining sinus rhythm on Xarelto oral anticoagulation. °

## 2020-12-31 NOTE — Progress Notes (Signed)
12/31/2020 Brady Crumb Sr.   1950/10/01  [id number]  Primary Physician Billie Ruddy, MD Primary Cardiologist: [name] MD Lupe Carney, [place]  HPI:  Brady Crumb Sr. is a 70 y.o.   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 He was previously a patient of Dr. Durwin Nora Little's and now sees Dr. Claudie Leach. I last saw him in the office 08/27/2019.Marland Kitchen He has a history of CAD status post RCA stenting back in 2005 with a known chronically occluded circumflex and normal LV function. His cardiac risk factor profile is notable for tobacco abuse and treated hyperlipidemia. He denies chest pain or shortness of breath. I stented his right common iliac artery 12/15/99 with a balloon-expandable stent (8 mm x 2 cm). He had excellent angiographic and clinical results. Over the last 2-3 years she's had progressive claudication in his right hip buttock and leg. Recent workup performed by Dr. Claudie Leach revealed a right ABI of 0.43 with what appeared to be an occluded right common iliac and SFA. A CT scan confirmed iliac occlusion. Since I saw him in the office 01/22/14 he had arterial Doppler studies performed 01/30/14 revealing a right ABI of 0.31 with an occluded right common iliac and SFA. His left ABI was 25 with a high frequency signal in his distal left SFA. He is symptomatic on the right with Rutherford class IV claudication. He also had a nodule on his preoperative chest x-ray which was confirmed to be a 5 x 6 mm millimeter right upper lobe nodule by CT scanning suspicious for malignancy and patient with a history of tobacco abuse. He has an appointment to see Dr. Keturah Barre next month for further evaluation of this. I performed angiography on him 03/16/14 and was able to percutaneously recanalize his right common iliac artery chronic total occlusion and placed a 7 mm x 30 mm long  ICast  covered stent. His right ABI improved from .31-.54 and his right hip claudication has completely resolved. Since I saw him to half years ago he's remained stable. Does continue to smoke a pack a day. He denies chest pain, shortness of breath But does complain of bilateral lower extremity lifestyle including claudication. His Dopplers performed 5/70/18 to decline in his Left ABI From 0.76- 0. 59 with a newly occluded left SFA. I performed left common iliac PTA and stenting on 7/2. He has occluded bilateral SFAs and underwent attempt at right SFA CTO intervention by myself 11/30/16 which was failed and aborted because of inability to cross the proximal cap. His claudication markedly improved with revascularization of his right iliac CTO although he still has mild lifestyle limiting claudication.   Since I saw him in the office a year and a half ago he did stop smoking in July of this year (he and his wife.  On November 01, 2020).  He did develop paroxysmal atrial fib and currently is on Xarelto maintaining sinus rhythm.  He was hospitalized on 10/31/2020 for a week and a half with COPD exacerbation and PAF as well as small bowel obstruction.  He currently denies claudication.  He is chronically short of breath.  He does occasionally get chest pain but this is rare.     Current Meds  Medication Sig   albuterol (VENTOLIN HFA) 108 (90 Base) MCG/ACT inhaler Inhale 2 puffs into the lungs every 6 (six) hours  as needed for wheezing or shortness of breath.   clopidogrel (PLAVIX) 75 MG tablet TAKE 1 TABLET(75 MG) BY MOUTH DAILY (Patient taking differently: Take 75 mg by mouth daily.)   Coenzyme Q10 (COQ10) 100 MG CAPS Take 100 mg by mouth daily.   fexofenadine (ALLEGRA) 180 MG tablet Take 180 mg by mouth daily.    fluticasone (FLONASE) 50 MCG/ACT nasal spray Place 1 spray into both nostrils daily.   Fluticasone-Umeclidin-Vilant (TRELEGY ELLIPTA) 100-62.5-25 MCG/INH AEPB Inhale 1 puff into the lungs daily.    ipratropium-albuterol (DUONEB) 0.5-2.5 (3) MG/3ML SOLN Take 3 mLs by nebulization every 6 (six) hours as needed. (Patient taking differently: Take 3 mLs by nebulization every 6 (six) hours as needed (shortness of breath).)   rivaroxaban (XARELTO) 20 MG TABS tablet Take 1 tablet (20 mg total) by mouth daily with supper.   rosuvastatin (CRESTOR) 20 MG tablet Take 1 tablet (20 mg total) by mouth daily. TAKE 1 TABLET(20 MG) BY MOUTH DAILY   sertraline (ZOLOFT) 50 MG tablet Take 1 tablet (50 mg total) by mouth daily.   spironolactone (ALDACTONE) 25 MG tablet Take 1 tablet (25 mg total) by mouth daily.   triamcinolone (NASACORT) 55 MCG/ACT AERO nasal inhaler Place 2 sprays into the nose daily as needed (allergies).     Allergies  Allergen Reactions   Amlodipine Cough   Aspirin Other (See Comments)    Sneezes, watery nose, wheeze (no polyps)    Contrast Media [Iodinated Diagnostic Agents]     Need to be pre-medicated, sneezing, watery eyes   Erythromycin Hives    Childhood allergy All mycin drugs    Losartan Potassium Cough   Penicillins     Childhood allergy Has patient had a PCN reaction causing immediate rash, facial/tongue/throat swelling, SOB or lightheadedness with hypotension: Unknown Has patient had a PCN reaction causing severe rash involving mucus membranes or skin necrosis: Unknown Has patient had a PCN reaction that required hospitalization: No Has patient had a PCN reaction occurring within the last 10 years: No States he has had benadryl when taking this and had no problems recently     Simvastatin Other (See Comments)    Hip pain   Sulfa Antibiotics     unknown    Social History   Socioeconomic History   Marital status: Married    Spouse name: Olin Hauser   Number of children: 2   Years of education: Not on file   Highest education level: Not on file  Occupational History   Occupation: textile manf  Tobacco Use   Smoking status: Every Day    Packs/day: 2.00    Years:  50.00    Pack years: 100.00    Types: Cigarettes   Smokeless tobacco: Never   Tobacco comments:    currently smoking 1ppd as of 4/8  Vaping Use   Vaping Use: Never used  Substance and Sexual Activity   Alcohol use: Yes    Comment: 10/30/2016 "might have a few drinks 4-5 times/year"   Drug use: No   Sexual activity: Not Currently  Other Topics Concern   Not on file  Social History Narrative   Not on file   Social Determinants of Health   Financial Resource Strain: Not on file  Food Insecurity: Not on file  Transportation Needs: Not on file  Physical Activity: Not on file  Stress: Not on file  Social Connections: Not on file  Intimate Partner Violence: Not on file     Review of Systems: General:  negative for chills, fever, night sweats or weight changes.  Cardiovascular: negative for chest pain, dyspnea on exertion, edema, orthopnea, palpitations, paroxysmal nocturnal dyspnea or shortness of breath Dermatological: negative for rash Respiratory: negative for cough or wheezing Urologic: negative for hematuria Abdominal: negative for nausea, vomiting, diarrhea, bright red blood per rectum, melena, or hematemesis Neurologic: negative for visual changes, syncope, or dizziness All other systems reviewed and are otherwise negative except as noted above.    Blood pressure (!) 152/76, pulse 84, height '5\' 10"'$  (1.778 m), weight 195 lb (88.5 kg), SpO2 92 %.  General appearance: alert and no distress Neck: no adenopathy, no carotid bruit, no JVD, supple, symmetrical, trachea midline, and thyroid not enlarged, symmetric, no tenderness/mass/nodules Lungs: clear to auscultation bilaterally Heart: regular rate and rhythm, S1, S2 normal, no murmur, click, rub or gallop Extremities: extremities normal, atraumatic, no cyanosis or edema Pulses: Absent pedal pulses Skin: Skin color, texture, turgor normal. No rashes or lesions Neurologic: Grossly normal  EKG sinus rhythm at 84 with small  inferior Q waves and nonspecific ST and T wave changes.  I personally reviewed this EKG.  ASSESSMENT AND PLAN:   Peripheral arterial disease ([place]) StatusHistory of peripheral arterial disease post right iliac stenting and restenting.  He failed attempt at right SFA recanalization of a CTO.  His last intervention by myself was 03/16/2014.  The failed attempt at SFA intervention was 09/04/2016.  He does have occluded SFAs bilaterally but denies claudication.  His most recent Doppler studies performed 10/20/2020 revealed patent iliac stents  Coronary artery disease History of CAD status post RCA stenting back in 2005 with a known chronically occluded circumflex and normal LV function.  He does complain of shortness of breath probably related to his long history tobacco abuse and COPD.  He does get occasional chest pain.  Hyperlipidemia History of hyperlipidemia on statin therapy with lipid profile performed 11/16/2019 revealing total cholesterol 130, LDL 61 HDL 55.  Tobacco abuse Long history of tobacco abuse with COPD and chronic shortness of breath.  Both he and his wife stopped smoking on 11/02/2018.  Essential hypertension History of essential hypertension currently not on antihypertensive medications.  Blood pressure today is 152/76.  At home it is somewhat lower than this.  He has been on diltiazem and amlodipine in the past which he did not tolerate.  I am going to have him keep a 30-day blood pressure log and see a Pharm.D. back in 4 weeks to determine whether he needs additional antihypertensive medications.  Carotid artery disease ([place]) Carotid Dopplers performed by Dr. Claudie Leach 11/07/2013 revealed moderate bilateral ICA stenosis.  This will be repeated in 1 year.     [name] MD FACP,FACC,FAHA, [facility] 12/31/2020 9:19 AM

## 2020-12-31 NOTE — Assessment & Plan Note (Signed)
Carotid Dopplers performed by Dr. Claudie Leach 11/07/2013 revealed moderate bilateral ICA stenosis.  This will be repeated in 1 year.

## 2020-12-31 NOTE — Assessment & Plan Note (Signed)
History of hyperlipidemia on statin therapy with lipid profile performed 11/16/2019 revealing total cholesterol 130, LDL 61 HDL 55.

## 2020-12-31 NOTE — Patient Instructions (Addendum)
Medication Instructions:  Your physician recommends that you continue on your current medications as directed. Please refer to the Current Medication list given to you today.  *If you need a refill on your cardiac medications before your next appointment, please call your pharmacy*  Testing/Procedures: Lower Extremity Arterial Dopplers in 1 year Carotid Doppler in 1 year   Follow-Up: At [facility], you and your health needs are our priority.  As part of our continuing mission to provide you with exceptional heart care, we have created designated Provider Care Teams.  These Care Teams include your primary Cardiologist (physician) and Advanced Practice Providers (APPs -  Physician Assistants and Nurse Practitioners) who all work together to provide you with the care you need, when you need it.  We recommend signing up for the patient portal called "MyChart".  Sign up information is provided on this After Visit Summary.  MyChart is used to connect with patients for Virtual Visits (Telemedicine).  Patients are able to view lab/test results, encounter notes, upcoming appointments, etc.  Non-urgent messages can be sent to your provider as well.   To learn more about what you can do with MyChart, go to [email or web address].    Follow up in 1 month with clinical pharmacy team -- blood pressure   Follow up with Jory Sims DNP in 6 months  Follow up with Dr. Gwenlyn Found in 1 year

## 2020-12-31 NOTE — Assessment & Plan Note (Signed)
StatusHistory of peripheral arterial disease post right iliac stenting and restenting.  He failed attempt at right SFA recanalization of a CTO.  His last intervention by myself was 03/16/2014.  The failed attempt at SFA intervention was 09/04/2016.  He does have occluded SFAs bilaterally but denies claudication.  His most recent Doppler studies performed 10/20/2020 revealed patent iliac stents

## 2020-12-31 NOTE — Assessment & Plan Note (Signed)
History of essential hypertension currently not on antihypertensive medications.  Blood pressure today is 152/76.  At home it is somewhat lower than this.  He has been on diltiazem and amlodipine in the past which he did not tolerate.  I am going to have him keep a 30-day blood pressure log and see a Pharm.D. back in 4 weeks to determine whether he needs additional antihypertensive medications.

## 2020-12-31 NOTE — Assessment & Plan Note (Signed)
History of CAD status post RCA stenting back in 2005 with a known chronically occluded circumflex and normal LV function.  He does complain of shortness of breath probably related to his long history tobacco abuse and COPD.  He does get occasional chest pain.

## 2020-12-31 NOTE — Assessment & Plan Note (Signed)
Long history of tobacco abuse with COPD and chronic shortness of breath.  Both he and his wife stopped smoking on 11/02/2018.

## 2021-01-17 ENCOUNTER — Telehealth: Payer: Self-pay | Admitting: *Deleted

## 2021-01-17 NOTE — Telephone Encounter (Signed)
Pt would like to do medicare wellness visit in November or December. He will call back to schedule that .

## 2021-02-03 ENCOUNTER — Other Ambulatory Visit: Payer: Self-pay

## 2021-02-03 ENCOUNTER — Ambulatory Visit (INDEPENDENT_AMBULATORY_CARE_PROVIDER_SITE_OTHER): Payer: Medicare Other | Admitting: Pharmacist

## 2021-02-03 VITALS — BP 158/86 | HR 75 | Resp 17 | Ht 70.0 in | Wt 197.0 lb

## 2021-02-03 DIAGNOSIS — I6523 Occlusion and stenosis of bilateral carotid arteries: Secondary | ICD-10-CM

## 2021-02-03 DIAGNOSIS — I480 Paroxysmal atrial fibrillation: Secondary | ICD-10-CM | POA: Diagnosis not present

## 2021-02-03 DIAGNOSIS — I10 Essential (primary) hypertension: Secondary | ICD-10-CM | POA: Diagnosis not present

## 2021-02-03 MED ORDER — OLMESARTAN MEDOXOMIL 20 MG PO TABS: 20.0000 mg | ORAL_TABLET | Freq: Every day | ORAL | 2 refills | Status: DC

## 2021-02-03 MED ORDER — RIVAROXABAN 20 MG PO TABS: 20.0000 mg | ORAL_TABLET | Freq: Every day | ORAL | 11 refills | Status: DC

## 2021-02-03 NOTE — Patient Instructions (Addendum)
It was nice seeing you today!  We would like your blood pressure to be less than 130/80  Please continue your spironolactone 25mg  once a day  We will start a new medication called olmesartan 20mg  which you will take once daily  If you develop a cough, please give [place] a call and we will stop it  Try to watch your salt intake and increase physical activity as much as you can  Continue to monitor your blood pressure at home  Please check your lab work in 1-2 weeks  We will see you back in 1 month  Please call with any questions!  Karren Cobble, PharmD, BCACP, [place], [place] [address], [place], [place] [address] Phone: [phone]; Fax: [phone] 02/03/2021 9:05 AM

## 2021-02-03 NOTE — Progress Notes (Signed)
Patient ID: Brady HATFIELD Sr.                 DOB: 02-Jul-1950                      MRN: [id number]     HPI: Brady STANDISH Sr. is a 70 y.o. male referred by Dr. Gwenlyn Found to HTN clinic. PMH is significant for CAD, history of tobacco use, COPD, and HLD.   Hospitalized on 10/31/20 for COPD exacerbation. Patient quit smoking after discharge but was diagnosed with A Fib.  Patient presents today in good spirits.  Reports SOB which he relates to COPD. Believes his BP increaes during SOB episodes.  Currently has daily Trelegy and at home nebulizer.    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  Only physical activity he gets is mowing his yard once weekly which takes him longer than it used to because he needs frequent breaks.    Currently only managed on spironolactone. Takes BP in the evening. Brought log:  10/2: 155/90 10/1: 138/72 9/23: 123/70 9/22: 150/72 9/21: 145/95 9/20: 142/70 9/19: 145/83 9/18: 145/80  Patient reports his diet is high in salt.  Eats fast food 2-3 times a week. At home he eats soups, stir fry, stews, and salads.  Is not sure about the salt content.  Current HTN meds: spironolactone 25mg  daily Previously tried:  losartan (cough),  amlodipine (cough) Diltiazem Metoprolol HCTZ (hypokalemia)  BP goal: <130/80   Wt Readings from Last 3 Encounters:  12/31/20 195 lb (88.5 kg)  12/17/20 197 lb 6.4 oz (89.5 kg)  12/17/20 196 lb (88.9 kg)   BP Readings from Last 3 Encounters:  12/31/20 (!) 152/76  12/17/20 (!) 142/70  12/17/20 (!) 142/62   Pulse Readings from Last 3 Encounters:  12/31/20 84  12/17/20 70  12/17/20 (!) 59    Renal function: CrCl cannot be calculated (Patient's most recent lab result is older than the maximum 21 days allowed.).  Past Medical History:  Diagnosis Date   COPD (chronic obstructive pulmonary disease) ([place])    Coronary artery disease    a. s/p RCA stenting back in  2005 with a known chronically occluded circumflex and normal LV function.   Emphysema lung (HCC)    GERD (gastroesophageal reflux disease)    Heart attack ([place]) <2005   History of tobacco abuse    Hyperlipidemia    Hypertension    Lung nodule    a. Suspicious for malignancy, undergoing workup.   Obesity    Peripheral arterial disease ([place])    a. history of stent to right common iliac artery 12/15/99 with a peak for balloon-expandable stent. b. s/p intervention on RCIA total occlusion PTA and stent, residual disease on the right for possible staged intervention, notable disease on the left but asymptomatic in 2015. c. 10/2016: s/p PTA and stenting of left common iliac    Current Outpatient Medications on File Prior to Visit  Medication Sig Dispense Refill   albuterol (VENTOLIN HFA) 108 (90 Base) MCG/ACT inhaler Inhale 2 puffs into the lungs every 6 (six) hours as needed for wheezing or shortness of breath. 18 g 12   clopidogrel (PLAVIX) 75 MG tablet TAKE 1 TABLET(75 MG) BY MOUTH DAILY (Patient taking differently: Take 75 mg by mouth daily.) 90 tablet 3   Coenzyme Q10 (COQ10) 100 MG CAPS Take 100 mg by mouth daily.  fexofenadine (ALLEGRA) 180 MG tablet Take 180 mg by mouth daily.      fluticasone (FLONASE) 50 MCG/ACT nasal spray Place 1 spray into both nostrils daily. 48 g 4   Fluticasone-Umeclidin-Vilant (TRELEGY ELLIPTA) 100-62.5-25 MCG/INH AEPB Inhale 1 puff into the lungs daily. 60 each 12   ipratropium-albuterol (DUONEB) 0.5-2.5 (3) MG/3ML SOLN Take 3 mLs by nebulization every 6 (six) hours as needed. (Patient taking differently: Take 3 mLs by nebulization every 6 (six) hours as needed (shortness of breath).) 75 mL prn   rivaroxaban (XARELTO) 20 MG TABS tablet Take 1 tablet (20 mg total) by mouth daily with supper. 32 tablet 0   rosuvastatin (CRESTOR) 20 MG tablet Take 1 tablet (20 mg total) by mouth daily. TAKE 1 TABLET(20 MG) BY MOUTH DAILY 30 tablet 10   sertraline (ZOLOFT) 50 MG  tablet Take 1 tablet (50 mg total) by mouth daily. 90 tablet 1   spironolactone (ALDACTONE) 25 MG tablet Take 1 tablet (25 mg total) by mouth daily. 90 tablet 3   triamcinolone (NASACORT) 55 MCG/ACT AERO nasal inhaler Place 2 sprays into the nose daily as needed (allergies).     No current facility-administered medications on file prior to visit.    Allergies  Allergen Reactions   Amlodipine Cough   Aspirin Other (See Comments)    Sneezes, watery nose, wheeze (no polyps)    Contrast Media [Iodinated Diagnostic Agents]     Need to be pre-medicated, sneezing, watery eyes   Erythromycin Hives    Childhood allergy All mycin drugs    Losartan Potassium Cough   Penicillins     Childhood allergy Has patient had a PCN reaction causing immediate rash, facial/tongue/throat swelling, SOB or lightheadedness with hypotension: Unknown Has patient had a PCN reaction causing severe rash involving mucus membranes or skin necrosis: Unknown Has patient had a PCN reaction that required hospitalization: No Has patient had a PCN reaction occurring within the last 10 years: No States he has had benadryl when taking this and had no problems recently     Simvastatin Other (See Comments)    Hip pain   Sulfa Antibiotics     unknown     Assessment/Plan:  1. Hypertension -  Patient BP in room 158/86 which is above goal of <130/80.  Blood pressure unchanged from last visit.  Needs further BP lowering although diet likely not helping.  Recommended reducing salty and processed foods.  Patient developed cough with losartan.  Will attempt a trial of olmesartan 20mg  due to less incidence of cough.  Advised patient to call if he develops adverse effects.  Patient also needs lab work drawn since restarting spironolactone.  Will update in 1-2 weeks.  Recheck in clinic in 4 weeks.  Continue spironolactone 25mg  daily Start olmesartan 20mg  daily Check BMP in 1-2 weeks Recheck in clinic in 4 weeks  Karren Cobble,  PharmD, BCACP, [place], [place] [address] [place], [place] [address] Phone: [phone]; Fax: [phone] 02/03/2021 5:01 PM

## 2021-02-10 ENCOUNTER — Ambulatory Visit: Payer: Medicare Other | Admitting: Internal Medicine

## 2021-02-15 ENCOUNTER — Other Ambulatory Visit: Payer: Self-pay

## 2021-02-16 ENCOUNTER — Telehealth: Payer: Self-pay | Admitting: Pharmacist

## 2021-02-16 ENCOUNTER — Ambulatory Visit (INDEPENDENT_AMBULATORY_CARE_PROVIDER_SITE_OTHER): Payer: Medicare Other | Admitting: Family Medicine

## 2021-02-16 VITALS — BP 132/72 | HR 60 | Temp 98.4°F | Wt 197.8 lb

## 2021-02-16 DIAGNOSIS — Z23 Encounter for immunization: Secondary | ICD-10-CM

## 2021-02-16 DIAGNOSIS — I480 Paroxysmal atrial fibrillation: Secondary | ICD-10-CM | POA: Diagnosis not present

## 2021-02-16 DIAGNOSIS — I6523 Occlusion and stenosis of bilateral carotid arteries: Secondary | ICD-10-CM | POA: Diagnosis not present

## 2021-02-16 DIAGNOSIS — I10 Essential (primary) hypertension: Secondary | ICD-10-CM

## 2021-02-16 DIAGNOSIS — J449 Chronic obstructive pulmonary disease, unspecified: Secondary | ICD-10-CM

## 2021-02-16 NOTE — Telephone Encounter (Signed)
Called and left message on machine reminding patient to have labs drawn

## 2021-02-16 NOTE — Progress Notes (Signed)
Subjective:    Patient ID: Brady Crumb Sr., male    DOB: December 12, 1950, 70 y.o.   MRN: [id number]  Chief Complaint  Patient presents with   Follow-up    COPD, BP    HPI Patient was seen today for f/u up.  Notices thrush at times after using inhaler.  Inquires about what can be done for it.  Pt states he was put on xarelto in addition to plavix.  States xarelto was over $400 at the pharmacy so he did not get it.  Pt states breathing is good.  Using Trelegy and nebulizer.  Has to take short breaks with increased activity such as mowing the grass.  Went surf fishing recently, got wind burn and sun burn on lips.  States bp has been ok on current meds.    Inquires if he can have labs drawn here that were ordered by Cardiology.  Past Medical History:  Diagnosis Date   COPD (chronic obstructive pulmonary disease) ([place])    Coronary artery disease    a. s/p RCA stenting back in 2005 with a known chronically occluded circumflex and normal LV function.   Emphysema lung (HCC)    GERD (gastroesophageal reflux disease)    Heart attack ([place]) <2005   History of tobacco abuse    Hyperlipidemia    Hypertension    Lung nodule    a. Suspicious for malignancy, undergoing workup.   Obesity    Peripheral arterial disease ([place])    a. history of stent to right common iliac artery 12/15/99 with a peak for balloon-expandable stent. b. s/p intervention on RCIA total occlusion PTA and stent, residual disease on the right for possible staged intervention, notable disease on the left but asymptomatic in 2015. c. 10/2016: s/p PTA and stenting of left common iliac    Allergies  Allergen Reactions   Amlodipine Cough   Aspirin Other (See Comments)    Sneezes, watery nose, wheeze (no polyps)    Contrast Media [Iodinated Diagnostic Agents]     Need to be pre-medicated, sneezing, watery eyes   Eliquis [Apixaban] Nausea And Vomiting   Erythromycin Hives    Childhood allergy All mycin drugs    Losartan  Potassium Cough   Penicillins     Childhood allergy Has patient had a PCN reaction causing immediate rash, facial/tongue/throat swelling, SOB or lightheadedness with hypotension: Unknown Has patient had a PCN reaction causing severe rash involving mucus membranes or skin necrosis: Unknown Has patient had a PCN reaction that required hospitalization: No Has patient had a PCN reaction occurring within the last 10 years: No States he has had benadryl when taking this and had no problems recently     Simvastatin Other (See Comments)    Hip pain   Sulfa Antibiotics     unknown    ROS General: Denies fever, chills, night sweats, changes in weight, changes in appetite HEENT: Denies headaches, ear pain, changes in vision, rhinorrhea, sore throat CV: Denies CP, palpitations, SOB, orthopnea Pulm: Denies SOB, cough, wheezing GI: Denies abdominal pain, nausea, vomiting, diarrhea, constipation GU: Denies dysuria, hematuria, frequency Msk: Denies muscle cramps, joint pains Neuro: Denies weakness, numbness, tingling Skin: Denies rashes, bruising +wind burn on lips, sun burn on lips Psych: Denies depression, anxiety, hallucinations     Objective:    Blood pressure 132/72, pulse 60, temperature 98.4 F (36.9 C), temperature source Oral, weight 197 lb 12.8 oz (89.7 kg), SpO2 93 %.  Gen. Pleasant, well-nourished, in no distress, normal affect  HEENT: [place]/AT, face symmetric, conjunctiva clear, no scleral icterus, PERRLA, EOMI, nares patent without drainage Lungs: Barrel chest, no accessory muscle use, CTAB, no wheezes or rales Cardiovascular: RRR, no m/r/g, no peripheral edema Musculoskeletal: No deformities, no cyanosis or clubbing, normal tone Neuro:  A&Ox3, CN II-XII intact, normal gait Skin:  Warm, no lesions/ rash  Wt Readings from Last 3 Encounters:  02/16/21 197 lb 12.8 oz (89.7 kg)  02/03/21 197 lb (89.4 kg)  12/31/20 195 lb (88.5 kg)    Lab Results  Component Value Date   WBC  11.6 (H) 11/09/2020   HGB 15.9 11/09/2020   HCT 44.9 11/09/2020   PLT 108 (L) 11/09/2020   GLUCOSE 124 (H) 11/09/2020   CHOL 130 11/17/2019   TRIG 65 11/17/2019   HDL 55 11/17/2019   LDLCALC 61 11/17/2019   ALT 30 11/17/2019   AST 38 11/17/2019   NA 133 (L) 11/09/2020   K 4.1 11/09/2020   CL 101 11/09/2020   CREATININE 0.70 11/09/2020   BUN 13 11/09/2020   CO2 27 11/09/2020   TSH 0.87 09/12/2016   PSA 2.2 09/12/2016   INR 1.0 11/21/2016    Assessment/Plan:  COPD mixed type (HCC) -Stable.  On RA -Continue current medications including trelegy ellipta 100-62.5-25 mcg/inh, albuterol prn -continue f/u with Pulmonology  Flu vaccine need  - Plan: Flu Vaccine QUAD High Dose(Fluad)  Essential hypertension  -elevated -recheck -continue olmesartan 20 mg dialy and spironolactone 25 mg daily -for continued elevation increase medication - Plan: Basic metabolic panel  Paroxysmal atrial fibrillation (HCC)  -on anticoagulation. -per chart review unclear why xarelto was written instead of or in addition to plavix.  Likley inadvertently done as pt has no h/o thrombosis while taking plavix.  Pt advised to continue plavix 2/2 cost of Xarelto. -allergy to Eliquis, caused n/v -continue f/u with Cardiology - Plan: Basic metabolic panel  F/u prn in 3-4 months, sooner if needed.  Grier Mitts, MD

## 2021-02-17 LAB — BASIC METABOLIC PANEL
BUN: 12 mg/dL (ref 6–23)
CO2: 25 mEq/L (ref 19–32)
Calcium: 8.9 mg/dL (ref 8.4–10.5)
Chloride: 104 mEq/L (ref 96–112)
Creatinine, Ser: 0.84 mg/dL (ref 0.40–1.50)
GFR: 88.57 mL/min (ref >60.00)
Glucose, Bld: 131 mg/dL — ABNORMAL HIGH (ref 70–99)
Potassium: 4.2 mEq/L (ref 3.5–5.1)
Sodium: 139 mEq/L (ref 135–145)

## 2021-02-17 NOTE — Addendum Note (Signed)
Addended by: Amanda Cockayne on: 02/17/2021 02:41 PM   Modules accepted: Orders

## 2021-03-01 ENCOUNTER — Encounter: Payer: Self-pay | Admitting: Family Medicine

## 2021-03-01 ENCOUNTER — Other Ambulatory Visit: Payer: Self-pay | Admitting: Internal Medicine

## 2021-03-03 ENCOUNTER — Ambulatory Visit (INDEPENDENT_AMBULATORY_CARE_PROVIDER_SITE_OTHER): Payer: Medicare Other | Admitting: Pharmacist

## 2021-03-03 ENCOUNTER — Other Ambulatory Visit: Payer: Self-pay

## 2021-03-03 VITALS — BP 120/69 | HR 97 | Wt 196.0 lb

## 2021-03-03 DIAGNOSIS — I6523 Occlusion and stenosis of bilateral carotid arteries: Secondary | ICD-10-CM

## 2021-03-03 DIAGNOSIS — I10 Essential (primary) hypertension: Secondary | ICD-10-CM

## 2021-03-03 NOTE — Progress Notes (Signed)
Patient ID: Brady MOREFIELD Sr.                 DOB: June 10, 1950                      MRN: [id number]     HPI: Brady BEDOYA Sr. is a 70 y.o. male referred by Dr. Gwenlyn Found to HTN clinic. PMH is significant forCAD, history of tobacco use, COPD, and HLD.   Hospitalized on 10/31/20 for COPD exacerbation. Patient quit smoking after discharge but was diagnosed with A Fib.   Patient presents today in good spirits.  Reports SOB which he relates to COPD. Believes his BP increaes during SOB episodes.  Currently has daily Trelegy and at home nebulizer.     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  Only physical activity he gets is mowing his yard once weekly which takes him longer than it used to because he needs frequent breaks.    At last visit olmesartan was added to regimen.    Patient presents today in good spirits.  Brought home BP readings which are much improved.  11/2: 130/70 11/1: 135/58 10/29: 120/67 10/28: 128/79 10/26: 128/62 10/24: 132/70 10/23: 131/58 10/22: 122/70  Patient has not been experiencing the same type of coughing he experienced with losartan.  Has cut down on fast food and processed foods as well  BMP last month showed normal results  Current HTN meds:  Spironlactone 25mg  daily Olmesartan 20mg  daily  Previously tried:  losartan (cough) Diltiazem  BP goal: <130/80   Wt Readings from Last 3 Encounters:  02/16/21 197 lb 12.8 oz (89.7 kg)  02/03/21 197 lb (89.4 kg)  12/31/20 195 lb (88.5 kg)   BP Readings from Last 3 Encounters:  02/16/21 132/72  02/03/21 (!) 158/86  12/31/20 (!) 152/76   Pulse Readings from Last 3 Encounters:  02/16/21 60  02/03/21 75  12/31/20 84    Renal function: CrCl cannot be calculated (Unknown ideal weight.).  Past Medical History:  Diagnosis Date   COPD (chronic obstructive pulmonary disease) ([place])    Coronary artery disease    a. s/p RCA stenting back in  2005 with a known chronically occluded circumflex and normal LV function.   Emphysema lung (HCC)    GERD (gastroesophageal reflux disease)    Heart attack ([place]) <2005   History of tobacco abuse    Hyperlipidemia    Hypertension    Lung nodule    a. Suspicious for malignancy, undergoing workup.   Obesity    Peripheral arterial disease ([place])    a. history of stent to right common iliac artery 12/15/99 with a peak for balloon-expandable stent. b. s/p intervention on RCIA total occlusion PTA and stent, residual disease on the right for possible staged intervention, notable disease on the left but asymptomatic in 2015. c. 10/2016: s/p PTA and stenting of left common iliac    Current Outpatient Medications on File Prior to Visit  Medication Sig Dispense Refill   albuterol (VENTOLIN HFA) 108 (90 Base) MCG/ACT inhaler INHALE 2 PUFFS INTO THE LUNGS EVERY 6 HOURS AS NEEDED FOR WHEEZING OR SHORTNESS OF BREATH 18 g 12   albuterol (VENTOLIN HFA) 108 (90 Base) MCG/ACT inhaler INHALE 2 PUFFS INTO THE LUNGS EVERY 6 HOURS AS NEEDED FOR WHEEZING OR SHORTNESS OF BREATH 18 g 12   clopidogrel (PLAVIX) 75 MG tablet TAKE 1 TABLET(75 MG) BY MOUTH  DAILY (Patient taking differently: Take 75 mg by mouth daily.) 90 tablet 3   Coenzyme Q10 (COQ10) 100 MG CAPS Take 100 mg by mouth daily.     fexofenadine (ALLEGRA) 180 MG tablet Take 180 mg by mouth daily.      fluticasone (FLONASE) 50 MCG/ACT nasal spray PLACE 1 SPRAY INTO BOTH NOSTRILS DAILY 48 g 4   Fluticasone-Umeclidin-Vilant (TRELEGY ELLIPTA) 100-62.5-25 MCG/INH AEPB Inhale 1 puff into the lungs daily. 60 each 12   ipratropium-albuterol (DUONEB) 0.5-2.5 (3) MG/3ML SOLN Take 3 mLs by nebulization every 6 (six) hours as needed. (Patient taking differently: Take 3 mLs by nebulization every 6 (six) hours as needed (shortness of breath).) 75 mL prn   olmesartan (BENICAR) 20 MG tablet Take 1 tablet (20 mg total) by mouth daily. 30 tablet 2   rivaroxaban (XARELTO) 20 MG  TABS tablet Take 1 tablet (20 mg total) by mouth daily with supper. (Patient not taking: Reported on 02/16/2021) 30 tablet 11   rosuvastatin (CRESTOR) 20 MG tablet Take 1 tablet (20 mg total) by mouth daily. TAKE 1 TABLET(20 MG) BY MOUTH DAILY 30 tablet 10   sertraline (ZOLOFT) 50 MG tablet Take 1 tablet (50 mg total) by mouth daily. 90 tablet 1   spironolactone (ALDACTONE) 25 MG tablet Take 1 tablet (25 mg total) by mouth daily. 90 tablet 3   triamcinolone (NASACORT) 55 MCG/ACT AERO nasal inhaler Place 2 sprays into the nose daily as needed (allergies).     No current facility-administered medications on file prior to visit.    Allergies  Allergen Reactions   Amlodipine Cough   Aspirin Other (See Comments)    Sneezes, watery nose, wheeze (no polyps)    Contrast Media [Iodinated Diagnostic Agents]     Need to be pre-medicated, sneezing, watery eyes   Eliquis [Apixaban] Nausea And Vomiting   Erythromycin Hives    Childhood allergy All mycin drugs    Losartan Potassium Cough   Penicillins     Childhood allergy Has patient had a PCN reaction causing immediate rash, facial/tongue/throat swelling, SOB or lightheadedness with hypotension: Unknown Has patient had a PCN reaction causing severe rash involving mucus membranes or skin necrosis: Unknown Has patient had a PCN reaction that required hospitalization: No Has patient had a PCN reaction occurring within the last 10 years: No States he has had benadryl when taking this and had no problems recently     Simvastatin Other (See Comments)    Hip pain   Sulfa Antibiotics     unknown     Assessment/Plan:  1. Hypertension -  Patient BP in room 120/69 which is at goal of <130/80 and similar to his home readings.  Tolerating olmesartan well and diet changes have likely made a positive impact.  Will continue current regimen and recheck as needed.  Continue spironolactone 25mg  daily Continue olmesartan 20mg  daily Recheck as  needed  Karren Cobble, PharmD, [place], [place], [place], [place], [place], [address] Phone: [phone], Fax: [phone]

## 2021-03-03 NOTE — Patient Instructions (Addendum)
It was good seeing you again  We would like your blood pressure to stay less than 130/80  Continue your spironolactone and olmesartan  Continue checking your blood pressure at home.  Please call us if it begins to trend up again  Continue your diet changes.  Continue trying to avoid the fast foods and salty foods  Please call with any questions!  Karren Cobble, PharmD, BCACP, [place], [place], [place], [place], [address] Phone: [phone], Fax: [phone]

## 2021-03-08 DIAGNOSIS — Z86010 Personal history of colonic polyps: Secondary | ICD-10-CM | POA: Diagnosis not present

## 2021-03-30 NOTE — Progress Notes (Signed)
HPI male one pack per day Smoker followed for lung nodules, tobacco abuse, COPD, complicated by CAD, PAD/claudication, HBP, Aspirin Allergy CT chest  03/06/14 2 incidental pulmonary nodules noted in the lungs bilaterally PFT 12/02/14-severe obstructive airways disease with insignificant response to bronchodilator, air trapping, moderately reduced diffusion. FEV1/FVC 0.59, RV 139%, TLC 93%, DLCO 62%.  -----------------------------------------------------------------------------------------------    08/06/20- 70 year old male one pack per day Smoker followed for Lung Nodules, tobacco abuse, COPD, complicated by CAD, PAD/claudication, HBP, aspirin allergy -Breztri, Nasacort, Ventolin hfa,  Covid  vax-3 Phizer Flu vax- had Still smoking and not motivated to quit- discussed again. Wants to retry Trelegy compared with Breztri. Paces himself for yard work but denies chest pain or acute events.  CT reviewed. CT chest 03/11/20-  IMPRESSION: 1. Interval resolution of previous right lower lobe airspace consolidation compatible with a resolved inflammatory/infectious process. 2. Stable small pulmonary nodules. These have remained stable since 01/07/2016 and are considered benign. 3. Severe changes of centrilobular and paraseptal emphysema. 4. Coronary artery calcifications. Aortic Atherosclerosis (ICD10-I70.0) and Emphysema (ICD10-J43.9).  03/31/21- 70 year old male one pack per day Smoker(100 pk yrs) followed for Lung Nodules, tobacco abuse, COPD, complicated by CAD, PAD/claudication,PAFib/ Eliquis,  HTN, aspirin allergy -Trelegy 100 Nasacort, Ventolin hfa, neb Duoneb Covid  vax-4 Phizer Flu vax- had Hosp July for COPD exacerbation> nicotine patch, -----Follow up for COPD. Using Trelegy daily and albuterol prn. Uses nebulizer daily at night.  Notices that his DuoNeb by nebulizer causes a fine tremor but does not set off his atrial fibrillation which is paroxysmal.  He continues Trelegy and rescue  albuterol as before.  Had questions about steroid nasal sprays. CTa chest 11/01/20- IMPRESSION: No definite evidence of pulmonary embolus. Coronary artery calcifications are noted suggesting coronary artery disease. Aortic Atherosclerosis (ICD10-I70.0) and Emphysema (ICD10-J43.9).   ROS-see HPI     + = positve Constitutional:   No-   weight loss, night sweats, fevers, chills, fatigue, lassitude. HEENT:   No-  headaches, difficulty swallowing, tooth/dental problems, sore throat,       No-  sneezing, itching, ear ache, nasal congestion, +post nasal drip,  CV:  No-   chest pain, orthopnea, PND, swelling in lower extremities, anasarca,  dizziness, palpitations Resp: +   shortness of breath with exertion or at rest.              No-   productive cough,  + non-productive cough,  No- coughing up of blood.              No-   change in color of mucus.  No- wheezing.   Skin: No-   rash or lesions. GI:  No-   heartburn, indigestion, abdominal pain, nausea, vomiting GU:  MS:  No-   joint pain or swelling.   Neuro-     nothing unusual Psych:  No- change in mood or affect. No depression or anxiety.  No memory loss.  OBJ- Physical Exam General- Alert, Oriented, Affect-appropriate, Distress- none acute,  Skin- rash-none, lesions- none, excoriation- none,  Lymphadenopathy- none Head- atraumatic            Eyes- Gross vision intact, PERRLA, conjunctivae and secretions clear            Ears- Hearing okay,             Nose- +turbinate edema, no-Septal dev, mucus, polyps, erosion, perforation             Throat- Mallampati III , mucosa clear , drainage- none, tonsils-  atrophic, own teeth Neck- flexible , trachea midline, no stridor , thyroid nl, carotid no bruit Chest - symmetrical excursion , unlabored           Heart/CV- RRR , no murmur , no gallop  , no rub, nl s1 s2                           - JVD- none , edema- none, stasis changes- none, varices- none           Lung-  unlabored, ckear/  diminished, wheeze- none, cough- none , dullness-none, rub- none           Chest wall-  Abd- Br/ Gen/ Rectal- Not done, not indicated Extrem- cyanosis- none, clubbing, none, atrophy- none, strength- nl Neuro- grossly intact to observation

## 2021-03-31 ENCOUNTER — Ambulatory Visit (INDEPENDENT_AMBULATORY_CARE_PROVIDER_SITE_OTHER): Payer: Medicare Other | Admitting: Internal Medicine

## 2021-03-31 ENCOUNTER — Encounter: Payer: Self-pay | Admitting: Internal Medicine

## 2021-03-31 ENCOUNTER — Other Ambulatory Visit: Payer: Self-pay

## 2021-03-31 DIAGNOSIS — I480 Paroxysmal atrial fibrillation: Secondary | ICD-10-CM

## 2021-03-31 DIAGNOSIS — J449 Chronic obstructive pulmonary disease, unspecified: Secondary | ICD-10-CM | POA: Diagnosis not present

## 2021-03-31 DIAGNOSIS — R911 Solitary pulmonary nodule: Secondary | ICD-10-CM | POA: Diagnosis not present

## 2021-03-31 DIAGNOSIS — I6523 Occlusion and stenosis of bilateral carotid arteries: Secondary | ICD-10-CM | POA: Diagnosis not present

## 2021-03-31 NOTE — Patient Instructions (Signed)
Ok to continue current meds. As discussed, you can try stopping your nebulizer treatment before you use up the whole dose, as a way of reducing tremor.  If you want, you can always come back and finish the dose later.  Please call if we can help

## 2021-03-31 NOTE — Assessment & Plan Note (Signed)
He is clinically in sinus rhythm at this visit.  So far not seeing any tendency for his inhaled medicines to trigger an exacerbation of his heart rhythm.

## 2021-03-31 NOTE — Assessment & Plan Note (Signed)
Small nodules stable as of chest CT 03/11/2020

## 2021-03-31 NOTE — Assessment & Plan Note (Signed)
He has resolved acute exacerbation from mid summer and now seems to be doing fairly well.  We discussed stimulation by his nebulizer treatments as common.  He can try splitting the dose.  So far he cannot tell that any of his respiratory medicines aggravate atrial fibrillation.

## 2021-04-01 DIAGNOSIS — Z1211 Encounter for screening for malignant neoplasm of colon: Secondary | ICD-10-CM | POA: Diagnosis not present

## 2021-04-01 DIAGNOSIS — Z86010 Personal history of colonic polyps: Secondary | ICD-10-CM | POA: Diagnosis not present

## 2021-04-01 DIAGNOSIS — K635 Polyp of colon: Secondary | ICD-10-CM | POA: Diagnosis not present

## 2021-04-06 DIAGNOSIS — K635 Polyp of colon: Secondary | ICD-10-CM | POA: Diagnosis not present

## 2021-04-10 ENCOUNTER — Encounter: Payer: Self-pay | Admitting: Pharmacist

## 2021-04-11 ENCOUNTER — Telehealth: Payer: Self-pay | Admitting: Pharmacist

## 2021-04-11 NOTE — Telephone Encounter (Signed)
Patient had sent over a myChart message this morning regarding ulcerated skin on his legs stretching up to his groin area.  Reports he had a colonoscopy 2 weeks ago and held his spironolactone and Plavix.  While he held them, the ulcers healed.  When he restarted the medications the ulcers reappeared. Advised for him to hold his spironolactone for the time being to see if this is the cause of the reaction.  Patient voiced understanding.

## 2021-05-09 DIAGNOSIS — Z20822 Contact with and (suspected) exposure to covid-19: Secondary | ICD-10-CM | POA: Diagnosis not present

## 2021-06-03 DIAGNOSIS — Z20822 Contact with and (suspected) exposure to covid-19: Secondary | ICD-10-CM | POA: Diagnosis not present

## 2021-06-10 ENCOUNTER — Other Ambulatory Visit: Payer: Self-pay | Admitting: Family Medicine

## 2021-06-10 DIAGNOSIS — F320 Major depressive disorder, single episode, mild: Secondary | ICD-10-CM

## 2021-07-04 ENCOUNTER — Other Ambulatory Visit: Payer: Self-pay | Admitting: Cardiovascular Disease

## 2021-07-04 DIAGNOSIS — I10 Essential (primary) hypertension: Secondary | ICD-10-CM

## 2021-07-08 DIAGNOSIS — Z23 Encounter for immunization: Secondary | ICD-10-CM | POA: Diagnosis not present

## 2021-07-10 DIAGNOSIS — U071 COVID-19: Secondary | ICD-10-CM | POA: Diagnosis not present

## 2021-07-10 DIAGNOSIS — R051 Acute cough: Secondary | ICD-10-CM | POA: Diagnosis not present

## 2021-08-27 ENCOUNTER — Other Ambulatory Visit: Payer: Self-pay | Admitting: Internal Medicine

## 2021-08-30 NOTE — Telephone Encounter (Signed)
Trelegy refilled.

## 2021-09-08 ENCOUNTER — Other Ambulatory Visit: Payer: Self-pay | Admitting: Adult Health

## 2021-09-09 ENCOUNTER — Other Ambulatory Visit: Payer: Self-pay | Admitting: Cardiovascular Disease

## 2021-09-09 ENCOUNTER — Other Ambulatory Visit: Payer: Self-pay | Admitting: Adult Health

## 2021-09-23 ENCOUNTER — Other Ambulatory Visit: Payer: Self-pay | Admitting: Cardiovascular Disease

## 2021-10-05 NOTE — Progress Notes (Signed)
Cardiology Clinic Note   Patient Name: Brady ARNESEN Sr. Date of Encounter: 10/07/2021  Primary Care Provider:  Billie Ruddy, MD Primary Cardiologist:  [name], MD  Patient Profile    71 year old male patient with known history of coronary artery disease, status post stenting to the RCA in 2005 with a known CTO circumflex and normal LV function, hyperlipidemia, carotid artery disease with most recent Doppler study in 2015 with moderate bilateral ICA stenosis, (follow-up carotid ultrasound was planned for September 2022, at the time of this office visit and had not been completed); history of tobacco abuse, PAD status post stenting to the right common iliac artery on 12/15/1999 with a balloon expandable stent (8 mm x 2 cm).    A follow-up CT scan completed due to abnormal ABIs in 2015 revealed a occluded right common iliac and SFA.  Dr. Gwenlyn Found performed percutaneous recanalization of his right common iliac artery, with chronic total occlusion and placed 7 mg there by 30 mm long iCAST covered stent.  This resolved right hip pain and intermittent claudication symptoms.  This was followed by claudication symptoms on his left, with ABIs abnormal, followed by left common iliac PTA and stenting in July 2018 by Dr. Gwenlyn Found.  He was found to have bilateral SFA's which were occluded.  His symptoms of claudication were improved with revascularization of his right iliac CTO.  There was a failed attempt at SFA intervention on 09/04/2016.  At last office visit he denied complaints of claudication.  Most recent Doppler studies on 10/20/2020 revealed patent iliac stents.  Other history included paroxysmal atrial fibrillation, placed on Xarelto and had been maintaining normal sinus rhythm on last office visit on 12/31/2020.  Other history includes COPD, small bowel obstruction, lung nodule in the right upper lobe which was suspicious for malignancy.   Past Medical History    Past Medical History:  Diagnosis  Date   COPD (chronic obstructive pulmonary disease) ([place])    Coronary artery disease    a. s/p RCA stenting back in 2005 with a known chronically occluded circumflex and normal LV function.   Emphysema lung ([facility])    GERD (gastroesophageal reflux disease)    Heart attack ([place]) <2005   History of tobacco abuse    Hyperlipidemia    Hypertension    Lung nodule    a. Suspicious for malignancy, undergoing workup.   Obesity    Peripheral arterial disease ([place])    a. history of stent to right common iliac artery 12/15/99 with a peak for balloon-expandable stent. b. s/p intervention on RCIA total occlusion PTA and stent, residual disease on the right for possible staged intervention, notable disease on the left but asymptomatic in 2015. c. 10/2016: s/p PTA and stenting of left common iliac   Past Surgical History:  Procedure Laterality Date   CARDIAC CATHETERIZATION  10/26/1999   [facility] Endo 09/13/2010   Cardiolite study     59% ejection fraction and negative for ischemia   CORONARY ANGIOPLASTY WITH STENT PLACEMENT  2005   [place] stent placed to his RCA    [place] Right 12/15/1999   a. history of stent to right common iliac artery 12/15/99 with a peak for balloon-expandable stent. b. s/p intervention on RCIA total occlusion PTA and stent, residual disease on the right for possible staged intervention, notable disease on the left but asymptomatic.   ILIAC VEIN ANGIOPLASTY / STENTING Right 03/16/2014   rt common iliac  by dr berry   LAPAROSCOPIC CHOLECYSTECTOMY  1990s   LOWER EXTREMITY ANGIOGRAM N/A 03/16/2014   Procedure: LOWER EXTREMITY ANGIOGRAM;  Surgeon: [name], MD;  Location: [facility] CATH LAB;  Service: Cardiovascular;  Laterality: N/A;   LOWER EXTREMITY ANGIOGRAPHY N/A 10/30/2016   Procedure: Lower Extremity Angiography;  Surgeon: [name], MD;  Location: [facility] CV LAB;  Service: Cardiovascular;  Laterality: N/A;   LOWER EXTREMITY ANGIOGRAPHY N/A  11/30/2016   Procedure: Lower Extremity Angiography;  Surgeon: [name], MD;  Location: [facility] CV LAB;  Service: Cardiovascular;  Laterality: N/A;   PERIPHERAL VASCULAR INTERVENTION Left 10/30/2016   Procedure: Peripheral Vascular Intervention;  Surgeon: [name], MD;  Location: [facility] CV LAB;  Service: Cardiovascular;  Laterality: Left;  Lt Com ILIAC   TONSILLECTOMY AND ADENOIDECTOMY  8657Q   UMBILICAL HERNIA REPAIR  2012    Allergies  Allergies  Allergen Reactions   Amlodipine Cough   Aspirin Other (See Comments)    Sneezes, watery nose, wheeze (no polyps)    Contrast Media [Iodinated Contrast Media]     Need to be pre-medicated, sneezing, watery eyes   Eliquis [Apixaban] Nausea And Vomiting   Erythromycin Hives    Childhood allergy All mycin drugs    Losartan Potassium Cough   Penicillins     Childhood allergy Has patient had a PCN reaction causing immediate rash, facial/tongue/throat swelling, SOB or lightheadedness with hypotension: Unknown Has patient had a PCN reaction causing severe rash involving mucus membranes or skin necrosis: Unknown Has patient had a PCN reaction that required hospitalization: No Has patient had a PCN reaction occurring within the last 10 years: No States he has had benadryl when taking this and had no problems recently     Simvastatin Other (See Comments)    Hip pain   Sulfa Antibiotics     unknown    History of Present Illness    Brady Brooks presents today for ongoing assessment and management of atrial fib,CHADS VASc Score of 3, CAD, hyperlipidemia, carotid artery disease, and PAD of SFA bilaterally, and right iliac.  He comes today with out any cardiac complaints.  He does not have any issues with palpitations, heart racing, chest pain, or claudication symptoms.  He states that on occasion after heavy lifting he is having some urinary incontinence but this occurs infrequently.  He does admit to some numbness and  tingling at times in his left leg but not significantly so, preventing him from walking or ADLs.  He is medically compliant.  He has not had any lab work completed this year.  He has not been extensively affected by [place] despite working outside outside in his yard, he denies any worsening breathing status.  Home Medications    Current Outpatient Medications  Medication Sig Dispense Refill   albuterol (VENTOLIN HFA) 108 (90 Base) MCG/ACT inhaler INHALE 2 PUFFS INTO THE LUNGS EVERY 6 HOURS AS NEEDED FOR WHEEZING OR SHORTNESS OF BREATH 18 g 12   clopidogrel (PLAVIX) 75 MG tablet TAKE 1 TABLET(75 MG) BY MOUTH DAILY 90 tablet 1   Coenzyme Q10 (COQ10) 100 MG CAPS Take 100 mg by mouth daily.     diltiazem (CARDIZEM CD) 120 MG 24 hr capsule TAKE 1 CAPSULE(120 MG) BY MOUTH DAILY 30 capsule 0   diltiazem (CARDIZEM CD) 120 MG 24 hr capsule TAKE 1 CAPSULE(120 MG) BY MOUTH DAILY 30 capsule 0   fexofenadine (ALLEGRA) 180 MG tablet Take 180 mg by mouth  daily.      fluticasone (FLONASE) 50 MCG/ACT nasal spray PLACE 1 SPRAY INTO BOTH NOSTRILS DAILY 48 g 4   ipratropium-albuterol (DUONEB) 0.5-2.5 (3) MG/3ML SOLN Take 3 mLs by nebulization every 6 (six) hours as needed. (Patient taking differently: Take 3 mLs by nebulization every 6 (six) hours as needed (shortness of breath).) 75 mL prn   isosorbide mononitrate (IMDUR) 30 MG 24 hr tablet TAKE 1 TABLET(30 MG) BY MOUTH DAILY 30 tablet 0   isosorbide mononitrate (IMDUR) 30 MG 24 hr tablet TAKE 1 TABLET(30 MG) BY MOUTH DAILY 30 tablet 0   olmesartan (BENICAR) 20 MG tablet TAKE 1 TABLET(20 MG) BY MOUTH DAILY 90 tablet 3   rosuvastatin (CRESTOR) 20 MG tablet Take 1 tablet (20 mg total) by mouth daily. TAKE 1 TABLET(20 MG) BY MOUTH DAILY 30 tablet 10   sertraline (ZOLOFT) 50 MG tablet Take 1 tablet (50 mg total) by mouth daily. 90 tablet 1   TRELEGY ELLIPTA 100-62.5-25 MCG/ACT AEPB INHALE 1 PUFF INTO THE LUNGS DAILY 60 each 12   triamcinolone (NASACORT) 55  MCG/ACT AERO nasal inhaler Place 2 sprays into the nose daily as needed (allergies).     spironolactone (ALDACTONE) 25 MG tablet Take 1 tablet (25 mg total) by mouth daily. 90 tablet 3   No current facility-administered medications for this visit.     Family History    Family History  Problem Relation Age of Onset   Heart disease Father    Asthma Son    Lung cancer Paternal Grandfather    CAD Brother    CAD Brother    He indicated that his mother is alive. He indicated that his father is deceased. He indicated that two of his four brothers are alive. He indicated that the status of his paternal grandfather is unknown. He indicated that the status of his son is unknown.  Social History    Social History   Socioeconomic History   Marital status: Married    Spouse name: Olin Hauser   Number of children: 2   Years of education: Not on file   Highest education level: Not on file  Occupational History   Occupation: textile manf  Tobacco Use   Smoking status: Former    Packs/day: 2.00    Years: 50.00    Total pack years: 100.00    Types: Cigarettes    Quit date: 11/01/2020    Years since quitting: 0.9   Smokeless tobacco: Never   Tobacco comments:    currently smoking 1ppd as of 4/8  Vaping Use   Vaping Use: Never used  Substance and Sexual Activity   Alcohol use: Yes    Comment: 10/30/2016 "might have a few drinks 4-5 times/year"   Drug use: No   Sexual activity: Not Currently  Other Topics Concern   Not on file  Social History Narrative   Not on file   Social Determinants of Health   Financial Resource Strain: Not on file  Food Insecurity: Not on file  Transportation Needs: Not on file  Physical Activity: Not on file  Stress: Not on file  Social Connections: Not on file  Intimate Partner Violence: Not on file     Review of Systems    General:  No chills, fever, night sweats or weight changes.  Cardiovascular:  No chest pain, dyspnea on exertion, edema, orthopnea,  palpitations, paroxysmal nocturnal dyspnea. Dermatological: No rash, lesions/masses, ecchymosis is noted without evidence of bleeding or petechiae Respiratory: No cough, dyspnea Urologic:  No hematuria, dysuria Abdominal:   No nausea, vomiting, diarrhea, bright red blood per rectum, melena, or hematemesis Neurologic:  No visual changes, wkns, changes in mental status. All other systems reviewed and are otherwise negative except as noted above.     Physical Exam    VS:  BP 130/74   Pulse 87   Ht '5\' 10"'$  (1.778 m)   Wt 182 lb (82.6 kg)   SpO2 98%   BMI 26.11 kg/m  , BMI Body mass index is 26.11 kg/m.     GEN: Well nourished, well developed, in no acute distress. HEENT: normal. Neck: Supple, no JVD, soft bilateral carotid bruits, or masses. Cardiac: RRR, distant heart sounds, no murmurs, rubs, or gallops. No clubbing, cyanosis, edema.  Radials/DP/PT 2+ and equal bilaterally.  Respiratory:  Respirations regular and unlabored, clear to auscultation bilaterally.  With chronic bibasilar crackles GI: Soft, nontender, nondistended, BS + x 4. MS: no deformity or atrophy. Skin: warm and dry, no rash. Neuro:  Strength and sensation are intact. Psych: Normal affect.  Accessory Clinical Findings    ECG personally reviewed by me today-normal sinus rhythm, possible left atrial enlargement, T wave flattening lateral leads and inferior leads.  No acute changes  Lab Results  Component Value Date   WBC 11.6 (H) 11/09/2020   HGB 15.9 11/09/2020   HCT 44.9 11/09/2020   MCV 96.8 11/09/2020   PLT 108 (L) 11/09/2020   Lab Results  Component Value Date   CREATININE 0.84 02/17/2021   BUN 12 02/17/2021   NA 139 02/17/2021   K 4.2 02/17/2021   CL 104 02/17/2021   CO2 25 02/17/2021   Lab Results  Component Value Date   ALT 30 11/17/2019   AST 38 11/17/2019   ALKPHOS 71 11/17/2019   BILITOT 0.3 11/17/2019   Lab Results  Component Value Date   CHOL 130 11/17/2019   HDL 55 11/17/2019    LDLCALC 61 11/17/2019   TRIG 65 11/17/2019   CHOLHDL 2.4 11/17/2019    No results found for: "HGBA1C"  Review of Prior Studies: Echocardiogram 11/02/2020 1. Left ventricular ejection fraction, by estimation, is 40 to 45%. The  left ventricle has mildly decreased function. The left ventricle  demonstrates regional wall motion abnormalities (see scoring  diagram/findings for description). The left ventricular   internal cavity size was moderately dilated. There is mild concentric  left ventricular hypertrophy. Left ventricular diastolic parameters are  consistent with Grade II diastolic dysfunction (pseudonormalization).   2. Right ventricular systolic function is normal. The right ventricular  size is normal. Tricuspid regurgitation signal is inadequate for assessing  PA pressure.   3. Left atrial size was moderately dilated.   4. The mitral valve is grossly normal. No evidence of mitral valve  regurgitation. No evidence of mitral stenosis.   5. The aortic valve is calcified. There is mild calcification of the  aortic valve. There is moderate thickening of the aortic valve. Aortic  valve regurgitation is not visualized. Mild to moderate aortic valve  sclerosis/calcification is present, without  any evidence of aortic stenosis.   6. The inferior vena cava is normal in size with greater than 50%  respiratory variability, suggesting right atrial pressure of 3 mmHg.   Bilateral ABI's 10/20/2020 Summary:  Right: Resting right ankle-brachial index indicates moderate right lower  extremity arterial disease. The right toe-brachial index is abnormal.   Left: Resting left ankle-brachial index indicates moderate left lower  extremity arterial disease. The left toe-brachial index is  abnormal.   Assessment & Plan   1.  Coronary artery disease: He is without complaints of cardiac symptoms.  He remains active, working in his yard, no exertional dyspnea or discomfort.  We will continue current  medication regimen as directed.  Will not plan on any ischemic testing at this time as he is asymptomatic.  There was a question about whether to begin him on beta-blocker in the setting of coronary artery disease but with COPD would like to avoid this until necessary.  Can consider adding bisoprolol if this is required for better heart rate control or increase diltiazem.  2.  Hypertension: Well-controlled.  He did bring with him a copy of his blood pressures at home.  They range from 114/73 to the highest of 155/87.  Heart rate has been running in the 90s with Hycet 112.  We will continue to monitor this and adjust medications or add low-dose beta-blocker should this be required for better heart rate control.  3.  Hyperlipidemia: He remains on rosuvastatin 20 mg daily.  I am going to check fasting lipids and LFTs today for surveillance.  Goal of LDL less than 70.  4.  Peripheral arterial disease: He is due to have aortic IVC iliac Doppler studies on June 22 along with ABIs, and carotid studies completed.  He will follow-up with Dr. Gwenlyn Found if the patient is requiring intervention or further testing based upon the results.  5.  Stress incontinence: Occurs periodically.  He is advised to see his primary care provider to discuss this and possibly have referral to urologist at their discretion.  Current medicines are reviewed at length with the patient today.  I have spent 25 min's  dedicated to the care of this patient on the date of this encounter to include pre-visit review of records, assessment, management and diagnostic testing,with shared decision making. Signed, Phill Myron. West Pugh, ANP, AACC   10/07/2021 8:53 AM    [facility] [address] Office [phone] Fax [phone]  Notice: This dictation was prepared with Dragon dictation along with smaller phrase technology. Any transcriptional errors that result from this process are unintentional and may  not be corrected upon review.

## 2021-10-07 ENCOUNTER — Ambulatory Visit (INDEPENDENT_AMBULATORY_CARE_PROVIDER_SITE_OTHER): Payer: Medicare Other | Admitting: Adult Health

## 2021-10-07 ENCOUNTER — Encounter: Payer: Self-pay | Admitting: Adult Health

## 2021-10-07 VITALS — BP 130/74 | HR 87 | Ht 70.0 in | Wt 182.0 lb

## 2021-10-07 DIAGNOSIS — E7800 Pure hypercholesterolemia, unspecified: Secondary | ICD-10-CM

## 2021-10-07 DIAGNOSIS — N393 Stress incontinence (female) (male): Secondary | ICD-10-CM

## 2021-10-07 DIAGNOSIS — I10 Essential (primary) hypertension: Secondary | ICD-10-CM

## 2021-10-07 DIAGNOSIS — J449 Chronic obstructive pulmonary disease, unspecified: Secondary | ICD-10-CM | POA: Diagnosis not present

## 2021-10-07 DIAGNOSIS — I739 Peripheral vascular disease, unspecified: Secondary | ICD-10-CM | POA: Diagnosis not present

## 2021-10-07 DIAGNOSIS — I2510 Atherosclerotic heart disease of native coronary artery without angina pectoris: Secondary | ICD-10-CM

## 2021-10-07 DIAGNOSIS — Z95828 Presence of other vascular implants and grafts: Secondary | ICD-10-CM

## 2021-10-07 LAB — COMPREHENSIVE METABOLIC PANEL
ALT: 13 IU/L (ref 0–44)
AST: 18 IU/L (ref 0–40)
Albumin/Globulin Ratio: 2.1 (ref 1.2–2.2)
Albumin: 4.4 g/dL (ref 3.8–4.8)
Alkaline Phosphatase: 75 IU/L (ref 44–121)
BUN/Creatinine Ratio: 16 (ref 10–24)
BUN: 14 mg/dL (ref 8–27)
Bilirubin Total: 0.3 mg/dL (ref 0.0–1.2)
CO2: 23 mmol/L (ref 20–29)
Calcium: 9.1 mg/dL (ref 8.6–10.2)
Chloride: 101 mmol/L (ref 96–106)
Creatinine, Ser: 0.86 mg/dL (ref 0.76–1.27)
Globulin, Total: 2.1 g/dL (ref 1.5–4.5)
Glucose: 165 mg/dL — ABNORMAL HIGH (ref 70–99)
Potassium: 4.6 mmol/L (ref 3.5–5.2)
Sodium: 140 mmol/L (ref 134–144)
Total Protein: 6.5 g/dL (ref 6.0–8.5)
eGFR: 93 mL/min/{1.73_m2} (ref >59)

## 2021-10-07 LAB — CBC
Hematocrit: 46.5 % (ref 37.5–51.0)
Hemoglobin: 15.7 g/dL (ref 13.0–17.7)
MCH: 33.8 pg — ABNORMAL HIGH (ref 26.6–33.0)
MCHC: 33.8 g/dL (ref 31.5–35.7)
MCV: 100 fL — ABNORMAL HIGH (ref 79–97)
Platelets: 229 10*3/uL (ref 150–450)
RBC: 4.65 x10E6/uL (ref 4.14–5.80)
RDW: 11.8 % (ref 11.6–15.4)
WBC: 8.5 10*3/uL (ref 3.4–10.8)

## 2021-10-07 LAB — LIPID PANEL
Chol/HDL Ratio: 3.4 ratio (ref 0.0–5.0)
Cholesterol, Total: 194 mg/dL (ref 100–199)
HDL: 57 mg/dL (ref >39)
LDL Chol Calc (NIH): 115 mg/dL — ABNORMAL HIGH (ref 0–99)
Triglycerides: 127 mg/dL (ref 0–149)
VLDL Cholesterol Cal: 22 mg/dL (ref 5–40)

## 2021-10-07 NOTE — Patient Instructions (Signed)
Medication Instructions:  No Changes *If you need a refill on your cardiac medications before your next appointment, please call your pharmacy*   Lab Work: CBC, CMET, Lipid Panel. Today If you have labs (blood work) drawn today and your tests are completely normal, you will receive your results only by: [place] (if you have MyChart) OR A paper copy in the mail If you have any lab test that is abnormal or we need to change your treatment, we will call you to review the results.   Testing/Procedures: No Testing   Follow-Up: At [facility], you and your health needs are our priority.  As part of our continuing mission to provide you with exceptional heart care, we have created designated Provider Care Teams.  These Care Teams include your primary Cardiologist (physician) and Advanced Practice Providers (APPs -  Physician Assistants and Nurse Practitioners) who all work together to provide you with the care you need, when you need it.  We recommend signing up for the patient portal called "MyChart".  Sign up information is provided on this After Visit Summary.  MyChart is used to connect with patients for Virtual Visits (Telemedicine).  Patients are able to view lab/test results, encounter notes, upcoming appointments, etc.  Non-urgent messages can be sent to your provider as well.   To learn more about what you can do with MyChart, go to [email or web address].    Your next appointment:   6 month(s)  The format for your next appointment:   In Person  Provider:   [name], MD       Important Information About Sugar

## 2021-10-11 ENCOUNTER — Telehealth: Payer: Self-pay

## 2021-10-11 DIAGNOSIS — E7800 Pure hypercholesterolemia, unspecified: Secondary | ICD-10-CM

## 2021-10-11 MED ORDER — EZETIMIBE 10 MG PO TABS: 10.0000 mg | ORAL_TABLET | Freq: Every day | ORAL | 3 refills | Status: DC

## 2021-10-11 NOTE — Telephone Encounter (Addendum)
Called patient regarding results. Left detailed message for patient in regards to results.----- Message from Lendon Colonel, NP sent at 10/08/2021  9:23 AM EDT ----- I have reviewed labs. Essentially normal. LDL is elevated above the goal of < 70. Especially multiple areas with stents in place. Please add zeta 10 mg daily. Repeat lipids in 6 weeks.   Thank you KL

## 2021-10-20 ENCOUNTER — Ambulatory Visit (HOSPITAL_COMMUNITY)
Admission: RE | Admit: 2021-10-20 | Discharge: 2021-10-20 | Disposition: A | Discharge status: Home / Self Care | Payer: Medicare Other | Source: Ambulatory Visit | Attending: Internal Medicine | Admitting: Internal Medicine

## 2021-10-20 ENCOUNTER — Ambulatory Visit (HOSPITAL_BASED_OUTPATIENT_CLINIC_OR_DEPARTMENT_OTHER)
Admission: RE | Admit: 2021-10-20 | Discharge: 2021-10-20 | Disposition: A | Discharge status: Home / Self Care | Payer: Medicare Other | Source: Ambulatory Visit | Attending: Cardiovascular Disease | Admitting: Cardiovascular Disease

## 2021-10-20 DIAGNOSIS — I6523 Occlusion and stenosis of bilateral carotid arteries: Secondary | ICD-10-CM | POA: Diagnosis not present

## 2021-10-20 DIAGNOSIS — I739 Peripheral vascular disease, unspecified: Secondary | ICD-10-CM

## 2021-10-20 DIAGNOSIS — Z95828 Presence of other vascular implants and grafts: Secondary | ICD-10-CM | POA: Diagnosis not present

## 2021-10-24 ENCOUNTER — Telehealth: Payer: Self-pay | Admitting: Family Medicine

## 2021-10-28 ENCOUNTER — Ambulatory Visit (INDEPENDENT_AMBULATORY_CARE_PROVIDER_SITE_OTHER): Payer: Medicare Other

## 2021-10-28 VITALS — BP 122/62 | HR 83 | Temp 98.2°F | Ht 70.0 in | Wt 185.0 lb

## 2021-10-28 DIAGNOSIS — Z0000 Encounter for general adult medical examination without abnormal findings: Secondary | ICD-10-CM | POA: Diagnosis not present

## 2021-10-28 NOTE — Progress Notes (Signed)
Subjective:   Brady MARAGH Sr. is a 71 y.o. male who presents for Medicare Annual/Subsequent preventive examination.  Review of Systems         Objective:    Today's Vitals   10/28/21 0846  BP: 122/62  Pulse: 83  Temp: 98.2 F (36.8 C)  TempSrc: Oral  SpO2: 93%  Weight: 185 lb (83.9 kg)  Height: '5\' 10"'$  (1.778 m)   Body mass index is 26.54 kg/m.     10/28/2021    9:04 AM 10/31/2020    4:17 PM 10/31/2020    2:31 AM 11/30/2016    8:43 AM 10/30/2016    6:54 AM 03/16/2014    6:24 AM  Advanced Directives  Does Patient Have a Medical Advance Directive? Yes No No No No Yes  Type of Paramedic of [place];Living will     [place];Living will;Advance instruction for mental health treatment  Does patient want to make changes to medical advance directive? No - Patient declined     No - Patient declined  Copy of [place] in Chart? No - copy requested       Would patient like information on creating a medical advance directive?  No - Patient declined No - Patient declined  No - Patient declined     Current Medications (verified) Outpatient Encounter Medications as of 10/28/2021  Medication Sig   albuterol (VENTOLIN HFA) 108 (90 Base) MCG/ACT inhaler INHALE 2 PUFFS INTO THE LUNGS EVERY 6 HOURS AS NEEDED FOR WHEEZING OR SHORTNESS OF BREATH   clopidogrel (PLAVIX) 75 MG tablet TAKE 1 TABLET(75 MG) BY MOUTH DAILY   Coenzyme Q10 (COQ10) 100 MG CAPS Take 100 mg by mouth daily.   diltiazem (CARDIZEM CD) 120 MG 24 hr capsule TAKE 1 CAPSULE(120 MG) BY MOUTH DAILY   diltiazem (CARDIZEM CD) 120 MG 24 hr capsule TAKE 1 CAPSULE(120 MG) BY MOUTH DAILY   ezetimibe (ZETIA) 10 MG tablet Take 1 tablet (10 mg total) by mouth daily.   fexofenadine (ALLEGRA) 180 MG tablet Take 180 mg by mouth daily.    fluticasone (FLONASE) 50 MCG/ACT nasal spray PLACE 1 SPRAY INTO BOTH NOSTRILS DAILY   ipratropium-albuterol (DUONEB) 0.5-2.5 (3) MG/3ML SOLN  Take 3 mLs by nebulization every 6 (six) hours as needed. (Patient taking differently: Take 3 mLs by nebulization every 6 (six) hours as needed (shortness of breath).)   isosorbide mononitrate (IMDUR) 30 MG 24 hr tablet TAKE 1 TABLET(30 MG) BY MOUTH DAILY   olmesartan (BENICAR) 20 MG tablet TAKE 1 TABLET(20 MG) BY MOUTH DAILY   rosuvastatin (CRESTOR) 20 MG tablet Take 1 tablet (20 mg total) by mouth daily. TAKE 1 TABLET(20 MG) BY MOUTH DAILY   sertraline (ZOLOFT) 50 MG tablet Take 1 tablet (50 mg total) by mouth daily.   spironolactone (ALDACTONE) 25 MG tablet Take 1 tablet (25 mg total) by mouth daily.   TRELEGY ELLIPTA 100-62.5-25 MCG/ACT AEPB INHALE 1 PUFF INTO THE LUNGS DAILY   triamcinolone (NASACORT) 55 MCG/ACT AERO nasal inhaler Place 2 sprays into the nose daily as needed (allergies).   No facility-administered encounter medications on file as of 10/28/2021.    Allergies (verified) Amlodipine, Aspirin, Contrast media [iodinated contrast media], Eliquis [apixaban], Erythromycin, Losartan potassium, Penicillins, Simvastatin, and Sulfa antibiotics   History: Past Medical History:  Diagnosis Date   COPD (chronic obstructive pulmonary disease) ([place])    Coronary artery disease    a. s/p RCA stenting back in 2005 with  a known chronically occluded circumflex and normal LV function.   Emphysema lung (H[facility])    GERD (gastroesophageal reflux disease)    Heart attack ([place]) <2005   History of tobacco abuse    Hyperlipidemia    Hypertension    Lung nodule    a. Suspicious for malignancy, undergoing workup.   Obesity    Peripheral arterial disease ([place])    a. history of stent to right common iliac artery 12/15/99 with a peak for balloon-expandable stent. b. s/p intervention on RCIA total occlusion PTA and stent, residual disease on the right for possible staged intervention, notable disease on the left but asymptomatic in 2015. c. 10/2016: s/p PTA and stenting of left common iliac   Past  Surgical History:  Procedure Laterality Date   CARDIAC CATHETERIZATION  10/26/1999   Archie Endo 09/13/2010   Cardiolite study     59% ejection fraction and negative for ischemia   CORONARY ANGIOPLASTY WITH STENT PLACEMENT  2005   [place]us stent placed to his RCA    [place] Right 12/15/1999   a. history of stent to right common iliac artery 12/15/99 with a peak for balloon-expandable stent. b. s/p intervention on RCIA total occlusion PTA and stent, residual disease on the right for possible staged intervention, notable disease on the left but asymptomatic.   ILIAC VEIN ANGIOPLASTY / STENTING Right 03/16/2014   rt common iliac    by dr berry   LAPAROSCOPIC CHOLECYSTECTOMY  1990s   LOWER EXTREMITY ANGIOGRAM N/A 03/16/2014   Procedure: LOWER EXTREMITY ANGIOGRAM;  Surgeon: [name], MD;  Location[facility] C[facility];  Service: Cardiovascular;  Laterality: N/A;   LOWER EXTREMITY ANGIOGRAPHY N/A 10/30/2016   Procedure: Lower Extremity Angiography;  Surgeon: [name], MD;  Location: [facility] CV LAB;  Service: Cardiovascular;  Laterality: N/A;   LOWER EXTREMITY ANGIOGRAPHY N/A 11/30/2016   Procedure: Lower Extremity Angiography;  Surgeon: [name], MD;  Location: [place] CV LAB;  Service: Cardiovascular;  Laterality: N/A;   PERIPHERAL VASCULAR INTERVENTION Left 10/30/2016   Procedure: Peripheral Vascular Intervention;  Surgeon: [name], MD;  Location: [place] CV LAB;  Service: Cardiovascular;  Laterality: Left;  Lt Com ILIAC   TONSILLECTOMY AND ADENOIDECTOMY  2979G   UMBILICAL HERNIA REPAIR  2012   Family History  Problem Relation Age of Onset   Heart disease Father    Asthma Son    Lung cancer Paternal Grandfather    CAD Brother    CAD Brother    Social History   Socioeconomic History   Marital status: Married    Spouse name: Olin Hauser   Number of children: 2   Years of education: Not on file   Highest education level: Not on file   Occupational History   Occupation: textile manf  Tobacco Use   Smoking status: Former    Packs/day: 2.00    Years: 50.00    Total pack years: 100.00    Types: Cigarettes    Quit date: 11/01/2020    Years since quitting: 0.9   Smokeless tobacco: Never   Tobacco comments:    currently smoking 1ppd as of 4/8  Vaping Use   Vaping Use: Never used  Substance and Sexual Activity   Alcohol use: Yes    Comment: 10/30/2016 "might have a few drinks 4-5 times/year"   Drug use: No   Sexual activity: Not Currently  Other Topics Concern   Not on file  Social History Narrative   Not on file   Social Determinants of Health   Financial Resource Strain: Low Risk  (10/28/2021)   Overall Financial Resource Strain (CARDIA)    Difficulty of Paying Living Expenses: Not hard at all  Food Insecurity: No Food Insecurity (10/28/2021)   Hunger Vital Sign    Worried About Running Out of Food in the Last Year: Never true    Ran Out of Food in the Last Year: Never true  Transportation Needs: No Transportation Needs (10/28/2021)   PRAPARE - Hydrologist (Medical): No    Lack of Transportation (Non-Medical): No  Physical Activity: Sufficiently Active (10/28/2021)   Exercise Vital Sign    Days of Exercise per Week: 3 days    Minutes of Exercise per Session: 60 min  Stress: No Stress Concern Present (10/28/2021)   [place]    Feeling of Stress : Not at all  Social Connections: Moderately Isolated (10/28/2021)   Social Connection and Isolation Panel [NHANES]    Frequency of Communication with Friends and Family: More than three times a week    Frequency of Social Gatherings with Friends and Family: More than three times a week    Attends Religious Services: Never    Marine scientist or Organizations: No    Attends Archivist Meetings: Never    Marital Status: Married    Tobacco  Counseling Counseling given: Yes Tobacco comments: currently smoking 1ppd as of 4/8   Clinical Intake:   Diabetic?  No  Interpreter Needed?: NoActivities of Daily Living    10/28/2021    9:00 AM 10/31/2020    4:19 PM  In your present state of health, do you have any difficulty performing the following activities:  Hearing? 0   Vision? 0   Difficulty concentrating or making decisions? 0   Walking or climbing stairs? 0   Dressing or bathing? 0   Doing errands, shopping? 0 0  Preparing Food and eating ? N   Using the Toilet? N   In the past six months, have you accidently leaked urine? Y   Comment Patient stated upon exertion   Do you have problems with loss of bowel control? N   Managing your Medications? N   Managing your Finances? N   Housekeeping or managing your Housekeeping? N     Patient Care Team: Billie Ruddy, MD as PCP - General (Family Medicine) [name], MD as PCP - Cardiology (Cardiology)  Indicate any recent Medical Services you may have received from other than [facility] providers in the past year (date may be approximate).     Assessment:   This is a routine wellness examination for Brady Brooks.  Hearing/Vision screen Hearing Screening - Comments:: No difficulty hearing Vision Screening - Comments:: Wears reading glasses. Followed by Dr Sabra Heck  Dietary issues and exercise activities discussed: Exercise limited by: None identified   Goals Addressed               This Visit's Progress     No current goals (pt-stated)         Depression Screen    10/28/2021    8:58 AM 12/17/2020    3:35 PM 12/26/2016    1:02 PM  PHQ 2/9 Scores  PHQ - 2 Score 0 0 0  PHQ- 9 Score  1     Fall Risk    10/28/2021  9:03 AM 12/02/2018    6:06 PM 12/26/2016    1:02 PM  Fall Risk   Falls in the past year? 0  No  Comment  Emmi Telephone Survey: data to providers prior to load   Number falls in past yr: 0    Comment  Emmi Telephone Survey Actual Response =     Injury with Fall? 0    Risk for fall due to : No Fall Risks      FALL RISK PREVENTION PERTAINING TO THE HOME:  Any stairs in or around the home? Yes  If so, are there any without handrails? No  Home free of loose throw rugs in walkways, pet beds, electrical cords, etc? Yes  Adequate lighting in your home to reduce risk of falls? Yes   ASSISTIVE DEVICES UTILIZED TO PREVENT FALLS:  Life alert? No  Use of a cane, walker or w/c? No  Grab bars in the bathroom? No  Shower chair or bench in shower? No  Elevated toilet seat or a handicapped toilet? Yes   TIMED UP AND GO:  Was the test performed? Yes .  Length of time to ambulate 10 feet: 5 sec.   Gait steady and fast without use of assistive device  Cognitive Function:        10/28/2021    9:05 AM  6CIT Screen  What Year? 4 points  What month? 0 points  What time? 0 points  Count back from 20 0 points  Months in reverse 2 points  Repeat phrase 0 points  Total Score 6 points    Immunizations Immunization History  Administered Date(s) Administered   Fluad Quad(high Dose 65+) 02/12/2020, 02/16/2021   Influenza Split 02/09/2015   Influenza, High Dose Seasonal PF 12/26/2016, 04/10/2018, 02/06/2019   Influenza-Unspecified 02/24/2014, 12/31/2015   PFIZER(Purple Top)SARS-COV-2 Vaccination 05/22/2019, 06/12/2019, 01/29/2020   Pneumococcal Conjugate-13 01/12/2016   Pneumococcal Polysaccharide-23 03/16/2014   Tdap 10/22/2017    TDAP status: Up to date  Flu Vaccine status: Up to date  Pneumococcal vaccine status: Up to date  Covid-19 vaccine status: Completed vaccines  Qualifies for Shingles Vaccine? Yes   Zostavax completed No   Shingrix Completed?: No.    Education has been provided regarding the importance of this vaccine. Patient has been advised to call insurance company to determine out of pocket expense if they have not yet received this vaccine. Advised may also receive vaccine at local pharmacy or Health Dept.  Verbalized acceptance and understanding.  Screening Tests Health Maintenance  Topic Date Due   COVID-19 Vaccine (4 - Booster for Pfizer series) 11/13/2021 (Originally 03/25/2020)   Zoster Vaccines- Shingrix (1 of 2) 01/28/2022 (Originally 12/22/1969)   Pneumonia Vaccine 37+ Years old (3 - PPSV23 if available, else PCV20) 10/29/2022 (Originally 03/17/2019)   COLONOSCOPY (Pts 45-56yr Insurance coverage will need to be confirmed)  10/29/2022 (Originally 04/08/2015)   Hepatitis C Screening  10/29/2022 (Originally 12/22/1968)   INFLUENZA VACCINE  11/29/2021   TETANUS/TDAP  10/23/2027   HPV VACCINES  Aged Out    Health Maintenance  There are no preventive care reminders to display for this patient.   Colorectal cancer screening: Referral to GI placed Patient deferred. Pt aware the office will call re: appt.  Lung Cancer Screening: (Low Dose CT Chest recommended if Age 579-80years, 30 pack-year currently smoking OR have quit w/in 15years.) does qualify.   Lung Cancer Screening Referral: Patient deferred  Additional Screening:  Hepatitis C Screening: does qualify; Completed Patient deferred  Vision Screening: Recommended annual ophthalmology exams for early detection of glaucoma and other disorders of the eye. Is the patient up to date with their annual eye exam?  Yes  Who is the provider or what is the name of the office in which the patient attends annual eye exams? Dr Sabra Heck If pt is not established with a provider, would they like to be referred to a provider to establish care? No .   Dental Screening: Recommended annual dental exams for proper oral hygiene  Community Resource Referral / Chronic Care Management:  CRR required this visit?  No   CCM required this visit?  No      Plan:     I have personally reviewed and noted the following in the patient's chart:   Medical and social history Use of alcohol, tobacco or illicit drugs  Current medications and supplements  including opioid prescriptions. Patient is not currently taking opioid prescriptions. Functional ability and status Nutritional status Physical activity Advanced directives List of other physicians Hospitalizations, surgeries, and ER visits in previous 12 months Vitals Screenings to include cognitive, depression, and falls Referrals and appointments  In addition, I have reviewed and discussed with patient certain preventive protocols, quality metrics, and best practice recommendations. A written personalized care plan for preventive services as well as general preventive health recommendations were provided to patient.     Criselda Peaches, LPN   [id number]   Nurse Notes: None

## 2021-10-28 NOTE — Patient Instructions (Addendum)
Brady Brooks , Thank you for taking time to come for your Medicare Wellness Visit. I appreciate your ongoing commitment to your health goals. Please review the following plan we discussed and let me know if I can assist you in the future.   These are the goals we discussed:  Goals       Blood Pressure < 130/80      LDL CALC < 70      No current goals (pt-stated)        This is a list of the screening recommended for you and due dates:  Health Maintenance  Topic Date Due   COVID-19 Vaccine (4 - Booster for Pfizer series) 11/13/2021*   Zoster (Shingles) Vaccine (1 of 2) 01/28/2022*   Pneumonia Vaccine (3 - PPSV23 if available, else PCV20) 10/29/2022*   Colon Cancer Screening  10/29/2022*   Hepatitis C Screening: USPSTF Recommendation to screen - Ages 18-79 yo.  10/29/2022*   Flu Shot  11/29/2021   Tetanus Vaccine  10/23/2027   HPV Vaccine  Aged Out  *Topic was postponed. The date shown is not the original due date.   Advanced directives: Yes  Conditions/risks identified: None  Next appointment: Follow up in one year for your annual wellness visit.    Preventive Care 71 Years and Older, Male Preventive care refers to lifestyle choices and visits with your health care provider that can promote health and wellness. What does preventive care include? A yearly physical exam. This is also called an annual well check. Dental exams once or twice a year. Routine eye exams. Ask your health care provider how often you should have your eyes checked. Personal lifestyle choices, including: Daily care of your teeth and gums. Regular physical activity. Eating a healthy diet. Avoiding tobacco and drug use. Limiting alcohol use. Practicing safe sex. Taking low doses of aspirin every day. Taking vitamin and mineral supplements as recommended by your health care provider. What happens during an annual well check? The services and screenings done by your health care provider during your  annual well check will depend on your age, overall health, lifestyle risk factors, and family history of disease. Counseling  Your health care provider may ask you questions about your: Alcohol use. Tobacco use. Drug use. Emotional well-being. Home and relationship well-being. Sexual activity. Eating habits. History of falls. Memory and ability to understand (cognition). Work and work Statistician. Screening  You may have the following tests or measurements: Height, weight, and BMI. Blood pressure. Lipid and cholesterol levels. These may be checked every 5 years, or more frequently if you are over 77 years old. Skin check. Lung cancer screening. You may have this screening every year starting at age 54 if you have a 30-pack-year history of smoking and currently smoke or have quit within the past 15 years. Fecal occult blood test (FOBT) of the stool. You may have this test every year starting at age 29. Flexible sigmoidoscopy or colonoscopy. You may have a sigmoidoscopy every 5 years or a colonoscopy every 10 years starting at age 66. Prostate cancer screening. Recommendations will vary depending on your family history and other risks. Hepatitis C blood test. Hepatitis B blood test. Sexually transmitted disease (STD) testing. Diabetes screening. This is done by checking your blood sugar (glucose) after you have not eaten for a while (fasting). You may have this done every 1-3 years. Abdominal aortic aneurysm (AAA) screening. You may need this if you are a current or former smoker. Osteoporosis. You may  be screened starting at age 45 if you are at high risk. Talk with your health care provider about your test results, treatment options, and if necessary, the need for more tests. Vaccines  Your health care provider may recommend certain vaccines, such as: Influenza vaccine. This is recommended every year. Tetanus, diphtheria, and acellular pertussis (Tdap, Td) vaccine. You may need a Td  booster every 10 years. Zoster vaccine. You may need this after age 62. Pneumococcal 13-valent conjugate (PCV13) vaccine. One dose is recommended after age 1. Pneumococcal polysaccharide (PPSV23) vaccine. One dose is recommended after age 56. Talk to your health care provider about which screenings and vaccines you need and how often you need them. This information is not intended to replace advice given to you by your health care provider. Make sure you discuss any questions you have with your health care provider. Document Released: 05/14/2015 Document Revised: 01/05/2016 Document Reviewed: 02/16/2015 Elsevier Interactive Patient Education  2017 [employer] Prevention in the Home Falls can cause injuries. They can happen to people of all ages. There are many things you can do to make your home safe and to help prevent falls. What can I do on the outside of my home? Regularly fix the edges of walkways and driveways and fix any cracks. Remove anything that might make you trip as you walk through a door, such as a raised step or threshold. Trim any bushes or trees on the path to your home. Use bright outdoor lighting. Clear any walking paths of anything that might make someone trip, such as rocks or tools. Regularly check to see if handrails are loose or broken. Make sure that both sides of any steps have handrails. Any raised decks and porches should have guardrails on the edges. Have any leaves, snow, or ice cleared regularly. Use sand or salt on walking paths during winter. Clean up any spills in your garage right away. This includes oil or grease spills. What can I do in the bathroom? Use night lights. Install grab bars by the toilet and in the tub and shower. Do not use towel bars as grab bars. Use non-skid mats or decals in the tub or shower. If you need to sit down in the shower, use a plastic, non-slip stool. Keep the floor dry. Clean up any water that spills on the floor  as soon as it happens. Remove soap buildup in the tub or shower regularly. Attach bath mats securely with double-sided non-slip rug tape. Do not have throw rugs and other things on the floor that can make you trip. What can I do in the bedroom? Use night lights. Make sure that you have a light by your bed that is easy to reach. Do not use any sheets or blankets that are too big for your bed. They should not hang down onto the floor. Have a firm chair that has side arms. You can use this for support while you get dressed. Do not have throw rugs and other things on the floor that can make you trip. What can I do in the kitchen? Clean up any spills right away. Avoid walking on wet floors. Keep items that you use a lot in easy-to-reach places. If you need to reach something above you, use a strong step stool that has a grab bar. Keep electrical cords out of the way. Do not use floor polish or wax that makes floors slippery. If you must use wax, use non-skid floor wax. Do not  have throw rugs and other things on the floor that can make you trip. What can I do with my stairs? Do not leave any items on the stairs. Make sure that there are handrails on both sides of the stairs and use them. Fix handrails that are broken or loose. Make sure that handrails are as long as the stairways. Check any carpeting to make sure that it is firmly attached to the stairs. Fix any carpet that is loose or worn. Avoid having throw rugs at the top or bottom of the stairs. If you do have throw rugs, attach them to the floor with carpet tape. Make sure that you have a light switch at the top of the stairs and the bottom of the stairs. If you do not have them, ask someone to add them for you. What else can I do to help prevent falls? Wear shoes that: Do not have high heels. Have rubber bottoms. Are comfortable and fit you well. Are closed at the toe. Do not wear sandals. If you use a stepladder: Make sure that it is  fully opened. Do not climb a closed stepladder. Make sure that both sides of the stepladder are locked into place. Ask someone to hold it for you, if possible. Clearly mark and make sure that you can see: Any grab bars or handrails. First and last steps. Where the edge of each step is. Use tools that help you move around (mobility aids) if they are needed. These include: Canes. Walkers. Scooters. Crutches. Turn on the lights when you go into a dark area. Replace any light bulbs as soon as they burn out. Set up your furniture so you have a clear path. Avoid moving your furniture around. If any of your floors are uneven, fix them. If there are any pets around you, be aware of where they are. Review your medicines with your doctor. Some medicines can make you feel dizzy. This can increase your chance of falling. Ask your doctor what other things that you can do to help prevent falls. This information is not intended to replace advice given to you by your health care provider. Make sure you discuss any questions you have with your health care provider. Document Released: 02/11/2009 Document Revised: 09/23/2015 Document Reviewed: 05/22/2014 Elsevier Interactive Patient Education  2017 [employer].

## 2021-11-15 ENCOUNTER — Other Ambulatory Visit: Payer: Self-pay | Admitting: Cardiovascular Disease

## 2021-11-15 ENCOUNTER — Other Ambulatory Visit: Payer: Self-pay | Admitting: Physician Assistant

## 2021-11-15 DIAGNOSIS — I739 Peripheral vascular disease, unspecified: Secondary | ICD-10-CM

## 2021-11-15 DIAGNOSIS — I6523 Occlusion and stenosis of bilateral carotid arteries: Secondary | ICD-10-CM

## 2021-12-07 ENCOUNTER — Other Ambulatory Visit: Payer: Self-pay | Admitting: Family Medicine

## 2021-12-07 DIAGNOSIS — F320 Major depressive disorder, single episode, mild: Secondary | ICD-10-CM

## 2021-12-29 DIAGNOSIS — E7800 Pure hypercholesterolemia, unspecified: Secondary | ICD-10-CM | POA: Diagnosis not present

## 2021-12-29 LAB — LIPID PANEL
Chol/HDL Ratio: 2.2 ratio (ref 0.0–5.0)
Cholesterol, Total: 111 mg/dL (ref 100–199)
HDL: 50 mg/dL (ref >39)
LDL Chol Calc (NIH): 41 mg/dL (ref 0–99)
Triglycerides: 108 mg/dL (ref 0–149)
VLDL Cholesterol Cal: 20 mg/dL (ref 5–40)

## 2021-12-29 LAB — HEPATIC FUNCTION PANEL
ALT: 21 IU/L (ref 0–44)
AST: 15 IU/L (ref 0–40)
Albumin: 4.4 g/dL (ref 3.8–4.8)
Alkaline Phosphatase: 62 IU/L (ref 44–121)
Bilirubin Total: 0.4 mg/dL (ref 0.0–1.2)
Bilirubin, Direct: 0.17 mg/dL (ref 0.00–0.40)
Total Protein: 6.4 g/dL (ref 6.0–8.5)

## 2021-12-30 ENCOUNTER — Telehealth: Payer: Self-pay

## 2021-12-30 NOTE — Telephone Encounter (Signed)
Called patient regarding results. Patient had understanding of results. 

## 2022-01-30 DIAGNOSIS — Z23 Encounter for immunization: Secondary | ICD-10-CM | POA: Diagnosis not present

## 2022-02-19 ENCOUNTER — Other Ambulatory Visit: Payer: Self-pay | Admitting: Adult Health

## 2022-03-13 ENCOUNTER — Other Ambulatory Visit: Payer: Self-pay | Admitting: Cardiovascular Disease

## 2022-03-15 ENCOUNTER — Other Ambulatory Visit: Payer: Self-pay | Admitting: Internal Medicine

## 2022-03-31 ENCOUNTER — Ambulatory Visit (INDEPENDENT_AMBULATORY_CARE_PROVIDER_SITE_OTHER): Payer: Medicare Other

## 2022-03-31 ENCOUNTER — Ambulatory Visit (INDEPENDENT_AMBULATORY_CARE_PROVIDER_SITE_OTHER): Payer: Medicare Other | Admitting: Internal Medicine

## 2022-03-31 ENCOUNTER — Encounter: Payer: Self-pay | Admitting: Internal Medicine

## 2022-03-31 VITALS — BP 110/64 | HR 69 | Ht 70.0 in | Wt 175.6 lb

## 2022-03-31 DIAGNOSIS — J449 Chronic obstructive pulmonary disease, unspecified: Secondary | ICD-10-CM | POA: Diagnosis not present

## 2022-03-31 DIAGNOSIS — N393 Stress incontinence (female) (male): Secondary | ICD-10-CM | POA: Diagnosis not present

## 2022-03-31 DIAGNOSIS — J441 Chronic obstructive pulmonary disease with (acute) exacerbation: Secondary | ICD-10-CM

## 2022-03-31 DIAGNOSIS — Z720 Tobacco use: Secondary | ICD-10-CM

## 2022-03-31 DIAGNOSIS — I6523 Occlusion and stenosis of bilateral carotid arteries: Secondary | ICD-10-CM | POA: Diagnosis not present

## 2022-03-31 MED ORDER — TRELEGY ELLIPTA 200-62.5-25 MCG/ACT IN AEPB: 1.0000 | INHALATION_SPRAY | Freq: Every day | RESPIRATORY_TRACT | 0 refills | Status: DC

## 2022-03-31 NOTE — Progress Notes (Signed)
HPI male one pack per day Smoker followed for lung nodules, tobacco abuse, COPD, complicated by CAD, PAD/claudication, HBP, Aspirin Allergy CT chest  03/06/14 2 incidental pulmonary nodules noted in the lungs bilaterally PFT 12/02/14-severe obstructive airways disease with insignificant response to bronchodilator, air trapping, moderately reduced diffusion. FEV1/FVC 0.59, RV 139%, TLC 93%, DLCO 62%.  -----------------------------------------------------------------------------------------------   03/31/21- 71 year old male one pack per day Smoker(100 pk yrs) followed for Lung Nodules, tobacco abuse, COPD, complicated by CAD, PAD/claudication,PAFib/ Eliquis,  HTN, aspirin allergy -Trelegy 100 Nasacort, Ventolin hfa, neb Duoneb Covid  vax-4 Phizer Flu vax- had Hosp July for COPD exacerbation> nicotine patch, -----Follow up for COPD. Using Trelegy daily and albuterol prn. Uses nebulizer daily at night.  Notices that his DuoNeb by nebulizer causes a fine tremor but does not set off his atrial fibrillation which is paroxysmal.  He continues Trelegy and rescue albuterol as before.  Had questions about steroid nasal sprays. CTa chest 11/01/20- IMPRESSION: No definite evidence of pulmonary embolus. Coronary artery calcifications are noted suggesting coronary artery disease. Aortic Atherosclerosis (ICD10-I70.0) and Emphysema (ICD10-J43.9).  03/31/22-71 year old male Smoker(100 pk yrs) followed for Lung Nodules, tobacco abuse, COPD, complicated by CAD, PAD/claudication,PAFib/ Eliquis,  HTN, aspirin allergy -Trelegy 100 Nasacort, Ventolin hfa, neb Duoneb Covid  vax-4 Phizer Flu vax- had -----Pt states acid reflux occurs when coughing x4-41month Still smoking 1/2 pack/day.  Increased cough, postnasal drip, clear mucus no blood.  No chest pain.  Says Trelegy inhaler is a significant help.  Admits some stress incontinence when lifting.   ROS-see HPI     + = positve Constitutional:   No-   weight loss,  night sweats, fevers, chills, fatigue, lassitude. HEENT:   No-  headaches, difficulty swallowing, tooth/dental problems, sore throat,       No-  sneezing, itching, ear ache, nasal congestion, +post nasal drip,  CV:  No-   chest pain, orthopnea, PND, swelling in lower extremities, anasarca,  dizziness, palpitations Resp: +   shortness of breath with exertion or at rest.              No-   productive cough,  + non-productive cough,  No- coughing up of blood.              No-   change in color of mucus.  No- wheezing.   Skin: No-   rash or lesions. GI:  No-   heartburn, indigestion, abdominal pain, nausea, vomiting GU:  MS:  No-   joint pain or swelling.   Neuro-     nothing unusual Psych:  No- change in mood or affect. No depression or anxiety.  No memory loss.  OBJ- Physical Exam General- Alert, Oriented, Affect-appropriate, Distress- none acute,  Skin- rash-none, lesions- none, excoriation- none,  Lymphadenopathy- none Head- atraumatic            Eyes- Gross vision intact, PERRLA, conjunctivae and secretions clear            Ears- Hearing okay,             Nose- +turbinate edema, no-Septal dev, mucus, polyps, erosion, perforation             Throat- Mallampati III , mucosa clear , drainage- none, tonsils- atrophic, own teeth Neck- flexible , trachea midline, no stridor , thyroid nl, carotid no bruit Chest - symmetrical excursion , unlabored           Heart/CV- RRR , no murmur , no gallop  , no rub, nl  s1 s2                           - JVD- none , edema- none, stasis changes- none, varices- none           Lung-  unlabored, ckear/ diminished, wheeze- none, cough-slight , dullness-none, rub- none           Chest wall-  Abd- Br/ Gen/ Rectal- Not done, not indicated Extrem- cyanosis- none, clubbing, none, atrophy- none, strength- nl Neuro- grossly intact to observation

## 2022-03-31 NOTE — Patient Instructions (Addendum)
Order- CXR     dx COPD exacerbation  Order- sample x2   Trelegy 200     inhale 1 puf then rinse mouth well, once daily.  Try this instead of your Trelegy 100.  After the samples are used up, if you think it worked better, let us know and we will change your prescription. Other wise just stay with the Trelegy 100.  Order- refer to [facility]    dx stress incontinence

## 2022-04-01 ENCOUNTER — Other Ambulatory Visit: Payer: Self-pay | Admitting: Adult Health

## 2022-04-05 ENCOUNTER — Encounter: Payer: Self-pay | Admitting: Cardiovascular Disease

## 2022-04-05 ENCOUNTER — Ambulatory Visit: Payer: Medicare Other | Attending: Cardiovascular Disease | Admitting: Cardiovascular Disease

## 2022-04-05 VITALS — BP 110/58 | HR 67 | Ht 70.0 in | Wt 177.8 lb

## 2022-04-05 DIAGNOSIS — I2510 Atherosclerotic heart disease of native coronary artery without angina pectoris: Secondary | ICD-10-CM | POA: Diagnosis not present

## 2022-04-05 DIAGNOSIS — I6523 Occlusion and stenosis of bilateral carotid arteries: Secondary | ICD-10-CM | POA: Diagnosis not present

## 2022-04-05 DIAGNOSIS — Z720 Tobacco use: Secondary | ICD-10-CM | POA: Insufficient documentation

## 2022-04-05 DIAGNOSIS — I739 Peripheral vascular disease, unspecified: Secondary | ICD-10-CM | POA: Insufficient documentation

## 2022-04-05 DIAGNOSIS — I10 Essential (primary) hypertension: Secondary | ICD-10-CM | POA: Diagnosis not present

## 2022-04-05 DIAGNOSIS — E782 Mixed hyperlipidemia: Secondary | ICD-10-CM | POA: Insufficient documentation

## 2022-04-05 NOTE — Assessment & Plan Note (Signed)
History of CAD status post RCA stenting back in 2005 with a chronically occluded circumflex and normal LV function.  He denies chest pain.

## 2022-04-05 NOTE — Assessment & Plan Note (Signed)
History of essential hypertension blood pressure measured today at 110/58.  He is on Cardizem, olmesartan and spironolactone.

## 2022-04-05 NOTE — Assessment & Plan Note (Signed)
History of moderate bilateral ICA stenosis by duplex ultrasound performed 10/20/2021.  This will be repeated on an annual basis.

## 2022-04-05 NOTE — Assessment & Plan Note (Signed)
Ongoing tobacco abuse of 1 pack/day recalcitrant to risk factor modification.

## 2022-04-05 NOTE — Assessment & Plan Note (Signed)
History of PAD status post right common iliac artery stenting by myself 03/16/2014.  He did have a right SFA CTO which I attempted to recanalize unsuccessfully 09/04/2016.  He denies claudication.  His most recent Doppler studies performed 10/20/2021 revealed a patent right common iliac artery stent.  This will be repeated on an annual basis.

## 2022-04-05 NOTE — Progress Notes (Signed)
04/05/2022 Brady Crumb Sr.   [birth year]  [id number]  Primary Physician Billie Ruddy, MD Primary Cardiologist: [name] MD Lupe Carney, [place]  HPI:  Brady Crumb Sr. is a 71 y.o.   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 He was previously a patient of Dr. Durwin Nora Little's and now sees Dr. Claudie Leach. I last saw him in the office 12/31/2020. He has a history of CAD status post RCA stenting back in 2005 with a known chronically occluded circumflex and normal LV function. His cardiac risk factor profile is notable for tobacco abuse and treated hyperlipidemia. He denies chest pain or shortness of breath. I stented his right common iliac artery 12/15/99 with a balloon-expandable stent (8 mm x 2 cm). He had excellent angiographic and clinical results. Over the last 2-3 years she's had progressive claudication in his right hip buttock and leg. Recent workup performed by Dr. Claudie Leach revealed a right ABI of 0.43 with what appeared to be an occluded right common iliac and SFA. A CT scan confirmed iliac occlusion. Since I saw him in the office 01/22/14 he had arterial Doppler studies performed 01/30/14 revealing a right ABI of 0.31 with an occluded right common iliac and SFA. His left ABI was 25 with a high frequency signal in his distal left SFA. He is symptomatic on the right with Rutherford class IV claudication. He also had a nodule on his preoperative chest x-ray which was confirmed to be a 5 x 6 mm millimeter right upper lobe nodule by CT scanning suspicious for malignancy and patient with a history of tobacco abuse. He has an appointment to see Dr. Keturah Barre next month for further evaluation of this. I performed angiography on him 03/16/14 and was able to percutaneously recanalize his right common iliac artery chronic total occlusion and placed a 7 mm x 30 mm long  ICast  covered stent. His right ABI improved from .31-.54 and his right hip claudication has completely resolved. Since I saw him to half years ago he's remained stable. Does continue to smoke a pack a day. He denies chest pain, shortness of breath But does complain of bilateral lower extremity lifestyle including claudication. His Dopplers performed 5/70/18 to decline in his Left ABI From 0.76- 0. 59 with a newly occluded left SFA. I performed left common iliac PTA and stenting on 7/2. He has occluded bilateral SFAs and underwent attempt at right SFA CTO intervention by myself 11/30/16 which was failed and aborted because of inability to cross the proximal cap. His claudication markedly improved with revascularization of his right iliac CTO although he still has mild lifestyle limiting claudication.   Since I saw him in the office a year ago he continues to do well.  He still smoking a pack a day.  He denies claudication, chest pain or shortness of breath.   Current Meds  Medication Sig   albuterol (VENTOLIN HFA) 108 (90 Base) MCG/ACT inhaler INHALE 2 PUFFS INTO THE LUNGS EVERY 6 HOURS AS NEEDED FOR WHEEZING OR SHORTNESS OF BREATH   clopidogrel (PLAVIX) 75 MG tablet TAKE 1 TABLET(75 MG) BY MOUTH DAILY   Coenzyme Q10 (COQ10) 100 MG CAPS Take 100 mg by mouth daily.   diltiazem (CARDIZEM CD) 120 MG 24 hr capsule TAKE 1 CAPSULE(120 MG) BY MOUTH DAILY   diltiazem (CARDIZEM CD) 120 MG 24 hr  capsule TAKE 1 CAPSULE(120 MG) BY MOUTH DAILY   ezetimibe (ZETIA) 10 MG tablet Take 1 tablet (10 mg total) by mouth daily.   fexofenadine (ALLEGRA) 180 MG tablet Take 180 mg by mouth daily.    fluticasone (FLONASE) 50 MCG/ACT nasal spray INHALE 1 SPRAY INTO BOTH NOSTRILS DAILY   Fluticasone-Umeclidin-Vilant (TRELEGY ELLIPTA) 200-62.5-25 MCG/ACT AEPB Inhale 1 puff into the lungs daily at 2 PM.   ipratropium-albuterol (DUONEB) 0.5-2.5 (3) MG/3ML SOLN Take 3 mLs by nebulization every 6 (six) hours as needed. (Patient taking  differently: Take 3 mLs by nebulization every 6 (six) hours as needed (shortness of breath).)   isosorbide mononitrate (IMDUR) 30 MG 24 hr tablet TAKE 1 TABLET(30 MG) BY MOUTH DAILY   olmesartan (BENICAR) 20 MG tablet TAKE 1 TABLET(20 MG) BY MOUTH DAILY   rosuvastatin (CRESTOR) 20 MG tablet TAKE 1 TABLET(20 MG) BY MOUTH DAILY   sertraline (ZOLOFT) 50 MG tablet TAKE 1 TABLET(50 MG) BY MOUTH DAILY   spironolactone (ALDACTONE) 25 MG tablet Take 1 tablet (25 mg total) by mouth daily.   TRELEGY ELLIPTA 100-62.5-25 MCG/ACT AEPB INHALE 1 PUFF INTO THE LUNGS DAILY   triamcinolone (NASACORT) 55 MCG/ACT AERO nasal inhaler Place 2 sprays into the nose daily as needed (allergies).     Allergies  Allergen Reactions   Amlodipine Cough   Aspirin Other (See Comments)    Sneezes, watery nose, wheeze (no polyps)    Contrast Media [Iodinated Contrast Media]     Need to be pre-medicated, sneezing, watery eyes   Eliquis [Apixaban] Nausea And Vomiting   Erythromycin Hives    Childhood allergy All mycin drugs    Losartan Potassium Cough   Penicillins     Childhood allergy Has patient had a PCN reaction causing immediate rash, facial/tongue/throat swelling, SOB or lightheadedness with hypotension: Unknown Has patient had a PCN reaction causing severe rash involving mucus membranes or skin necrosis: Unknown Has patient had a PCN reaction that required hospitalization: No Has patient had a PCN reaction occurring within the last 10 years: No States he has had benadryl when taking this and had no problems recently     Simvastatin Other (See Comments)    Hip pain   Sulfa Antibiotics     unknown    Social History   Socioeconomic History   Marital status: Married    Spouse name: Olin Hauser   Number of children: 2   Years of education: Not on file   Highest education level: Not on file  Occupational History   Occupation: textile manf  Tobacco Use   Smoking status: Former    Packs/day: 2.00    Years:  50.00    Total pack years: 100.00    Types: Cigarettes    Quit date: 11/01/2020    Years since quitting: 1.4   Smokeless tobacco: Never   Tobacco comments:    currently smoking 1ppd as of 4/8  Vaping Use   Vaping Use: Never used  Substance and Sexual Activity   Alcohol use: Yes    Comment: 10/30/2016 "might have a few drinks 4-5 times/year"   Drug use: No   Sexual activity: Not Currently  Other Topics Concern   Not on file  Social History Narrative   Not on file   Social Determinants of Health   Financial Resource Strain: Low Risk  (10/28/2021)   Overall Financial Resource Strain (CARDIA)    Difficulty of Paying Living Expenses: Not hard at all  Food Insecurity: No Food Insecurity (10/28/2021)  Hunger Vital Sign    Worried About Running Out of Food in the Last Year: Never true    Ran Out of Food in the Last Year: Never true  Transportation Needs: No Transportation Needs (10/28/2021)   PRAPARE - Hydrologist (Medical): No    Lack of Transportation (Non-Medical): No  Physical Activity: Sufficiently Active (10/28/2021)   Exercise Vital Sign    Days of Exercise per Week: 3 days    Minutes of Exercise per Session: 60 min  Stress: No Stress Concern Present (10/28/2021)   [place]    Feeling of Stress : Not at all  Social Connections: Moderately Isolated (10/28/2021)   Social Connection and Isolation Panel [NHANES]    Frequency of Communication with Friends and Family: More than three times a week    Frequency of Social Gatherings with Friends and Family: More than three times a week    Attends Religious Services: Never    Marine scientist or Organizations: No    Attends Archivist Meetings: Never    Marital Status: Married  Human resources officer Violence: Not At Risk (10/28/2021)   Humiliation, Afraid, Rape, and Kick questionnaire    Fear of Current or Ex-Partner: No     Emotionally Abused: No    Physically Abused: No    Sexually Abused: No     Review of Systems: General: negative for chills, fever, night sweats or weight changes.  Cardiovascular: negative for chest pain, dyspnea on exertion, edema, orthopnea, palpitations, paroxysmal nocturnal dyspnea or shortness of breath Dermatological: negative for rash Respiratory: negative for cough or wheezing Urologic: negative for hematuria Abdominal: negative for nausea, vomiting, diarrhea, bright red blood per rectum, melena, or hematemesis Neurologic: negative for visual changes, syncope, or dizziness All other systems reviewed and are otherwise negative except as noted above.    Blood pressure (!) 110/58, pulse 67, height '5\' 10"'$  (1.778 m), weight 177 lb 12.8 oz (80.6 kg), SpO2 91 %.  General appearance: alert and no distress Neck: no adenopathy, no JVD, supple, symmetrical, trachea midline, thyroid not enlarged, symmetric, no tenderness/mass/nodules, and left carotid bruit Lungs: clear to auscultation bilaterally Heart: regular rate and rhythm, S1, S2 normal, no murmur, click, rub or gallop Extremities: extremities normal, atraumatic, no cyanosis or edema Pulses: Diminished pedal pulses Skin: Skin color, texture, turgor normal. No rashes or lesions Neurologic: Grossly normal  EKG sinus rhythm at 67 with nonspecific ST and T wave changes.  I personally reviewed this EKG.  ASSESSMENT AND PLAN:   Peripheral arterial disease ([place]) History of PAD status post right common iliac artery stenting by myself 03/16/2014.  He did have a right SFA CTO which I attempted to recanalize unsuccessfully 09/04/2016.  He denies claudication.  His most recent Doppler studies performed 10/20/2021 revealed a patent right common iliac artery stent.  This will be repeated on an annual basis.  Coronary artery disease History of CAD status post RCA stenting back in 2005 with a chronically occluded circumflex and normal LV function.   He denies chest pain.  Hyperlipidemia History of hyperlipidemia on statin therapy with lipid profile performed 12/29/2021 revealing a total cholesterol of 111, LDL 41 and HDL 50.  Tobacco abuse Ongoing tobacco abuse of 1 pack/day recalcitrant to risk factor modification.  Essential hypertension History of essential hypertension blood pressure measured today at 110/58.  He is on Cardizem, olmesartan and spironolactone.  Carotid artery disease ([place]) History of  moderate bilateral ICA stenosis by duplex ultrasound performed 10/20/2021.  This will be repeated on an annual basis.     [name] MD FACP,FACC,FAHA, [facility] 04/05/2022 9:23 AM

## 2022-04-05 NOTE — Assessment & Plan Note (Signed)
History of hyperlipidemia on statin therapy with lipid profile performed 12/29/2021 revealing a total cholesterol of 111, LDL 41 and HDL 50.

## 2022-04-05 NOTE — Patient Instructions (Signed)
Medication Instructions:  Your physician recommends that you continue on your current medications as directed. Please refer to the Current Medication list given to you today.  *If you need a refill on your cardiac medications before your next appointment, please call your pharmacy*   Testing/Procedures: Your physician has requested that you have a carotid duplex. This test is an ultrasound of the carotid arteries in your neck. It looks at blood flow through these arteries that supply the brain with blood. Allow one hour for this exam. There are no restrictions or special instructions. This will take place at [place], [address]. To be done in June 2024.   Your physician has requested that you have an Aorta/Iliac Duplex. This will be take place at [place], [address].  No food after 11PM the night before.  Water is OK. (Don't drink liquids if you have been instructed not to for ANOTHER test) Avoid foods that produce bowel gas, for 24 hours prior to exam (see below). No breakfast, no chewing gum, no smoking or carbonated beverages. Patient may take morning medications with water. Come in for test at least 15 minutes early to register. To be done in June 2024.  Your physician has requested that you have an ankle brachial index (ABI). During this test an ultrasound and blood pressure cuff are used to evaluate the arteries that supply the arms and legs with blood. Allow thirty minutes for this exam. There are no restrictions or special instructions. This will take place at [place], [address].  To be done in June 2024.   Follow-Up: At [facility], you and your health needs are our priority.  As part of our continuing mission to provide you with exceptional heart care, we have created designated Provider Care Teams.  These Care Teams include your primary Cardiologist (physician) and Advanced Practice Providers (APPs -  Physician Assistants and Nurse  Practitioners) who all work together to provide you with the care you need, when you need it.  We recommend signing up for the patient portal called "MyChart".  Sign up information is provided on this After Visit Summary.  MyChart is used to connect with patients for Virtual Visits (Telemedicine).  Patients are able to view lab/test results, encounter notes, upcoming appointments, etc.  Non-urgent messages can be sent to your provider as well.   To learn more about what you can do with MyChart, go to [email or web address].    Your next appointment:   12 month(s)  The format for your next appointment:   In Person  Provider:   [name], MD

## 2022-04-12 ENCOUNTER — Other Ambulatory Visit: Payer: Self-pay | Admitting: Family Medicine

## 2022-04-12 DIAGNOSIS — F320 Major depressive disorder, single episode, mild: Secondary | ICD-10-CM

## 2022-04-19 ENCOUNTER — Other Ambulatory Visit: Payer: Self-pay | Admitting: Internal Medicine

## 2022-04-20 NOTE — Telephone Encounter (Signed)
Attempt to reach pt regarding the follow up appt. Left a voicemail for pt to call us back.

## 2022-05-02 ENCOUNTER — Other Ambulatory Visit (HOSPITAL_COMMUNITY): Payer: Self-pay

## 2022-05-02 ENCOUNTER — Other Ambulatory Visit: Payer: Self-pay | Admitting: Internal Medicine

## 2022-05-02 MED ORDER — TRELEGY ELLIPTA 200-62.5-25 MCG/ACT IN AEPB: 1.0000 | INHALATION_SPRAY | Freq: Every day | RESPIRATORY_TRACT | 5 refills | Status: DC

## 2022-05-03 ENCOUNTER — Encounter: Payer: Self-pay | Admitting: Family Medicine

## 2022-05-03 ENCOUNTER — Ambulatory Visit (INDEPENDENT_AMBULATORY_CARE_PROVIDER_SITE_OTHER): Payer: Medicare Other | Admitting: Family Medicine

## 2022-05-03 ENCOUNTER — Encounter: Payer: Self-pay | Admitting: Internal Medicine

## 2022-05-03 VITALS — BP 100/70 | HR 87 | Temp 98.0°F | Wt 178.2 lb

## 2022-05-03 DIAGNOSIS — R4189 Other symptoms and signs involving cognitive functions and awareness: Secondary | ICD-10-CM | POA: Diagnosis not present

## 2022-05-03 DIAGNOSIS — I10 Essential (primary) hypertension: Secondary | ICD-10-CM

## 2022-05-03 DIAGNOSIS — J449 Chronic obstructive pulmonary disease, unspecified: Secondary | ICD-10-CM

## 2022-05-03 DIAGNOSIS — Z1211 Encounter for screening for malignant neoplasm of colon: Secondary | ICD-10-CM | POA: Diagnosis not present

## 2022-05-03 DIAGNOSIS — F320 Major depressive disorder, single episode, mild: Secondary | ICD-10-CM | POA: Insufficient documentation

## 2022-05-03 DIAGNOSIS — I700 Atherosclerosis of aorta: Secondary | ICD-10-CM | POA: Insufficient documentation

## 2022-05-03 MED ORDER — SERTRALINE HCL 50 MG PO TABS: ORAL_TABLET | ORAL | 1 refills | Status: DC

## 2022-05-03 NOTE — Progress Notes (Signed)
Subjective:    Patient ID: Brady Crumb Sr., male    DOB: 05-15-1950, 72 y.o.   MRN: [id number]  Chief Complaint  Patient presents with   Follow-up    Med check for Zoloft.     HPI Patient is a 72 year old male with pmh sig for PAD s/p R common iliac artery stent, aortic atherosclerosis, CAD s/p RCA stent, HLD, tobacco abuse, HTN was seen today for follow-up.  Patient states he is doing well overall.  Does note intermittent fatigue/brain fog in AM.  Improves as the day goes on.  Patient notes snoring at night.  Recently seen by cardiology.  Doing well on Zoloft 50 mg daily.  Denies issues with appetite, mood, sleep.  Interested in shingles vaccine.  Had colonoscopy done several years ago.  Past Medical History:  Diagnosis Date   COPD (chronic obstructive pulmonary disease) ([place])    Coronary artery disease    a. s/p RCA stenting back in 2005 with a known chronically occluded circumflex and normal LV function.   Emphysema lung (HCC)    GERD (gastroesophageal reflux disease)    Heart attack ([place]) <2005   History of tobacco abuse    Hyperlipidemia    Hypertension    Lung nodule    a. Suspicious for malignancy, undergoing workup.   Obesity    Peripheral arterial disease ([place])    a. history of stent to right common iliac artery 12/15/99 with a peak for balloon-expandable stent. b. s/p intervention on RCIA total occlusion PTA and stent, residual disease on the right for possible staged intervention, notable disease on the left but asymptomatic in 2015. c. 10/2016: s/p PTA and stenting of left common iliac    Allergies  Allergen Reactions   Amlodipine Cough   Aspirin Other (See Comments)    Sneezes, watery nose, wheeze (no polyps)    Contrast Media [Iodinated Contrast Media]     Need to be pre-medicated, sneezing, watery eyes   Eliquis [Apixaban] Nausea And Vomiting   Erythromycin Hives    Childhood allergy All mycin drugs    Losartan Potassium Cough   Penicillins      Childhood allergy Has patient had a PCN reaction causing immediate rash, facial/tongue/throat swelling, SOB or lightheadedness with hypotension: Unknown Has patient had a PCN reaction causing severe rash involving mucus membranes or skin necrosis: Unknown Has patient had a PCN reaction that required hospitalization: No Has patient had a PCN reaction occurring within the last 10 years: No States he has had benadryl when taking this and had no problems recently     Simvastatin Other (See Comments)    Hip pain   Sulfa Antibiotics     unknown    ROS General: Denies fever, chills, night sweats, changes in weight, changes in appetite HEENT: Denies headaches, ear pain, changes in vision, rhinorrhea, sore throat CV: Denies CP, palpitations, SOB, orthopnea Pulm: Denies SOB, cough, wheezing GI: Denies abdominal pain, nausea, vomiting, diarrhea, constipation GU: Denies dysuria, hematuria, frequency Msk: Denies muscle cramps, joint pains Neuro: Denies weakness, numbness, tingling Skin: Denies rashes, bruising Psych: Denies depression, anxiety, hallucinations     Objective:    Blood pressure 100/70, pulse 87, temperature 98 F (36.7 C), temperature source Oral, weight 178 lb 3.2 oz (80.8 kg), SpO2 91 %.  Gen. Pleasant, well-nourished, in no distress, normal affect   HEENT: [place]/AT, face symmetric, conjunctiva clear, no scleral icterus, PERRLA, EOMI, nares patent without drainage Lungs: no accessory muscle use, CTAB, no wheezes or rales  Cardiovascular: RRR, no m/r/g, no peripheral edema Musculoskeletal: No deformities, no cyanosis or clubbing, normal tone Neuro:  A&Ox3, CN II-XII intact, normal gait Skin:  Warm, no lesions/ rash  Wt Readings from Last 3 Encounters:  05/03/22 178 lb 3.2 oz (80.8 kg)  04/05/22 177 lb 12.8 oz (80.6 kg)  03/31/22 175 lb 10.1 oz (79.7 kg)    Lab Results  Component Value Date   WBC 8.5 10/07/2021   HGB 15.7 10/07/2021   HCT 46.5 10/07/2021   PLT 229  10/07/2021   GLUCOSE 165 (H) 10/07/2021   CHOL 111 12/29/2021   TRIG 108 12/29/2021   HDL 50 12/29/2021   LDLCALC 41 12/29/2021   ALT 21 12/29/2021   AST 15 12/29/2021   NA 140 10/07/2021   K 4.6 10/07/2021   CL 101 10/07/2021   CREATININE 0.86 10/07/2021   BUN 14 10/07/2021   CO2 23 10/07/2021   TSH 0.87 09/12/2016   PSA 2.2 09/12/2016   INR 1.0 11/21/2016      05/03/2022    9:44 AM 10/28/2021    8:58 AM 12/17/2020    3:35 PM  Depression screen PHQ 2/9  Decreased Interest 0 0 0  Down, Depressed, Hopeless 0 0 0  PHQ - 2 Score 0 0 0  Altered sleeping 0  0  Tired, decreased energy 0  0  Change in appetite 0  1  Feeling bad or failure about yourself  0  0  Trouble concentrating 0  0  Moving slowly or fidgety/restless 0  0  Suicidal thoughts 0  0  PHQ-9 Score 0  1  Difficult doing work/chores Not difficult at all  Not difficult at all      05/03/2022   10:16 AM 12/17/2020    3:35 PM  GAD 7 : Generalized Anxiety Score  Nervous, Anxious, on Edge 0 1  Control/stop worrying 1 0  Worry too much - different things 0 0  Trouble relaxing 0 0  Restless 0 0  Easily annoyed or irritable 1 1  Afraid - awful might happen 0 0  Total GAD 7 Score 2 2  Anxiety Difficulty Not difficult at all Not difficult at all      Assessment/Plan:  Depression, major, single episode, mild (HCC) -Stable -PHQ 9 score 0 and GAD-7 score 2 this visit -Consider weaning Zoloft.  For now continue Zoloft 50 mg daily  - Plan: sertraline (ZOLOFT) 50 MG tablet  Colon cancer screening  - Plan: Cologuard  Atherosclerosis of aorta (HCC) -Continue Crestor 20 mg daily -Continue lifestyle modifications -Continue follow-up with cardiology  Essential hypertension -Controlled -Continue current medications, per cardiology note 04/05/2022,including Cardizem, olmesartan, and spironolactone.  Patient advised to check for refills as it appears spironolactone may be out. -Continue follow-up with  cardiology  COPD mixed type ([place]) -Stable -Smoking history -Encouraged to consider low-dose CT lung cancer screening -Continue current medications including albuterol, Trelegy  Brain fog -Advised to discuss with pulmonology -Consider sleep study as decreased O2 levels at night may be contributing to symptoms.  F/u in 6 months - 1 year, sooner if needed  Grier Mitts, MD

## 2022-05-03 NOTE — Assessment & Plan Note (Signed)
Still not prepared to make an effective smoking cessation effort. Plan-ongoing counseling

## 2022-05-03 NOTE — Assessment & Plan Note (Addendum)
Will change clinically.  Obvious concern with ongoing smoking. Plan-emphasized smoking cessation, update CXR Try samples Trelegy 200.

## 2022-05-04 MED ORDER — TRELEGY ELLIPTA 100-62.5-25 MCG/ACT IN AEPB: 1.0000 | INHALATION_SPRAY | Freq: Every day | RESPIRATORY_TRACT | 12 refills | Status: DC

## 2022-05-06 ENCOUNTER — Other Ambulatory Visit: Payer: Self-pay | Admitting: Adult Health

## 2022-05-08 MED ORDER — TRELEGY ELLIPTA 200-62.5-25 MCG/ACT IN AEPB: 1.0000 | INHALATION_SPRAY | Freq: Every day | RESPIRATORY_TRACT | 5 refills | Status: DC

## 2022-05-08 NOTE — Addendum Note (Signed)
Addended by: [name] on: 05/08/2022 10:43 AM   Modules accepted: Orders

## 2022-05-09 ENCOUNTER — Other Ambulatory Visit: Payer: Self-pay | Admitting: Internal Medicine

## 2022-05-18 DIAGNOSIS — Z1211 Encounter for screening for malignant neoplasm of colon: Secondary | ICD-10-CM | POA: Diagnosis not present

## 2022-05-31 LAB — COLOGUARD: COLOGUARD: POSITIVE — AB

## 2022-06-28 IMAGING — CT CT CHEST W/O CM
2 of 3 series · 15 of 36 positions shown, 18 images · non-contrast
Comparison: CT chest 10/22/2019

CLINICAL DATA: Follow-up pulmonary nodule.

EXAM:
CT CHEST WITHOUT CONTRAST
TECHNIQUE: Multidetector CT imaging of the chest was performed following the
standard protocol without IV contrast.

[Series 2: thorax · axial · 0.82mm/px · z∈[-171,+135]mm · 12 of 181 slices shown, 15 images]
[im 14/181  mediastinal]
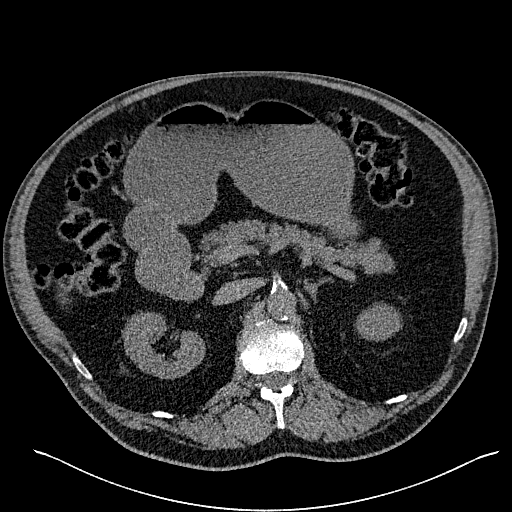
[im 14/181  lung]
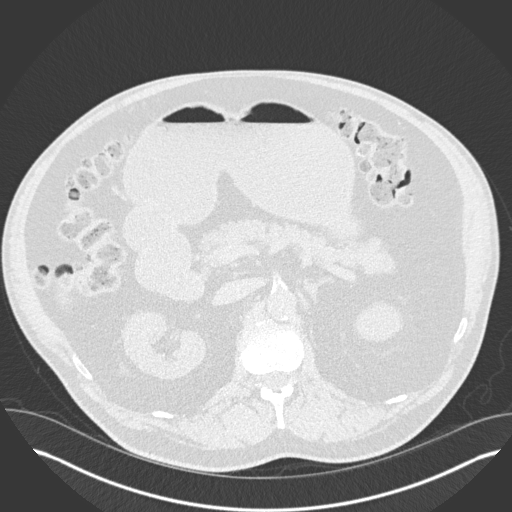
[im 27/181  lung]
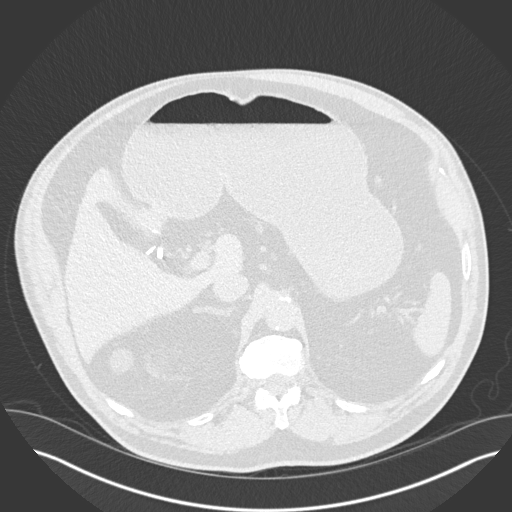
[im 41/181  lung]
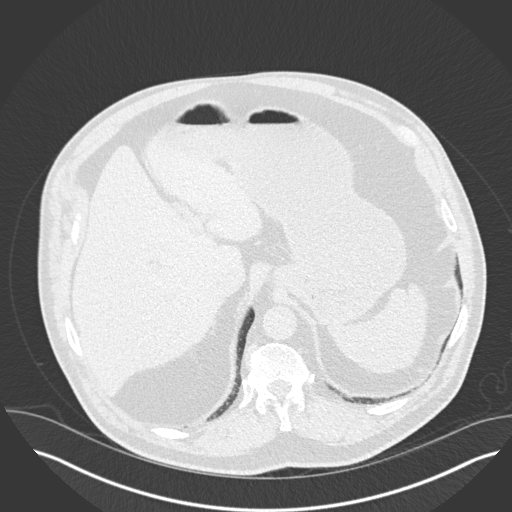
[im 54/181  lung]
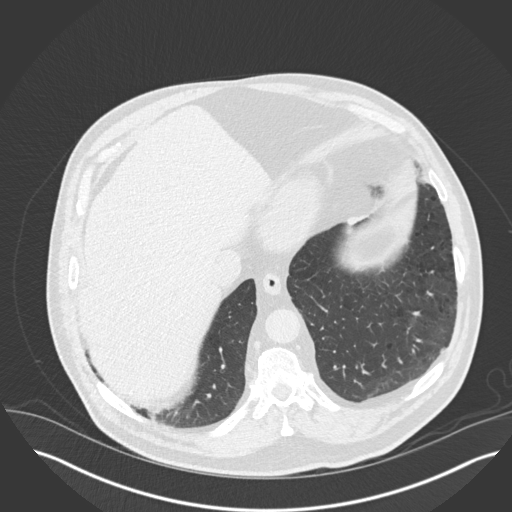
[im 67/181  mediastinal]
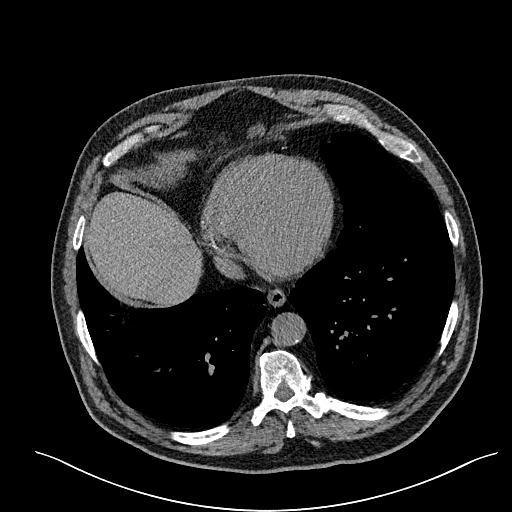
[im 67/181  lung]
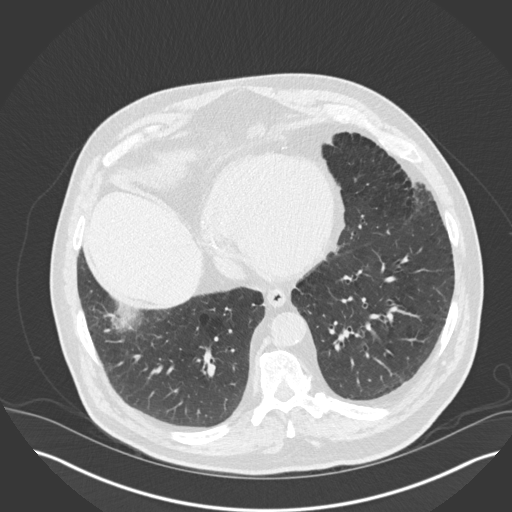
[im 81/181  lung]
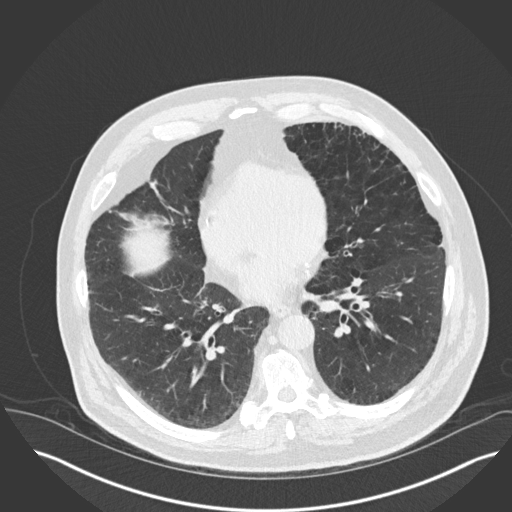
[im 101/181  lung]
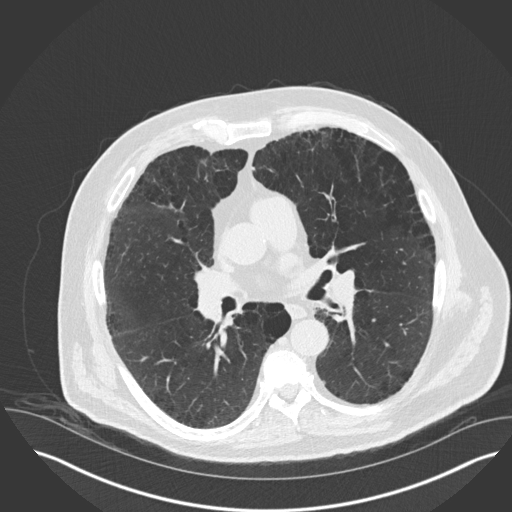
[im 114/181  lung]
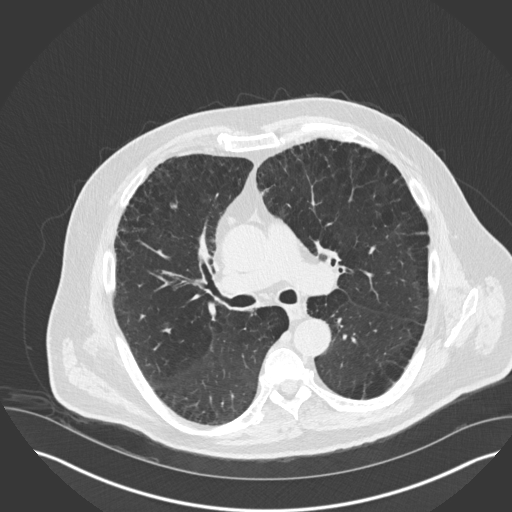
[im 127/181  mediastinal]
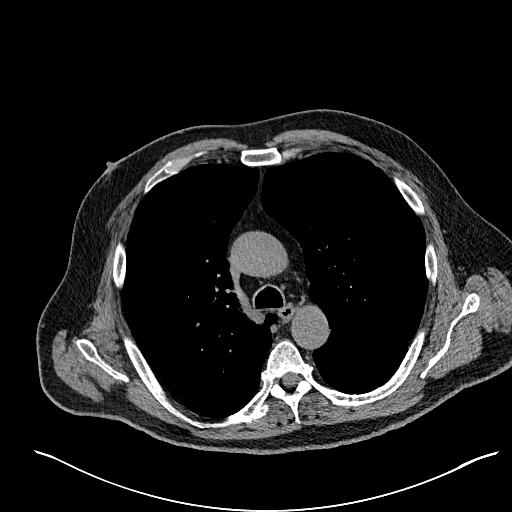
[im 127/181  lung]
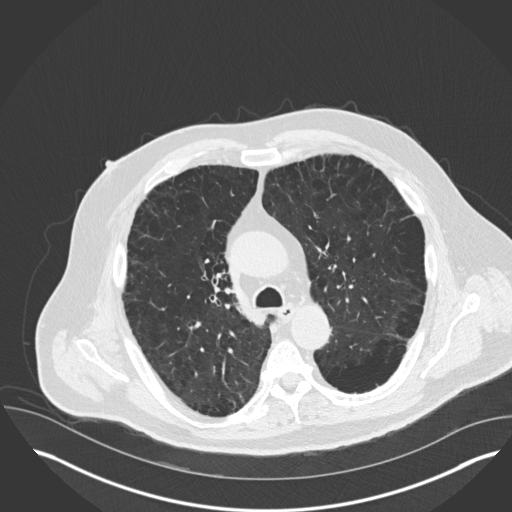
[im 141/181  lung]
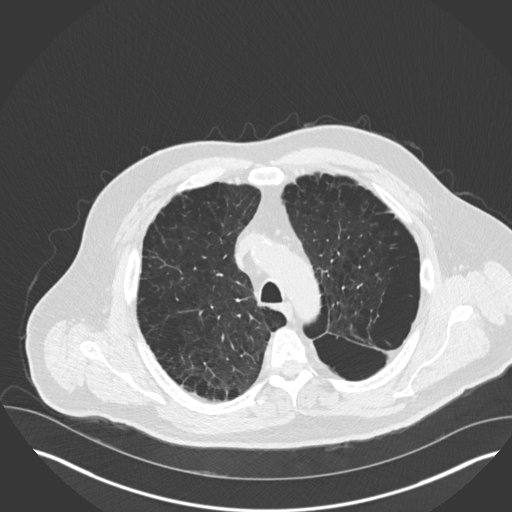
[im 154/181  lung]
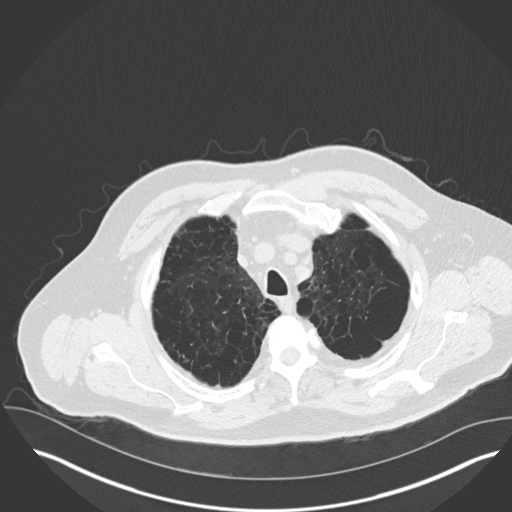
[im 167/181  lung]
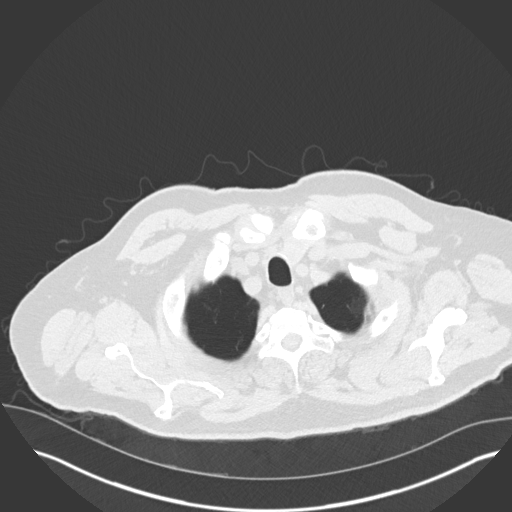

[Series 6: coronal · coronal · 0.75mm/px · 3 of 164 slices shown]
[im 33/164  lung]
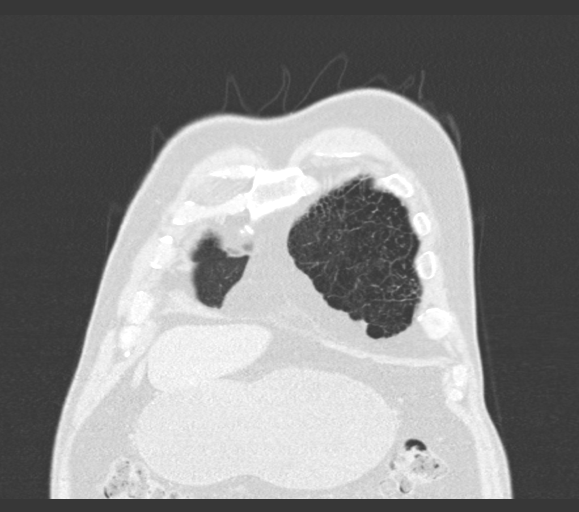
[im 66/164  lung]
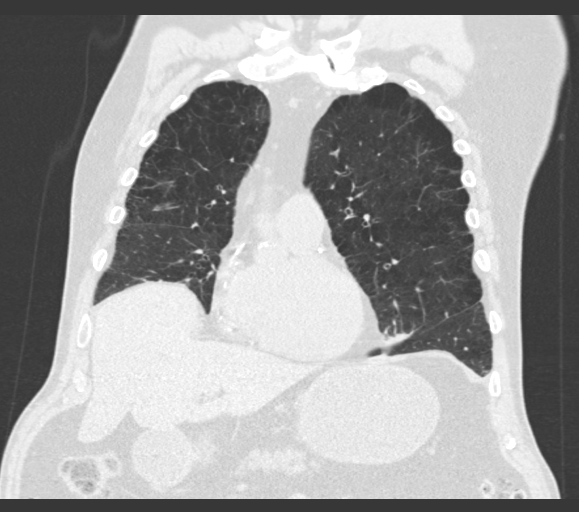
[im 98/164  lung]
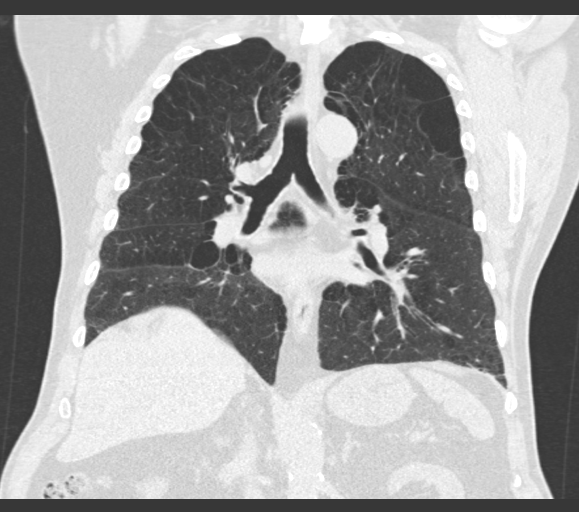

[15 of 36 positions shown; findings below may reference images not displayed]

FINDINGS: Cardiovascular: Aortic atherosclerosis. Coronary artery
calcifications. No pericardial effusion.

Mediastinum/Nodes: Normal appearance of the thyroid gland. The
trachea appears patent and is midline. Normal appearance of the
esophagus. No axillary, supraclavicular, mediastinal, or hilar lymph
nodes.

Lungs/Pleura: Advanced changes of centrilobular and paraseptal
emphysema. No pleural effusion, airspace consolidation or
atelectasis. There has been interval resolution of previous airspace
consolidation in the medial right lower lobe.

Subpleural nodule in the left lower lobe is unchanged measuring 5
mm, image 127/5. Additional small scattered lung nodules are noted
throughout both lobes and are unchanged.

Upper Abdomen: Normal appearance of the adrenal glands. Exophytic
cyst arises off the upper pole of right kidney measuring 2.6 cm.
Aortic atherosclerosis. Previous cholecystectomy

Musculoskeletal: Degenerative disc disease identified within the
thoracic spine. No acute or suspicious osseous findings.
IMPRESSION: 1. Interval resolution of previous right lower lobe airspace
consolidation compatible with a resolved inflammatory/infectious
process.
2. Stable small pulmonary nodules. These have remained stable since
01/07/2016 and are considered benign.
3. Severe changes of centrilobular and paraseptal emphysema.
4. Coronary artery calcifications.

Aortic Atherosclerosis (IP1UI-ZWP.P) and Emphysema (IP1UI-YMR.2).

## 2022-07-05 DIAGNOSIS — I739 Peripheral vascular disease, unspecified: Secondary | ICD-10-CM | POA: Diagnosis not present

## 2022-07-05 DIAGNOSIS — R195 Other fecal abnormalities: Secondary | ICD-10-CM | POA: Diagnosis not present

## 2022-07-05 DIAGNOSIS — Z86010 Personal history of colonic polyps: Secondary | ICD-10-CM | POA: Diagnosis not present

## 2022-07-18 ENCOUNTER — Other Ambulatory Visit: Payer: Self-pay | Admitting: Cardiovascular Disease

## 2022-07-18 DIAGNOSIS — I10 Essential (primary) hypertension: Secondary | ICD-10-CM

## 2022-07-24 ENCOUNTER — Other Ambulatory Visit: Payer: Self-pay | Admitting: Adult Health

## 2022-07-25 DIAGNOSIS — Z1211 Encounter for screening for malignant neoplasm of colon: Secondary | ICD-10-CM | POA: Diagnosis not present

## 2022-07-25 DIAGNOSIS — Z86010 Personal history of colonic polyps: Secondary | ICD-10-CM | POA: Diagnosis not present

## 2022-07-25 LAB — HM COLONOSCOPY

## 2022-07-26 ENCOUNTER — Other Ambulatory Visit: Payer: Self-pay | Admitting: Internal Medicine

## 2022-08-03 DIAGNOSIS — K635 Polyp of colon: Secondary | ICD-10-CM | POA: Diagnosis not present

## 2022-08-17 ENCOUNTER — Other Ambulatory Visit: Payer: Self-pay | Admitting: Cardiovascular Disease

## 2022-08-22 ENCOUNTER — Other Ambulatory Visit: Payer: Self-pay | Admitting: Internal Medicine

## 2022-08-23 ENCOUNTER — Other Ambulatory Visit: Payer: Self-pay | Admitting: Adult Health

## 2022-08-28 DIAGNOSIS — K648 Other hemorrhoids: Secondary | ICD-10-CM | POA: Diagnosis not present

## 2022-08-28 DIAGNOSIS — K5730 Diverticulosis of large intestine without perforation or abscess without bleeding: Secondary | ICD-10-CM | POA: Diagnosis not present

## 2022-08-28 DIAGNOSIS — D126 Benign neoplasm of colon, unspecified: Secondary | ICD-10-CM | POA: Diagnosis not present

## 2022-08-28 DIAGNOSIS — Z6825 Body mass index (BMI) 25.0-25.9, adult: Secondary | ICD-10-CM | POA: Diagnosis not present

## 2022-08-28 DIAGNOSIS — Z9181 History of falling: Secondary | ICD-10-CM | POA: Diagnosis not present

## 2022-09-28 DIAGNOSIS — L03314 Cellulitis of groin: Secondary | ICD-10-CM | POA: Diagnosis not present

## 2022-09-28 DIAGNOSIS — N401 Enlarged prostate with lower urinary tract symptoms: Secondary | ICD-10-CM | POA: Diagnosis not present

## 2022-09-28 DIAGNOSIS — R351 Nocturia: Secondary | ICD-10-CM | POA: Diagnosis not present

## 2022-09-28 DIAGNOSIS — Z125 Encounter for screening for malignant neoplasm of prostate: Secondary | ICD-10-CM | POA: Diagnosis not present

## 2022-09-28 DIAGNOSIS — N3941 Urge incontinence: Secondary | ICD-10-CM | POA: Diagnosis not present

## 2022-09-29 NOTE — Progress Notes (Signed)
HPI male one pack per day Smoker followed for lung nodules, tobacco abuse, COPD, complicated by CAD, PAD/claudication, HBP, Aspirin Allergy CT chest  03/06/14 2 incidental pulmonary nodules noted in the lungs bilaterally PFT 12/02/14-severe obstructive airways disease with insignificant response to bronchodilator, air trapping, moderately reduced diffusion. FEV1/FVC 0.59, RV 139%, TLC 93%, DLCO 62%.  -----------------------------------------------------------------------------------------------   03/31/22-72 year old male Smoker(100 pk yrs) followed for Lung Nodules, tobacco abuse, COPD, complicated by CAD, PAD/claudication,PAFib/ Eliquis,  HTN, aspirin allergy -Trelegy 100 Nasacort, Ventolin hfa, neb Duoneb Covid  vax-4 Phizer Flu vax- had -----Pt states acid reflux occurs when coughing x4-80months Still smoking 1/2 pack/day.  Increased cough, postnasal drip, clear mucus no blood.  No chest pain.  Says Trelegy inhaler is a significant help.  Admits some stress incontinence when lifting.  10/02/22- 72 year old male Smoker(100 pk yrs) followed for Lung Nodules, tobacco abuse, COPD, complicated by CAD, PAD/claudication,PAFib/ Eliquis,  HTN, aspirin allergy, Depression,  -Trelegy 200 Nasacort, Ventolin hfa, neb Duoneb He resumed smoking again. He switched up to Trelegy 200 and says that works well for him.  He complains of some nasal congestion and drainage for which we discussed Flonase and Allegra.  He admits shortness of breath but minimal cough or wheeze.  Again emphasized smoking cessation with offered to help.  He has to decide he is ready. He denies recent cardiac issues. CXR 04/01/22- IMPRESSION: Findings suggest COPD.  Otherwise no active cardiopulmonary disease.   ROS-see HPI     + = positve Constitutional:   No-   weight loss, night sweats, fevers, chills, fatigue, lassitude. HEENT:   No-  headaches, difficulty swallowing, tooth/dental problems, sore throat,       No-  sneezing, itching,  ear ache, nasal congestion, +post nasal drip,  CV:  No-   chest pain, orthopnea, PND, swelling in lower extremities, anasarca,  dizziness, palpitations Resp: +   shortness of breath with exertion or at rest.              No-   productive cough,  + non-productive cough,  No- coughing up of blood.              No-   change in color of mucus.  No- wheezing.   Skin: No-   rash or lesions. GI:  No-   heartburn, indigestion, abdominal pain, nausea, vomiting GU:  MS:  No-   joint pain or swelling.   Neuro-     nothing unusual Psych:  No- change in mood or affect. No depression or anxiety.  No memory loss.  OBJ- Physical Exam General- Alert, Oriented, Affect-appropriate, Distress- none acute,  Skin- rash-none, lesions- none, excoriation- none,  Lymphadenopathy- none Head- atraumatic            Eyes- Gross vision intact, PERRLA, conjunctivae and secretions clear            Ears- Hearing okay,             Nose- +turbinate edema, no-Septal dev, mucus, polyps, erosion, perforation             Throat- Mallampati III , mucosa clear , drainage- none, tonsils- atrophic, own teeth Neck- flexible , trachea midline, no stridor , thyroid nl, carotid no bruit Chest - symmetrical excursion , unlabored           Heart/CV- RRR , no murmur , no gallop  , no rub, nl s1 s2                           -  JVD- none , edema- none, stasis changes- none, varices- none           Lung-  unlabored, clear/ +diminished, wheeze- none, cough-slight , dullness-none, rub- none           Chest wall-  Abd- Br/ Gen/ Rectal- Not done, not indicated Extrem- cyanosis- none, clubbing, none, atrophy- none, strength- nl Neuro- grossly intact to observation

## 2022-10-02 ENCOUNTER — Ambulatory Visit (INDEPENDENT_AMBULATORY_CARE_PROVIDER_SITE_OTHER): Payer: Medicare Other | Admitting: Internal Medicine

## 2022-10-02 ENCOUNTER — Encounter: Payer: Self-pay | Admitting: Internal Medicine

## 2022-10-02 VITALS — BP 100/62 | HR 71 | Temp 98.1°F | Ht 70.0 in | Wt 180.0 lb

## 2022-10-02 DIAGNOSIS — I2510 Atherosclerotic heart disease of native coronary artery without angina pectoris: Secondary | ICD-10-CM | POA: Diagnosis not present

## 2022-10-02 DIAGNOSIS — Z720 Tobacco use: Secondary | ICD-10-CM

## 2022-10-02 DIAGNOSIS — J449 Chronic obstructive pulmonary disease, unspecified: Secondary | ICD-10-CM

## 2022-10-02 NOTE — Assessment & Plan Note (Signed)
We discussed and can continue current meds. Plan-enroll in low-dose screening CT program

## 2022-10-02 NOTE — Assessment & Plan Note (Signed)
Cardiology continues to follow 

## 2022-10-02 NOTE — Patient Instructions (Addendum)
Order- refer to Kandice Robinsons, NP for CT chest low dose screening program  Any cigarette you can skip is progress- keep trying.

## 2022-10-02 NOTE — Assessment & Plan Note (Signed)
Unfortunately he has started smoking again despite counseling and advice.  Support offered.

## 2022-10-26 DIAGNOSIS — R350 Frequency of micturition: Secondary | ICD-10-CM | POA: Diagnosis not present

## 2022-10-26 DIAGNOSIS — R3912 Poor urinary stream: Secondary | ICD-10-CM | POA: Diagnosis not present

## 2022-10-26 DIAGNOSIS — N3941 Urge incontinence: Secondary | ICD-10-CM | POA: Diagnosis not present

## 2022-10-30 ENCOUNTER — Other Ambulatory Visit: Payer: Self-pay | Admitting: Internal Medicine

## 2022-11-03 ENCOUNTER — Other Ambulatory Visit: Payer: Self-pay | Admitting: Cardiovascular Disease

## 2022-11-03 ENCOUNTER — Other Ambulatory Visit: Payer: Self-pay | Admitting: Adult Health

## 2022-11-08 ENCOUNTER — Ambulatory Visit (INDEPENDENT_AMBULATORY_CARE_PROVIDER_SITE_OTHER): Payer: Medicare Other

## 2022-11-08 VITALS — BP 122/62 | HR 80 | Temp 98.2°F | Ht 70.0 in | Wt 177.0 lb

## 2022-11-08 DIAGNOSIS — Z0000 Encounter for general adult medical examination without abnormal findings: Secondary | ICD-10-CM | POA: Diagnosis not present

## 2022-11-08 NOTE — Progress Notes (Signed)
Subjective:   Brady ODONOHUE Sr. is a 72 y.o. male who presents for Medicare Annual/Subsequent preventive examination.  Visit Complete: In person  Patient Medicare AWV questionnaire was completed by the patient on  ; I have confirmed that all information answered by patient is correct and no changes since this date.  Review of Systems      Cardiac Risk Factors include: advanced age (>30men, >64 women);male gender;hypertension     Objective:    Today's Vitals   11/08/22 0815  BP: 122/62  Pulse: 80  Temp: 98.2 F (36.8 C)  TempSrc: Oral  SpO2: 90%  Weight: 177 lb (80.3 kg)  Height: 5\' 10"  (1.778 m)   Body mass index is 25.4 kg/m.     11/08/2022    8:31 AM 10/28/2021    9:04 AM 10/31/2020    4:17 PM 10/31/2020    2:31 AM 11/30/2016    8:43 AM 10/30/2016    6:54 AM 03/16/2014    6:24 AM  Advanced Directives  Does Patient Have a Medical Advance Directive? Yes Yes No No No No Yes  Type of Estate agent of [place];Living will Healthcare Power of [place];Living will     Healthcare Power of [place];Living will;Advance instruction for mental health treatment  Does patient want to make changes to medical advance directive?  No - Patient declined     No - Patient declined  Copy of Healthcare Power of Attorney in Chart? No - copy requested No - copy requested       Would patient like information on creating a medical advance directive?   No - Patient declined No - Patient declined  No - Patient declined     Current Medications (verified) Outpatient Encounter Medications as of 11/08/2022  Medication Sig   albuterol (VENTOLIN HFA) 108 (90 Base) MCG/ACT inhaler INHALE 2 PUFFS INTO THE LUNGS EVERY 6 HOURS AS NEEDED FOR WHEEZING OR SHORTNESS OF BREATH   clopidogrel (PLAVIX) 75 MG tablet TAKE 1 TABLET(75 MG) BY MOUTH DAILY   Coenzyme Q10 (COQ10) 100 MG CAPS Take 100 mg by mouth daily.   diltiazem (CARDIZEM CD) 120 MG 24 hr capsule TAKE 1 CAPSULE(120 MG) BY MOUTH  DAILY   ezetimibe (ZETIA) 10 MG tablet TAKE 1 TABLET(10 MG) BY MOUTH DAILY   fexofenadine (ALLEGRA) 180 MG tablet Take 180 mg by mouth daily.    fluticasone (FLONASE) 50 MCG/ACT nasal spray INHALE 1 SPRAY IN EACH NOSTRIL DAILY   ipratropium-albuterol (DUONEB) 0.5-2.5 (3) MG/3ML SOLN Take 3 mLs by nebulization every 6 (six) hours as needed. (Patient taking differently: Take 3 mLs by nebulization every 6 (six) hours as needed (shortness of breath).)   isosorbide mononitrate (IMDUR) 30 MG 24 hr tablet Take 1 tablet (30 mg total) by mouth daily.   olmesartan (BENICAR) 20 MG tablet TAKE 1 TABLET(20 MG) BY MOUTH DAILY   rosuvastatin (CRESTOR) 20 MG tablet TAKE 1 TABLET(20 MG) BY MOUTH DAILY   sertraline (ZOLOFT) 50 MG tablet TAKE 1 TABLET(50 MG) BY MOUTH DAILY   spironolactone (ALDACTONE) 25 MG tablet Take 1 tablet (25 mg total) by mouth daily.   TRELEGY ELLIPTA 200-62.5-25 MCG/ACT AEPB INHALE 1 PUFF INTO THE LUNGS DAILY   triamcinolone (NASACORT) 55 MCG/ACT AERO nasal inhaler Place 2 sprays into the nose daily as needed (allergies).   No facility-administered encounter medications on file as of 11/08/2022.    Allergies (verified) Amlodipine, Aspirin, Contrast media [iodinated contrast media], Eliquis [apixaban], Erythromycin, Losartan potassium, Penicillins, Simvastatin, and  Sulfa antibiotics   History: Past Medical History:  Diagnosis Date   COPD (chronic obstructive pulmonary disease) (HCC)    Coronary artery disease    a. s/p RCA stenting back in 2005 with a known chronically occluded circumflex and normal LV function.   Emphysema lung (HCC)    GERD (gastroesophageal reflux disease)    Heart attack (HCC) <2005   History of tobacco abuse    Hyperlipidemia    Hypertension    Lung nodule    a. Suspicious for malignancy, undergoing workup.   Obesity    Peripheral arterial disease (HCC)    a. history of stent to right common iliac artery 12/15/99 with a peak for balloon-expandable stent. b.  s/p intervention on RCIA total occlusion PTA and stent, residual disease on the right for possible staged intervention, notable disease on the left but asymptomatic in 2015. c. 10/2016: s/p PTA and stenting of left common iliac   Past Surgical History:  Procedure Laterality Date   CARDIAC CATHETERIZATION  10/26/1999   Hattie Perch 09/13/2010   Cardiolite study     59% ejection fraction and negative for ischemia   CORONARY ANGIOPLASTY WITH STENT PLACEMENT  2005   Taxus stent placed to his RCA    HERNIA REPAIR     ILIAC ARTERY STENT Right 12/15/1999   a. history of stent to right common iliac artery 12/15/99 with a peak for balloon-expandable stent. b. s/p intervention on RCIA total occlusion PTA and stent, residual disease on the right for possible staged intervention, notable disease on the left but asymptomatic.   ILIAC VEIN ANGIOPLASTY / STENTING Right 03/16/2014   rt common iliac    by dr berry   LAPAROSCOPIC CHOLECYSTECTOMY  1990s   LOWER EXTREMITY ANGIOGRAM N/A 03/16/2014   Procedure: LOWER EXTREMITY ANGIOGRAM;  Surgeon: Runell Gess, MD;  Location: [facility] CATH LAB;  Service: Cardiovascular;  Laterality: N/A;   LOWER EXTREMITY ANGIOGRAPHY N/A 10/30/2016   Procedure: Lower Extremity Angiography;  Surgeon: Runell Gess, MD;  Location: [facility] INVASIVE CV LAB;  Service: Cardiovascular;  Laterality: N/A;   LOWER EXTREMITY ANGIOGRAPHY N/A 11/30/2016   Procedure: Lower Extremity Angiography;  Surgeon: Runell Gess, MD;  Location: [facility] INVASIVE CV LAB;  Service: Cardiovascular;  Laterality: N/A;   PERIPHERAL VASCULAR INTERVENTION Left 10/30/2016   Procedure: Peripheral Vascular Intervention;  Surgeon: Runell Gess, MD;  Location: [facility] INVASIVE CV LAB;  Service: Cardiovascular;  Laterality: Left;  Lt Com ILIAC   TONSILLECTOMY AND ADENOIDECTOMY  1950s   UMBILICAL HERNIA REPAIR  2012   Family History  Problem Relation Age of Onset   Heart disease Father    Asthma Son    Lung cancer Paternal  Grandfather    CAD Brother    CAD Brother    Social History   Socioeconomic History   Marital status: Married    Spouse name: Rinaldo Cloud   Number of children: 2   Years of education: Not on file   Highest education level: Not on file  Occupational History   Occupation: textile manf  Tobacco Use   Smoking status: Every Day    Packs/day: 1.00    Years: 50.00    Additional pack years: 0.00    Total pack years: 50.00    Types: Cigarettes   Smokeless tobacco: Never   Tobacco comments:    currently smoking 1ppd as of 4/8  Vaping Use   Vaping Use: Never used  Substance and Sexual Activity   Alcohol use: Yes  Comment: 10/30/2016 "might have a few drinks 4-5 times/year"   Drug use: No   Sexual activity: Not Currently  Other Topics Concern   Not on file  Social History Narrative   Not on file   Social Determinants of Health   Financial Resource Strain: Low Risk  (11/08/2022)   Overall Financial Resource Strain (CARDIA)    Difficulty of Paying Living Expenses: Not hard at all  Food Insecurity: No Food Insecurity (11/08/2022)   Hunger Vital Sign    Worried About Running Out of Food in the Last Year: Never true    Ran Out of Food in the Last Year: Never true  Transportation Needs: No Transportation Needs (11/08/2022)   PRAPARE - Administrator, Civil Service (Medical): No    Lack of Transportation (Non-Medical): No  Physical Activity: Sufficiently Active (10/28/2021)   Exercise Vital Sign    Days of Exercise per Week: 3 days    Minutes of Exercise per Session: 60 min  Stress: No Stress Concern Present (11/08/2022)   [employer] of Occupational Health - Occupational Stress Questionnaire    Feeling of Stress : Not at all  Social Connections: Moderately Isolated (11/08/2022)   Social Connection and Isolation Panel [NHANES]    Frequency of Communication with Friends and Family: More than three times a week    Frequency of Social Gatherings with Friends and Family:  More than three times a week    Attends Religious Services: Never    Database administrator or Organizations: No    Attends Engineer, structural: Never    Marital Status: Married    Tobacco Counseling Ready to quit: No Counseling given: Yes Tobacco comments: currently smoking 1ppd as of 4/8   Clinical Intake:  Information entered by :: Theresa Mulligan LPN Activities of Daily Living    11/08/2022    8:30 AM  In your present state of health, do you have any difficulty performing the following activities:  Hearing? 0  Vision? 0  Difficulty concentrating or making decisions? 0  Walking or climbing stairs? 0  Dressing or bathing? 0  Doing errands, shopping? 0  Preparing Food and eating ? N  Using the Toilet? N  In the past six months, have you accidently leaked urine? Y  Comment Followed by Urologist  Do you have problems with loss of bowel control? N  Managing your Medications? N  Managing your Finances? N  Housekeeping or managing your Housekeeping? N    Patient Care Team: Deeann Saint, MD as PCP - General (Family Medicine) Runell Gess, MD as PCP - Cardiology (Cardiology)  Indicate any recent Medical Services you may have received from other than [facility] providers in the past year (date may be approximate).     Assessment:   This is a routine wellness examination for Brady Brooks.  Hearing/Vision screen Hearing Screening - Comments:: Denies hearing difficulties   Vision Screening - Comments:: Wears reading glasses - up to date with routine eye exams with  [facility]  Dietary issues and exercise activities discussed:     Goals Addressed               This Visit's Progress     Stay healthy (pt-stated)         Depression Screen    11/08/2022    8:15 AM 05/03/2022    9:44 AM 10/28/2021    8:58 AM 12/17/2020    3:35 PM 12/26/2016    1:02 PM  PHQ 2/9 Scores  PHQ - 2 Score 0 0 0 0 0  PHQ- 9 Score  0  1     Fall Risk    11/08/2022    8:31 AM  05/03/2022    9:44 AM 10/28/2021    9:03 AM 12/02/2018    6:06 PM 12/26/2016    1:02 PM  Fall Risk   Falls in the past year? 0 0 0  No  Comment    Emmi Telephone Survey: data to providers prior to load   Number falls in past yr: 0 0 0    Comment    Emmi Telephone Survey Actual Response =    Injury with Fall? 0 0 0    Risk for fall due to : No Fall Risks No Fall Risks No Fall Risks    Follow up Falls prevention discussed Falls evaluation completed       MEDICARE RISK AT HOME:  Medicare Risk at Home - 11/08/22 0834     Any stairs in or around the home? Yes    If so, are there any without handrails? No    Home free of loose throw rugs in walkways, pet beds, electrical cords, etc? Yes    Adequate lighting in your home to reduce risk of falls? Yes    Life alert? No    Use of a cane, walker or w/c? No    Grab bars in the bathroom? No    Shower chair or bench in shower? No    Elevated toilet seat or a handicapped toilet? Yes             TIMED UP AND GO:  Was the test performed?  Yes  Length of time to ambulate 10 feet: 10 sec Gait steady and fast without use of assistive device    Cognitive Function:        11/08/2022    8:31 AM 10/28/2021    9:05 AM  6CIT Screen  What Year? 0 points 4 points  What month? 0 points 0 points  What time? 0 points 0 points  Count back from 20 0 points 0 points  Months in reverse 2 points 2 points  Repeat phrase 0 points 0 points  Total Score 2 points 6 points    Immunizations Immunization History  Administered Date(s) Administered   Fluad Quad(high Dose 65+) 02/12/2020, 02/16/2021   Influenza Split 02/09/2015   Influenza, High Dose Seasonal PF 12/26/2016, 04/10/2018, 02/06/2019   Influenza-Unspecified 02/24/2014, 12/31/2015, 02/12/2022   PFIZER(Purple Top)SARS-COV-2 Vaccination 05/22/2019, 06/12/2019, 01/29/2020   Pneumococcal Conjugate-13 01/12/2016   Pneumococcal Polysaccharide-23 03/16/2014   Tdap 10/22/2017    TDAP status: Up to  date  Flu Vaccine status: Up to date  Pneumococcal vaccine status: Due, Education has been provided regarding the importance of this vaccine. Advised may receive this vaccine at local pharmacy or Health Dept. Aware to provide a copy of the vaccination record if obtained from local pharmacy or Health Dept. Verbalized acceptance and understanding.  Covid-19 vaccine status: Completed vaccines  Qualifies for Shingles Vaccine? Yes   Zostavax completed No   Shingrix Completed?: No.    Education has been provided regarding the importance of this vaccine. Patient has been advised to call insurance company to determine out of pocket expense if they have not yet received this vaccine. Advised may also receive vaccine at local pharmacy or Health Dept. Verbalized acceptance and understanding.  Screening Tests Health Maintenance  Topic Date Due   Hepatitis C  Screening  Never done   COVID-19 Vaccine (4 - 2023-24 season) 11/24/2022 (Originally 12/30/2021)   Zoster Vaccines- Shingrix (1 of 2) 02/08/2023 (Originally 12/22/1969)   Lung Cancer Screening  05/04/2023 (Originally 11/01/2021)   Pneumonia Vaccine 51+ Years old (3 of 3 - PPSV23 or PCV20) 11/08/2023 (Originally 03/17/2019)   INFLUENZA VACCINE  11/30/2022   Medicare Annual Wellness (AWV)  11/08/2023   DTaP/Tdap/Td (2 - Td or Tdap) 10/23/2027   Colonoscopy  07/24/2032   HPV VACCINES  Aged Out    Health Maintenance  Health Maintenance Due  Topic Date Due   Hepatitis C Screening  Never done    Colorectal cancer screening: Type of screening: Colonoscopy. Completed 07/25/22. Repeat every 10 years  Lung Cancer Screening: (Low Dose CT Chest recommended if Age 67-80 years, 20 pack-year currently smoking OR have quit w/in 15years.) does qualify.   Lung Cancer Screening Referral: Ordered on 10/02/22  Additional Screening:  Hepatitis C Screening: does qualify;  Deferred  Vision Screening: Recommended annual ophthalmology exams for early detection of  glaucoma and other disorders of the eye. Is the patient up to date with their annual eye exam?  Yes  Who is the provider or what is the name of the office in which the patient attends annual eye exams? [facility] If pt is not established with a provider, would they like to be referred to a provider to establish care? No .   Dental Screening: Recommended annual dental exams for proper oral hygiene    Community Resource Referral / Chronic Care Management:  CRR required this visit?  No   CCM required this visit?  No     Plan:     I have personally reviewed and noted the following in the patient's chart:   Medical and social history Use of alcohol, tobacco or illicit drugs  Current medications and supplements including opioid prescriptions. Patient is not currently taking opioid prescriptions. Functional ability and status Nutritional status Physical activity Advanced directives List of other physicians Hospitalizations, surgeries, and ER visits in previous 12 months Vitals Screenings to include cognitive, depression, and falls Referrals and appointments  In addition, I have reviewed and discussed with patient certain preventive protocols, quality metrics, and best practice recommendations. A written personalized care plan for preventive services as well as general preventive health recommendations were provided to patient.     Tillie Rung, LPN Given  1/61/0960   After Visit Summary:   Nurse Notes: None

## 2022-11-08 NOTE — Patient Instructions (Addendum)
Brady Brooks , Thank you for taking time to come for your Medicare Wellness Visit. I appreciate your ongoing commitment to your health goals. Please review the following plan we discussed and let me know if I can assist you in the future.   These are the goals we discussed:  Goals       Blood Pressure < 130/80      LDL CALC < 70      No current goals (pt-stated)      Stay healthy (pt-stated)        This is a list of the screening recommended for you and due dates:  Health Maintenance  Topic Date Due   Hepatitis C Screening  Never done   COVID-19 Vaccine (4 - 2023-24 season) 11/24/2022*   Zoster (Shingles) Vaccine (1 of 2) 02/08/2023*   Screening for Lung Cancer  05/04/2023*   Pneumonia Vaccine (3 of 3 - PPSV23 or PCV20) 11/08/2023*   Flu Shot  11/30/2022   Medicare Annual Wellness Visit  11/08/2023   DTaP/Tdap/Td vaccine (2 - Td or Tdap) 10/23/2027   Colon Cancer Screening  07/24/2032   HPV Vaccine  Aged Out  *Topic was postponed. The date shown is not the original due date.    Advanced directives: Please bring a copy of your health care power of attorney and living will to the office to be added to your chart at your convenience.   Conditions/risks identified: None  Next appointment: Follow up in one year for your annual wellness visit.   Preventive Care 72 Years and Older, Male  Preventive care refers to lifestyle choices and visits with your health care provider that can promote health and wellness. What does preventive care include? A yearly physical exam. This is also called an annual well check. Dental exams once or twice a year. Routine eye exams. Ask your health care provider how often you should have your eyes checked. Personal lifestyle choices, including: Daily care of your teeth and gums. Regular physical activity. Eating a healthy diet. Avoiding tobacco and drug use. Limiting alcohol use. Practicing safe sex. Taking low doses of aspirin every day. Taking  vitamin and mineral supplements as recommended by your health care provider. What happens during an annual well check? The services and screenings done by your health care provider during your annual well check will depend on your age, overall health, lifestyle risk factors, and family history of disease. Counseling  Your health care provider may ask you questions about your: Alcohol use. Tobacco use. Drug use. Emotional well-being. Home and relationship well-being. Sexual activity. Eating habits. History of falls. Memory and ability to understand (cognition). Work and work Astronomer. Screening  You may have the following tests or measurements: Height, weight, and BMI. Blood pressure. Lipid and cholesterol levels. These may be checked every 5 years, or more frequently if you are over 80 years old. Skin check. Lung cancer screening. You may have this screening every year starting at age 72 if you have a 30-pack-year history of smoking and currently smoke or have quit within the past 15 years. Fecal occult blood test (FOBT) of the stool. You may have this test every year starting at age 72. Flexible sigmoidoscopy or colonoscopy. You may have a sigmoidoscopy every 5 years or a colonoscopy every 10 years starting at age 72. Prostate cancer screening. Recommendations will vary depending on your family history and other risks. Hepatitis C blood test. Hepatitis B blood test. Sexually transmitted disease (STD) testing. Diabetes screening.  This is done by checking your blood sugar (glucose) after you have not eaten for a while (fasting). You may have this done every 1-3 years. Abdominal aortic aneurysm (AAA) screening. You may need this if you are a current or former smoker. Osteoporosis. You may be screened starting at age 72 if you are at high risk. Talk with your health care provider about your test results, treatment options, and if necessary, the need for more tests. Vaccines  Your  health care provider may recommend certain vaccines, such as: Influenza vaccine. This is recommended every year. Tetanus, diphtheria, and acellular pertussis (Tdap, Td) vaccine. You may need a Td booster every 10 years. Zoster vaccine. You may need this after age 72. Pneumococcal 13-valent conjugate (PCV13) vaccine. One dose is recommended after age 72. Pneumococcal polysaccharide (PPSV23) vaccine. One dose is recommended after age 72. Talk to your health care provider about which screenings and vaccines you need and how often you need them. This information is not intended to replace advice given to you by your health care provider. Make sure you discuss any questions you have with your health care provider. Document Released: 05/14/2015 Document Revised: 01/05/2016 Document Reviewed: 02/16/2015 Elsevier Interactive Patient Education  2017 [employer].  Fall Prevention in the Home Falls can cause injuries. They can happen to people of all ages. There are many things you can do to make your home safe and to help prevent falls. What can I do on the outside of my home? Regularly fix the edges of walkways and driveways and fix any cracks. Remove anything that might make you trip as you walk through a door, such as a raised step or threshold. Trim any bushes or trees on the path to your home. Use bright outdoor lighting. Clear any walking paths of anything that might make someone trip, such as rocks or tools. Regularly check to see if handrails are loose or broken. Make sure that both sides of any steps have handrails. Any raised decks and porches should have guardrails on the edges. Have any leaves, snow, or ice cleared regularly. Use sand or salt on walking paths during winter. Clean up any spills in your garage right away. This includes oil or grease spills. What can I do in the bathroom? Use night lights. Install grab bars by the toilet and in the tub and shower. Do not use towel bars as  grab bars. Use non-skid mats or decals in the tub or shower. If you need to sit down in the shower, use a plastic, non-slip stool. Keep the floor dry. Clean up any water that spills on the floor as soon as it happens. Remove soap buildup in the tub or shower regularly. Attach bath mats securely with double-sided non-slip rug tape. Do not have throw rugs and other things on the floor that can make you trip. What can I do in the bedroom? Use night lights. Make sure that you have a light by your bed that is easy to reach. Do not use any sheets or blankets that are too big for your bed. They should not hang down onto the floor. Have a firm chair that has side arms. You can use this for support while you get dressed. Do not have throw rugs and other things on the floor that can make you trip. What can I do in the kitchen? Clean up any spills right away. Avoid walking on wet floors. Keep items that you use a lot in easy-to-reach places. If  you need to reach something above you, use a strong step stool that has a grab bar. Keep electrical cords out of the way. Do not use floor polish or wax that makes floors slippery. If you must use wax, use non-skid floor wax. Do not have throw rugs and other things on the floor that can make you trip. What can I do with my stairs? Do not leave any items on the stairs. Make sure that there are handrails on both sides of the stairs and use them. Fix handrails that are broken or loose. Make sure that handrails are as long as the stairways. Check any carpeting to make sure that it is firmly attached to the stairs. Fix any carpet that is loose or worn. Avoid having throw rugs at the top or bottom of the stairs. If you do have throw rugs, attach them to the floor with carpet tape. Make sure that you have a light switch at the top of the stairs and the bottom of the stairs. If you do not have them, ask someone to add them for you. What else can I do to help prevent  falls? Wear shoes that: Do not have high heels. Have rubber bottoms. Are comfortable and fit you well. Are closed at the toe. Do not wear sandals. If you use a stepladder: Make sure that it is fully opened. Do not climb a closed stepladder. Make sure that both sides of the stepladder are locked into place. Ask someone to hold it for you, if possible. Clearly mark and make sure that you can see: Any grab bars or handrails. First and last steps. Where the edge of each step is. Use tools that help you move around (mobility aids) if they are needed. These include: Canes. Walkers. Scooters. Crutches. Turn on the lights when you go into a dark area. Replace any light bulbs as soon as they burn out. Set up your furniture so you have a clear path. Avoid moving your furniture around. If any of your floors are uneven, fix them. If there are any pets around you, be aware of where they are. Review your medicines with your doctor. Some medicines can make you feel dizzy. This can increase your chance of falling. Ask your doctor what other things that you can do to help prevent falls. This information is not intended to replace advice given to you by your health care provider. Make sure you discuss any questions you have with your health care provider. Document Released: 02/11/2009 Document Revised: 09/23/2015 Document Reviewed: 05/22/2014 Elsevier Interactive Patient Education  2017 [employer].

## 2022-11-14 ENCOUNTER — Ambulatory Visit (HOSPITAL_BASED_OUTPATIENT_CLINIC_OR_DEPARTMENT_OTHER)
Admission: RE | Admit: 2022-11-14 | Discharge: 2022-11-14 | Disposition: A | Discharge status: Home / Self Care | Payer: Medicare Other | Source: Ambulatory Visit | Attending: Cardiovascular Disease | Admitting: Cardiovascular Disease

## 2022-11-14 ENCOUNTER — Ambulatory Visit (HOSPITAL_COMMUNITY)
Admission: RE | Admit: 2022-11-14 | Discharge: 2022-11-14 | Disposition: A | Discharge status: Home / Self Care | Payer: Medicare Other | Source: Ambulatory Visit | Attending: Cardiovascular Disease | Admitting: Cardiovascular Disease

## 2022-11-14 DIAGNOSIS — Z95820 Peripheral vascular angioplasty status with implants and grafts: Secondary | ICD-10-CM

## 2022-11-14 DIAGNOSIS — I739 Peripheral vascular disease, unspecified: Secondary | ICD-10-CM | POA: Diagnosis not present

## 2022-11-14 DIAGNOSIS — I6523 Occlusion and stenosis of bilateral carotid arteries: Secondary | ICD-10-CM | POA: Diagnosis not present

## 2022-11-16 DIAGNOSIS — R351 Nocturia: Secondary | ICD-10-CM | POA: Diagnosis not present

## 2022-11-16 DIAGNOSIS — N401 Enlarged prostate with lower urinary tract symptoms: Secondary | ICD-10-CM | POA: Diagnosis not present

## 2022-11-16 DIAGNOSIS — R350 Frequency of micturition: Secondary | ICD-10-CM | POA: Diagnosis not present

## 2022-11-16 DIAGNOSIS — R3912 Poor urinary stream: Secondary | ICD-10-CM | POA: Diagnosis not present

## 2022-11-16 DIAGNOSIS — R3915 Urgency of urination: Secondary | ICD-10-CM | POA: Diagnosis not present

## 2022-11-16 LAB — VAS US ABI WITH/WO TBI
Left ABI: 0.67
Right ABI: 0.62

## 2022-11-22 ENCOUNTER — Other Ambulatory Visit: Payer: Self-pay | Admitting: Internal Medicine

## 2022-12-11 ENCOUNTER — Other Ambulatory Visit: Payer: Self-pay | Admitting: *Deleted

## 2022-12-11 DIAGNOSIS — Z122 Encounter for screening for malignant neoplasm of respiratory organs: Secondary | ICD-10-CM

## 2022-12-11 DIAGNOSIS — Z87891 Personal history of nicotine dependence: Secondary | ICD-10-CM

## 2022-12-11 DIAGNOSIS — F17210 Nicotine dependence, cigarettes, uncomplicated: Secondary | ICD-10-CM

## 2022-12-13 ENCOUNTER — Other Ambulatory Visit: Payer: Self-pay | Admitting: Family Medicine

## 2022-12-13 DIAGNOSIS — F320 Major depressive disorder, single episode, mild: Secondary | ICD-10-CM

## 2022-12-19 DIAGNOSIS — R8271 Bacteriuria: Secondary | ICD-10-CM | POA: Diagnosis not present

## 2022-12-19 DIAGNOSIS — N3000 Acute cystitis without hematuria: Secondary | ICD-10-CM | POA: Diagnosis not present

## 2022-12-19 DIAGNOSIS — R351 Nocturia: Secondary | ICD-10-CM | POA: Diagnosis not present

## 2022-12-19 DIAGNOSIS — N401 Enlarged prostate with lower urinary tract symptoms: Secondary | ICD-10-CM | POA: Diagnosis not present

## 2023-01-03 DIAGNOSIS — N3000 Acute cystitis without hematuria: Secondary | ICD-10-CM | POA: Diagnosis not present

## 2023-01-16 ENCOUNTER — Ambulatory Visit (INDEPENDENT_AMBULATORY_CARE_PROVIDER_SITE_OTHER): Payer: Medicare Other | Admitting: Acute Care

## 2023-01-16 ENCOUNTER — Encounter: Payer: Self-pay | Admitting: Acute Care

## 2023-01-16 DIAGNOSIS — F17210 Nicotine dependence, cigarettes, uncomplicated: Secondary | ICD-10-CM

## 2023-01-16 NOTE — Progress Notes (Signed)
Virtual Visit via Telephone Note  I connected with Jarvis Newcomer Sr. on 01/16/23 at  3:30 PM EDT by telephone and verified that I am speaking with the correct person using two identifiers.  Location: Patient:  At home Provider: [address], [place], [place], [address]    I discussed the limitations, risks, security and privacy concerns of performing an evaluation and management service by telephone and the availability of in person appointments. I also discussed with the patient that there may be a patient responsible charge related to this service. The patient expressed understanding and agreed to proceed.     Shared Decision Making Visit Lung Cancer Screening Program [phone])   Eligibility: Age 72 y.o. Pack Years Smoking History Calculation 57 pack year smoking history (# packs/per year x # years smoked) Recent History of coughing up blood  no Unexplained weight loss? no ( >Than 15 pounds within the last 6 months ) Prior History Lung / other cancer no (Diagnosis within the last 5 years already requiring surveillance chest CT Scans). Smoking Status Current Smoker Former Smokers: Years since quit:  NA  Quit Date:  NA  Visit Components: Discussion included one or more decision making aids. yes Discussion included risk/benefits of screening. yes Discussion included potential follow up diagnostic testing for abnormal scans. yes Discussion included meaning and risk of over diagnosis. yes Discussion included meaning and risk of False Positives. yes Discussion included meaning of total radiation exposure. yes  Counseling Included: Importance of adherence to annual lung cancer LDCT screening. yes Impact of comorbidities on ability to participate in the program. yes Ability and willingness to under diagnostic treatment. yes  Smoking Cessation Counseling: Current Smokers:  Discussed importance of smoking cessation. yes Information about tobacco cessation classes and  interventions provided to patient. yes Patient provided with "ticket" for LDCT Scan. yes Symptomatic Patient. no  Counseling NA Diagnosis Code: Tobacco Use Z72.0 Asymptomatic Patient yes  Counseling (Intermediate counseling: > three minutes counseling) L2440 Former Smokers:  Discussed the importance of maintaining cigarette abstinence. yes Diagnosis Code: Personal History of Nicotine Dependence. N02.725 Information about tobacco cessation classes and interventions provided to patient. Yes Patient provided with "ticket" for LDCT Scan. yes Written Order for Lung Cancer Screening with LDCT placed in Epic. Yes (CT Chest Lung Cancer Screening Low Dose W/O CM) [id number] Z12.2-Screening of respiratory organs Z87.891-Personal history of nicotine dependence  I have spent 25 minutes of face to face/ virtual visit   time with  Mr. Whitaker discussing the risks and benefits of lung cancer screening. We viewed / discussed a power point together that explained in detail the above noted topics. We paused at intervals to allow for questions to be asked and answered to ensure understanding.We discussed that the single most powerful action that he can take to decrease his risk of developing lung cancer is to quit smoking. We discussed whether or not he is ready to commit to setting a quit date. We discussed options for tools to aid in quitting smoking including nicotine replacement therapy, non-nicotine medications, support groups, Quit Smart classes, and behavior modification. We discussed that often times setting smaller, more achievable goals, such as eliminating 1 cigarette a day for a week and then 2 cigarettes a day for a week can be helpful in slowly decreasing the number of cigarettes smoked. This allows for a sense of accomplishment as well as providing a clinical benefit. I provided  him  with smoking cessation  information  with contact information for  community resources, classes, free nicotine replacement  therapy, and access to mobile apps, text messaging, and on-line smoking cessation help. I have also provided  him  the office contact information in the event he needs to contact me, or the screening staff. We discussed the time and location of the scan, and that either Abigail Miyamoto RN, Karlton Lemon, RN  or I will call / send a letter with the results within 24-72 hours of receiving them. The patient verbalized understanding of all of  the above and had no further questions upon leaving the office. They have my contact information in the event they have any further questions.  I spent 3-4 minutes counseling on smoking cessation and the health risks of continued tobacco abuse.  I explained to the patient that there has been a high incidence of coronary artery disease noted on these exams. I explained that this is a non-gated exam therefore degree or severity cannot be determined. This patient is on statin therapy. I have asked the patient to follow-up with their PCP regarding any incidental finding of coronary artery disease and management with diet or medication as their PCP  feels is clinically indicated. The patient verbalized understanding of the above and had no further questions upon completion of the visit.      Bevelyn Ngo, NP 01/16/2023

## 2023-01-16 NOTE — Patient Instructions (Signed)

## 2023-01-17 ENCOUNTER — Other Ambulatory Visit (HOSPITAL_COMMUNITY): Payer: Self-pay | Admitting: Cardiovascular Disease

## 2023-01-17 ENCOUNTER — Ambulatory Visit
Admission: RE | Admit: 2023-01-17 | Discharge: 2023-01-17 | Disposition: A | Discharge status: Home / Self Care | Payer: Medicare Other | Source: Ambulatory Visit | Attending: Acute Care | Admitting: Acute Care

## 2023-01-17 DIAGNOSIS — Z122 Encounter for screening for malignant neoplasm of respiratory organs: Secondary | ICD-10-CM

## 2023-01-17 DIAGNOSIS — I739 Peripheral vascular disease, unspecified: Secondary | ICD-10-CM

## 2023-01-17 DIAGNOSIS — F17210 Nicotine dependence, cigarettes, uncomplicated: Secondary | ICD-10-CM

## 2023-01-17 DIAGNOSIS — I6523 Occlusion and stenosis of bilateral carotid arteries: Secondary | ICD-10-CM

## 2023-01-17 DIAGNOSIS — Z87891 Personal history of nicotine dependence: Secondary | ICD-10-CM

## 2023-01-22 ENCOUNTER — Other Ambulatory Visit: Payer: Self-pay | Admitting: Cardiovascular Disease

## 2023-01-31 ENCOUNTER — Telehealth: Payer: Self-pay | Admitting: Acute Care

## 2023-01-31 NOTE — Telephone Encounter (Signed)
Call Report  

## 2023-01-31 NOTE — Telephone Encounter (Signed)
Call report received   IMPRESSION: 1. Nodular areas of architectural distortion in the right middle lobe and inferior segment of the lingula categorized as Lung-RADS 4AS, suspicious, but favored to represent areas of evolving post infectious or inflammatory scarring. Follow up low-dose chest CT without contrast in 3 months (please use the following order, "CT CHEST LCS NODULE FOLLOW-UP W/O CM") is recommended. Alternatively, PET may be considered when there is a solid component 8mm or larger. 2. The "S" modifier above refers to potentially clinically significant non lung cancer related findings. Specifically, there is aortic atherosclerosis, in addition to left main and three-vessel coronary artery disease. Please note that although the presence of coronary artery calcium documents the presence of coronary artery disease, the severity of this disease and any potential stenosis cannot be assessed on this non-gated CT examination. Assessment for potential risk factor modification, dietary therapy or pharmacologic therapy may be warranted, if clinically indicated. 3. Mild diffuse bronchial wall thickening with severe centrilobular and paraseptal emphysema; imaging findings suggestive of underlying COPD. 4. There are calcifications of the aortic valve. Echocardiographic correlation for evaluation of potential valvular dysfunction may be warranted if clinically indicated.

## 2023-02-02 ENCOUNTER — Telehealth: Payer: Self-pay | Admitting: Acute Care

## 2023-02-02 NOTE — Telephone Encounter (Signed)
I have attempted to call the patient with the results of their  Low Dose CT Chest Lung cancer screening scan. There was no answer. I have left a HIPPA compliant VM requesting the patient call the office for the scan results. I included the office contact information in the message. We will await his return call. If no return call we will continue to call until patient is contacted.   Need to see if he has been sick. If he has been sick, we can do a 3 month follow up. If not we will consider a PET scan.  We will discuss with him once he has returned the call. Thanks so much

## 2023-02-07 ENCOUNTER — Telehealth: Payer: Self-pay | Admitting: Acute Care

## 2023-02-07 ENCOUNTER — Other Ambulatory Visit: Payer: Self-pay

## 2023-02-07 DIAGNOSIS — F17210 Nicotine dependence, cigarettes, uncomplicated: Secondary | ICD-10-CM

## 2023-02-07 DIAGNOSIS — R911 Solitary pulmonary nodule: Secondary | ICD-10-CM

## 2023-02-07 NOTE — Telephone Encounter (Signed)
Order placed for 3 months follow up LDCT/nodule and results/plan faxed to PCP

## 2023-02-07 NOTE — Telephone Encounter (Signed)
I have called the patient with the results of his low dose Ct Chest. HIs scan was read as a 4 A, but radiology favor infection/ inflammatory changes.  He states he was sick when he had the scan done. We will do a 3 month follow up due 04/2023. He is followed by cardiology and had an echo 2022. Please fax results to PCP , and let them know plan is for a 3 month follow up.  Thanks so much

## 2023-02-17 ENCOUNTER — Other Ambulatory Visit: Payer: Self-pay | Admitting: Internal Medicine

## 2023-03-12 ENCOUNTER — Other Ambulatory Visit: Payer: Self-pay | Admitting: Family Medicine

## 2023-03-12 DIAGNOSIS — F320 Major depressive disorder, single episode, mild: Secondary | ICD-10-CM

## 2023-03-14 NOTE — Progress Notes (Unsigned)
HPI male one pack per day Smoker followed for lung nodules, tobacco abuse, COPD, complicated by CAD, PAD/claudication, HBP, Aspirin Allergy CT chest  03/06/14 2 incidental pulmonary nodules noted in the lungs bilaterally PFT 12/02/14-severe obstructive airways disease with insignificant response to bronchodilator, air trapping, moderately reduced diffusion. FEV1/FVC 0.59, RV 139%, TLC 93%, DLCO 62%.  ----------------------------------------------------------------------------------------------- 10/02/22- 72 year old male Smoker(100 pk yrs) followed for Lung Nodules, tobacco abuse, COPD, complicated by CAD, PAD/claudication,PAFib/ Eliquis,  HTN, aspirin allergy, Depression,  -Trelegy 200 Nasacort, Ventolin hfa, neb Duoneb He resumed smoking again. He switched up to Trelegy 200 and says that works well for him.  He complains of some nasal congestion and drainage for which we discussed Flonase and Allegra.  He admits shortness of breath but minimal cough or wheeze.  Again emphasized smoking cessation with offered to help.  He has to decide he is ready. He denies recent cardiac issues. CXR 04/01/22- IMPRESSION: Findings suggest COPD.  Otherwise no active cardiopulmonary disease.  03/15/23- 72 year old male Smoker(57 pk yrs) followed for Lung Nodules, tobacco abuse, COPD, complicated by CAD, PAD/claudication,PAFib/ Eliquis,  HTN, aspirin allergy, Depression,  -Trelegy 200, Nasacort, Ventolin hfa, neb Duoneb LOV Groce, NP, 01/10/23- > Smoking cessation counseling, LDCT screening program> scheduled for December.,    ROS-see HPI     + = positve Constitutional:   No-   weight loss, night sweats, fevers, chills, fatigue, lassitude. HEENT:   No-  headaches, difficulty swallowing, tooth/dental problems, sore throat,       No-  sneezing, itching, ear ache, nasal congestion, +post nasal drip,  CV:  No-   chest pain, orthopnea, PND, swelling in lower extremities, anasarca,  dizziness, palpitations Resp: +    shortness of breath with exertion or at rest.              No-   productive cough,  + non-productive cough,  No- coughing up of blood.              No-   change in color of mucus.  No- wheezing.   Skin: No-   rash or lesions. GI:  No-   heartburn, indigestion, abdominal pain, nausea, vomiting GU:  MS:  No-   joint pain or swelling.   Neuro-     nothing unusual Psych:  No- change in mood or affect. No depression or anxiety.  No memory loss.  OBJ- Physical Exam General- Alert, Oriented, Affect-appropriate, Distress- none acute,  Skin- rash-none, lesions- none, excoriation- none,  Lymphadenopathy- none Head- atraumatic            Eyes- Gross vision intact, PERRLA, conjunctivae and secretions clear            Ears- Hearing okay,             Nose- +turbinate edema, no-Septal dev, mucus, polyps, erosion, perforation             Throat- Mallampati III , mucosa clear , drainage- none, tonsils- atrophic, own teeth Neck- flexible , trachea midline, no stridor , thyroid nl, carotid no bruit Chest - symmetrical excursion , unlabored           Heart/CV- RRR , no murmur , no gallop  , no rub, nl s1 s2                           - JVD- none , edema- none, stasis changes- none, varices- none  Lung-  unlabored, clear/ +diminished, wheeze- none, cough-slight , dullness-none, rub- none           Chest wall-  Abd- Br/ Gen/ Rectal- Not done, not indicated Extrem- cyanosis- none, clubbing, none, atrophy- none, strength- nl Neuro- grossly intact to observation

## 2023-03-15 ENCOUNTER — Ambulatory Visit: Payer: Medicare Other | Admitting: Internal Medicine

## 2023-03-15 ENCOUNTER — Encounter: Payer: Self-pay | Admitting: Internal Medicine

## 2023-03-15 VITALS — BP 124/64 | HR 69 | Ht 70.0 in | Wt 176.0 lb

## 2023-03-15 DIAGNOSIS — Z720 Tobacco use: Secondary | ICD-10-CM | POA: Diagnosis not present

## 2023-03-15 DIAGNOSIS — R911 Solitary pulmonary nodule: Secondary | ICD-10-CM | POA: Diagnosis not present

## 2023-03-15 DIAGNOSIS — J449 Chronic obstructive pulmonary disease, unspecified: Secondary | ICD-10-CM

## 2023-03-15 DIAGNOSIS — Z23 Encounter for immunization: Secondary | ICD-10-CM | POA: Diagnosis not present

## 2023-03-15 DIAGNOSIS — J441 Chronic obstructive pulmonary disease with (acute) exacerbation: Secondary | ICD-10-CM

## 2023-03-15 NOTE — Patient Instructions (Addendum)
Order- schedule overnight oximetry on room air-   dx COPD mixed type  Keep working on that smoking!!  Order- Flu vax- senior  Keep appt for the follow-up CT scan in December

## 2023-04-18 ENCOUNTER — Ambulatory Visit
Admission: RE | Admit: 2023-04-18 | Discharge: 2023-04-18 | Disposition: A | Discharge status: Home / Self Care | Payer: Medicare Other | Source: Ambulatory Visit | Attending: Acute Care | Admitting: Acute Care

## 2023-04-18 DIAGNOSIS — F17210 Nicotine dependence, cigarettes, uncomplicated: Secondary | ICD-10-CM

## 2023-04-18 DIAGNOSIS — J439 Emphysema, unspecified: Secondary | ICD-10-CM | POA: Diagnosis not present

## 2023-04-18 DIAGNOSIS — Z720 Tobacco use: Secondary | ICD-10-CM | POA: Diagnosis not present

## 2023-04-18 DIAGNOSIS — I2510 Atherosclerotic heart disease of native coronary artery without angina pectoris: Secondary | ICD-10-CM | POA: Diagnosis not present

## 2023-04-18 DIAGNOSIS — R911 Solitary pulmonary nodule: Secondary | ICD-10-CM

## 2023-04-30 ENCOUNTER — Other Ambulatory Visit: Payer: Self-pay

## 2023-04-30 DIAGNOSIS — Z122 Encounter for screening for malignant neoplasm of respiratory organs: Secondary | ICD-10-CM

## 2023-04-30 DIAGNOSIS — Z87891 Personal history of nicotine dependence: Secondary | ICD-10-CM

## 2023-04-30 DIAGNOSIS — F17210 Nicotine dependence, cigarettes, uncomplicated: Secondary | ICD-10-CM

## 2023-05-01 ENCOUNTER — Other Ambulatory Visit: Payer: Self-pay | Admitting: Internal Medicine

## 2023-05-02 ENCOUNTER — Encounter: Payer: Self-pay | Admitting: Internal Medicine

## 2023-05-02 NOTE — Assessment & Plan Note (Signed)
 Abnormal CT.  Plan continue surveillance for stability, wit pending CT December, 2024

## 2023-05-02 NOTE — Assessment & Plan Note (Signed)
 Severe COPD, minimal cough, s mostly emphysema Plan- emphasis on smoking cessation. Overnight oximetry, Flu vax

## 2023-05-02 NOTE — Assessment & Plan Note (Signed)
 Repeated counseling and offers of support

## 2023-05-29 ENCOUNTER — Other Ambulatory Visit: Payer: Self-pay | Admitting: Cardiovascular Disease

## 2023-05-29 ENCOUNTER — Other Ambulatory Visit: Payer: Self-pay | Admitting: Internal Medicine

## 2023-05-29 ENCOUNTER — Other Ambulatory Visit: Payer: Self-pay | Admitting: Family Medicine

## 2023-05-29 DIAGNOSIS — F320 Major depressive disorder, single episode, mild: Secondary | ICD-10-CM

## 2023-05-30 ENCOUNTER — Other Ambulatory Visit: Payer: Self-pay | Admitting: Cardiovascular Disease

## 2023-06-09 ENCOUNTER — Other Ambulatory Visit: Payer: Self-pay | Admitting: Internal Medicine

## 2023-06-25 ENCOUNTER — Other Ambulatory Visit: Payer: Self-pay | Admitting: Cardiovascular Disease

## 2023-07-19 DIAGNOSIS — H2513 Age-related nuclear cataract, bilateral: Secondary | ICD-10-CM | POA: Diagnosis not present

## 2023-07-25 ENCOUNTER — Other Ambulatory Visit: Payer: Self-pay | Admitting: Cardiovascular Disease

## 2023-08-24 ENCOUNTER — Other Ambulatory Visit: Payer: Self-pay | Admitting: Cardiovascular Disease

## 2023-09-01 ENCOUNTER — Other Ambulatory Visit: Payer: Self-pay | Admitting: Family Medicine

## 2023-09-01 DIAGNOSIS — F320 Major depressive disorder, single episode, mild: Secondary | ICD-10-CM

## 2023-09-07 ENCOUNTER — Other Ambulatory Visit: Payer: Self-pay | Admitting: Internal Medicine

## 2023-09-10 ENCOUNTER — Other Ambulatory Visit: Payer: Self-pay | Admitting: Internal Medicine

## 2023-10-16 ENCOUNTER — Other Ambulatory Visit: Payer: Self-pay | Admitting: Cardiovascular Disease

## 2023-10-27 ENCOUNTER — Other Ambulatory Visit: Payer: Self-pay | Admitting: Cardiovascular Disease

## 2023-11-05 ENCOUNTER — Other Ambulatory Visit: Payer: Self-pay | Admitting: Cardiovascular Disease

## 2023-11-06 ENCOUNTER — Other Ambulatory Visit: Payer: Self-pay | Admitting: Cardiovascular Disease

## 2023-11-12 ENCOUNTER — Other Ambulatory Visit: Payer: Self-pay | Admitting: Cardiovascular Disease

## 2023-11-15 ENCOUNTER — Other Ambulatory Visit: Payer: Self-pay | Admitting: Family Medicine

## 2023-11-15 ENCOUNTER — Other Ambulatory Visit: Payer: Self-pay | Admitting: Cardiovascular Disease

## 2023-11-15 DIAGNOSIS — I10 Essential (primary) hypertension: Secondary | ICD-10-CM

## 2023-11-15 DIAGNOSIS — F320 Major depressive disorder, single episode, mild: Secondary | ICD-10-CM

## 2023-11-15 MED ORDER — DILTIAZEM HCL ER COATED BEADS 120 MG PO CP24: 120.0000 mg | ORAL_CAPSULE | Freq: Every day | ORAL | 0 refills | Status: AC

## 2023-11-15 MED ORDER — OLMESARTAN MEDOXOMIL 20 MG PO TABS
20.0000 mg | ORAL_TABLET | Freq: Every day | ORAL | 0 refills | Status: DC
Start: 2023-11-15 — End: 2023-12-05

## 2023-11-15 MED ORDER — SPIRONOLACTONE 25 MG PO TABS: 25.0000 mg | ORAL_TABLET | Freq: Every day | ORAL | 0 refills | Status: DC

## 2023-11-15 MED ORDER — ROSUVASTATIN CALCIUM 20 MG PO TABS: 20.0000 mg | ORAL_TABLET | Freq: Every day | ORAL | 0 refills | Status: DC

## 2023-11-15 MED ORDER — CLOPIDOGREL BISULFATE 75 MG PO TABS: 75.0000 mg | ORAL_TABLET | Freq: Every day | ORAL | 0 refills | Status: DC

## 2023-11-22 DIAGNOSIS — D1801 Hemangioma of skin and subcutaneous tissue: Secondary | ICD-10-CM | POA: Diagnosis not present

## 2023-11-22 DIAGNOSIS — L720 Epidermal cyst: Secondary | ICD-10-CM | POA: Diagnosis not present

## 2023-12-03 ENCOUNTER — Other Ambulatory Visit: Payer: Self-pay | Admitting: Cardiovascular Disease

## 2023-12-03 DIAGNOSIS — I10 Essential (primary) hypertension: Secondary | ICD-10-CM

## 2023-12-14 ENCOUNTER — Ambulatory Visit (HOSPITAL_BASED_OUTPATIENT_CLINIC_OR_DEPARTMENT_OTHER)
Admission: RE | Admit: 2023-12-14 | Discharge: 2023-12-14 | Disposition: A | Discharge status: Home / Self Care | Source: Ambulatory Visit | Attending: Cardiovascular Disease | Admitting: Cardiovascular Disease

## 2023-12-14 ENCOUNTER — Ambulatory Visit: Payer: Self-pay | Admitting: Cardiovascular Disease

## 2023-12-14 ENCOUNTER — Ambulatory Visit (HOSPITAL_COMMUNITY)
Admission: RE | Admit: 2023-12-14 | Discharge: 2023-12-14 | Disposition: A | Discharge status: Home / Self Care | Source: Ambulatory Visit | Attending: Cardiovascular Disease | Admitting: Cardiovascular Disease

## 2023-12-14 DIAGNOSIS — I739 Peripheral vascular disease, unspecified: Secondary | ICD-10-CM

## 2023-12-14 DIAGNOSIS — I6523 Occlusion and stenosis of bilateral carotid arteries: Secondary | ICD-10-CM

## 2023-12-14 DIAGNOSIS — Z95828 Presence of other vascular implants and grafts: Secondary | ICD-10-CM | POA: Diagnosis not present

## 2023-12-14 LAB — VAS US ABI WITH/WO TBI
Left ABI: 0.54
Right ABI: 0.45

## 2023-12-18 ENCOUNTER — Other Ambulatory Visit: Payer: Self-pay | Admitting: Cardiovascular Disease

## 2023-12-18 DIAGNOSIS — I10 Essential (primary) hypertension: Secondary | ICD-10-CM

## 2023-12-24 ENCOUNTER — Ambulatory Visit: Attending: Cardiovascular Disease | Admitting: Cardiovascular Disease

## 2023-12-24 ENCOUNTER — Encounter: Payer: Self-pay | Admitting: Cardiovascular Disease

## 2023-12-24 VITALS — BP 123/70 | HR 69 | Ht 70.0 in | Wt 173.6 lb

## 2023-12-24 DIAGNOSIS — E782 Mixed hyperlipidemia: Secondary | ICD-10-CM | POA: Insufficient documentation

## 2023-12-24 DIAGNOSIS — I739 Peripheral vascular disease, unspecified: Secondary | ICD-10-CM | POA: Diagnosis not present

## 2023-12-24 DIAGNOSIS — I6523 Occlusion and stenosis of bilateral carotid arteries: Secondary | ICD-10-CM | POA: Diagnosis not present

## 2023-12-24 DIAGNOSIS — I2510 Atherosclerotic heart disease of native coronary artery without angina pectoris: Secondary | ICD-10-CM | POA: Insufficient documentation

## 2023-12-24 DIAGNOSIS — Z720 Tobacco use: Secondary | ICD-10-CM | POA: Diagnosis not present

## 2023-12-24 DIAGNOSIS — I10 Essential (primary) hypertension: Secondary | ICD-10-CM | POA: Diagnosis not present

## 2023-12-24 MED ORDER — SPIRONOLACTONE 25 MG PO TABS: 25.0000 mg | ORAL_TABLET | Freq: Every day | ORAL | 4 refills | Status: AC

## 2023-12-24 MED ORDER — OLMESARTAN MEDOXOMIL 20 MG PO TABS: 20.0000 mg | ORAL_TABLET | Freq: Every day | ORAL | 4 refills | Status: AC

## 2023-12-24 NOTE — Assessment & Plan Note (Addendum)
 History of hyperlipidemia on statin therapy and Zetia  with lipid profile performed 12/29/2021 revealing total cholesterol 111, LDL 41 and HDL 50.  We will repeat a lipid liver profile today.

## 2023-12-24 NOTE — Assessment & Plan Note (Signed)
Ongoing tobacco abuse of 1 pack/day recalcitrant to risk factor modification. 

## 2023-12-24 NOTE — Assessment & Plan Note (Signed)
 History of CAD status post RCA stenting back in 2005 with a known chronically occluded circumflex and normal LV function.  He denies chest pain but does complain of shortness of breath most likely related to over 50 pack years of tobacco abuse.

## 2023-12-24 NOTE — Assessment & Plan Note (Signed)
 History of essential hypertension with blood pressure measured today at 123/70.  He is on olmesartan , diltiazem  and Aldactone .

## 2023-12-24 NOTE — Patient Instructions (Signed)
 Medication Instructions:  Your physician recommends that you continue on your current medications as directed. Please refer to the Current Medication list given to you today.  *If you need a refill on your cardiac medications before your next appointment, please call your pharmacy*  Lab Work: Your physician recommends that you have labs drawn today: Lipid/liver panel  If you have labs (blood work) drawn today and your tests are completely normal, you will receive your results only by: MyChart Message (if you have MyChart) OR A paper copy in the mail If you have any lab test that is abnormal or we need to change your treatment, we will call you to review the results.  Testing/Procedures: Your physician has requested that you have an Aorta/Iliac Duplex. This will take place at [address], 4th floor  No food after 11PM the night before.  Water is OK. (Don't drink liquids if you have been instructed not to for ANOTHER test) Avoid foods that produce bowel gas, for 24 hours prior to exam (see below). No breakfast, no chewing gum, no smoking or carbonated beverages. Patient may take morning medications with water. Come in for test at least 15 minutes early to register. **To do in August 2026**  Please note: We ask at that you not bring children with you during ultrasound (echo/ vascular) testing. Due to room size and safety concerns, children are not allowed in the ultrasound rooms during exams. Our front office staff cannot provide observation of children in our lobby area while testing is being conducted. An adult accompanying a patient to their appointment will only be allowed in the ultrasound room at the discretion of the ultrasound technician under special circumstances. We apologize for any inconvenience.  Your physician has requested that you have an ankle brachial index (ABI). During this test an ultrasound and blood pressure cuff are used to evaluate the arteries that supply the  arms and legs with blood. Allow thirty minutes for this exam. There are no restrictions or special instructions. This will take place at [address] 4th floor  **To do in August 2026**   Please note: We ask at that you not bring children with you during ultrasound (echo/ vascular) testing. Due to room size and safety concerns, children are not allowed in the ultrasound rooms during exams. Our front office staff cannot provide observation of children in our lobby area while testing is being conducted. An adult accompanying a patient to their appointment will only be allowed in the ultrasound room at the discretion of the ultrasound technician under special circumstances. We apologize for any inconvenience.  Your physician has requested that you have a carotid duplex. This test is an ultrasound of the carotid arteries in your neck. It looks at blood flow through these arteries that supply the brain with blood. Allow one hour for this exam. There are no restrictions or special instructions. This will take place at [address] 4th floor  **To do in August 2026**  Please note: We ask at that you not bring children with you during ultrasound (echo/ vascular) testing. Due to room size and safety concerns, children are not allowed in the ultrasound rooms during exams. Our front office staff cannot provide observation of children in our lobby area while testing is being conducted. An adult accompanying a patient to their appointment will only be allowed in the ultrasound room at the discretion of the ultrasound technician under special circumstances. We apologize for any inconvenience.   Follow-Up: At  [facility], you and your health needs are our priority.  As part of our continuing mission to provide you with exceptional heart care, our providers are all part of one team.  This team includes your primary Cardiologist (physician) and Advanced Practice Providers or APPs (Physician Assistants  and Nurse Practitioners) who all work together to provide you with the care you need, when you need it.  Your next appointment:   12 month(s)  Provider:   Dorn Lesches, MD    We recommend signing up for the patient portal called MyChart.  Sign up information is provided on this After Visit Summary.  MyChart is used to connect with patients for Virtual Visits (Telemedicine).  Patients are able to view lab/test results, encounter notes, upcoming appointments, etc.  Non-urgent messages can be sent to your provider as well.   To learn more about what you can do with MyChart, go to [email or web address].

## 2023-12-24 NOTE — Progress Notes (Signed)
 12/24/2023 Brady JONETTA Dooms Sr.   1951/04/06  [id number]  Primary Physician Mercer Clotilda SAUNDERS, MD Primary Cardiologist: Brady JINNY Lesches MD [facility] [place], [place]  HPI:  Brady JONETTA Dooms Sr. is a 73 y.o.   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 He was previously a patient of Dr. Daria Little's and now sees Dr. Licia. I last saw him in the office 04/05/2022. He has a history of CAD status post RCA stenting back in 2005 with a known chronically occluded circumflex and normal LV function. His cardiac risk factor profile is notable for tobacco abuse and treated hyperlipidemia. He denies chest pain or shortness of breath. I stented his right common iliac artery 12/15/99 with a balloon-expandable stent (8 mm x 2 cm). He had excellent angiographic and clinical results. Over the last 2-3 years she's had progressive claudication in his right hip buttock and leg. Recent workup performed by Dr. Licia revealed a right ABI of 0.43 with what appeared to be an occluded right common iliac and SFA. A CT scan confirmed iliac occlusion. Since I saw him in the office 01/22/14 he had arterial Doppler studies performed 01/30/14 revealing a right ABI of 0.31 with an occluded right common iliac and SFA. His left ABI was 25 with a high frequency signal in his distal left SFA. He is symptomatic on the right with Rutherford class IV claudication. He also had a nodule on his preoperative chest x-ray which was confirmed to be a 5 x 6 mm millimeter right upper lobe nodule by CT scanning suspicious for malignancy and patient with a history of tobacco abuse. He has an appointment to see Dr. Quita Salt next month for further evaluation of this. I performed angiography on him 03/16/14 and was able to percutaneously recanalize his right common iliac artery chronic total occlusion and placed a 7 mm x 30 mm long  ICast  covered stent. His right ABI improved from .31-.54 and his right hip claudication has completely resolved. Since I saw him to half years ago he's remained stable. Does continue to smoke a pack a day. He denies chest pain, shortness of breath But does complain of bilateral lower extremity lifestyle including claudication. His Dopplers performed 5/70/18 to decline in his Left ABI From 0.76- 0. 59 with a newly occluded left SFA. I performed left common iliac PTA and stenting on 7/2. He has occluded bilateral SFAs and underwent attempt at right SFA CTO intervention by myself 11/30/16 which was failed and aborted because of inability to cross the proximal cap. His claudication markedly improved with revascularization of his right iliac CTO although he still has mild lifestyle limiting claudication.   Since I saw him in the office a year and a half ago he is remained stable.  He does have less energy that he has had in the past.  He does complain of some dyspnea on exertion which is likely was related to his COPD.  He denies chest pain or claudication.   Current Meds  Medication Sig   albuterol  (VENTOLIN  HFA) 108 (90 Base) MCG/ACT inhaler INHALE 2 PUFFS INTO THE LUNGS EVERY 6 HOURS AS NEEDED FOR WHEEZING OR SHORTNESS OF BREATH   clopidogrel  (PLAVIX ) 75 MG tablet TAKE 1 TABLET BY MOUTH DAILY. OVERDUE APPOINTMENT- MUST SCHEDULE APPOINTMENT FOR ADDITIONAL FILLS.   Coenzyme Q10 (COQ10) 100 MG CAPS Take 100 mg by  mouth daily.   diltiazem  (CARDIZEM  CD) 120 MG 24 hr capsule Take 1 capsule (120 mg total) by mouth daily.   ezetimibe  (ZETIA ) 10 MG tablet TAKE 1 TABLET(10 MG) BY MOUTH DAILY   fexofenadine (ALLEGRA) 180 MG tablet Take 180 mg by mouth daily.    fluticasone  (FLONASE ) 50 MCG/ACT nasal spray INHALE 1 SPRAY INTO EACH NOSTRIL EVERY DAY   Fluticasone -Umeclidin-Vilant (TRELEGY ELLIPTA ) 200-62.5-25 MCG/ACT AEPB INHALE 1 PUFF INTO THE LUNGS DAILY   ipratropium-albuterol  (DUONEB) 0.5-2.5 (3) MG/3ML SOLN Take  3 mLs by nebulization every 6 (six) hours as needed. (Patient taking differently: Take 3 mLs by nebulization every 6 (six) hours as needed (shortness of breath).)   isosorbide  mononitrate (IMDUR ) 30 MG 24 hr tablet TAKE 1 TABLET(30 MG) BY MOUTH DAILY. MAKE AN APPOINTMENT IN ORDER TO RECEIVE FUTURE REFILLS.2 ND ATTEMPT   rosuvastatin  (CRESTOR ) 20 MG tablet Take 1 tablet (20 mg total) by mouth daily.   sertraline  (ZOLOFT ) 50 MG tablet TAKE 1 TABLET(50 MG) BY MOUTH DAILY   tamsulosin (FLOMAX) 0.4 MG CAPS capsule Take by mouth daily at 6 (six) AM.   triamcinolone (NASACORT) 55 MCG/ACT AERO nasal inhaler Place 2 sprays into the nose daily as needed (allergies).   [DISCONTINUED] olmesartan  (BENICAR ) 20 MG tablet TAKE 1 TABLET BY MOUTH DAILY. OVERDUE APPOINTMENT- MUST SCHEDULE APPOINTMENT FOR ADDITIONAL REFILLS.   [DISCONTINUED] spironolactone  (ALDACTONE ) 25 MG tablet TAKE 1 TABLET(25 MG) BY MOUTH DAILY     Allergies  Allergen Reactions   Amlodipine  Cough   Aspirin  Other (See Comments)    Sneezes, watery nose, wheeze (no polyps)    Contrast Media [Iodinated Contrast Media]     Need to be pre-medicated, sneezing, watery eyes   Eliquis  [Apixaban ] Nausea And Vomiting   Erythromycin Hives    Childhood allergy All mycin drugs    Losartan  Potassium Cough   Penicillins     Childhood allergy Has patient had a PCN reaction causing immediate rash, facial/tongue/throat swelling, SOB or lightheadedness with hypotension: Unknown Has patient had a PCN reaction causing severe rash involving mucus membranes or skin necrosis: Unknown Has patient had a PCN reaction that required hospitalization: No Has patient had a PCN reaction occurring within the last 10 years: No States he has had benadryl  when taking this and had no problems recently     Simvastatin  Other (See Comments)    Hip pain   Sulfa Antibiotics     unknown    Social History   Socioeconomic History   Marital status: Married    Spouse name:  Sharlet   Number of children: 2   Years of education: Not on file   Highest education level: Not on file  Occupational History   Occupation: textile manf  Tobacco Use   Smoking status: Every Day    Current packs/day: 1.00    Average packs/day: 1 pack/day for 57.0 years (57.0 ttl pk-yrs)    Types: Cigarettes   Smokeless tobacco: Never   Tobacco comments:    currently smoking 1ppd as of 4/8  Vaping Use   Vaping status: Never Used  Substance and Sexual Activity   Alcohol use: Yes    Comment: 10/30/2016 might have a few drinks 4-5 times/year   Drug use: No   Sexual activity: Not Currently  Other Topics Concern   Not on file  Social History Narrative   Not on file   Social Drivers of Health   Financial Resource Strain: Low Risk  (11/08/2022)   Overall Financial Resource  Strain (CARDIA)    Difficulty of Paying Living Expenses: Not hard at all  Food Insecurity: No Food Insecurity (11/08/2022)   Hunger Vital Sign    Worried About Running Out of Food in the Last Year: Never true    Ran Out of Food in the Last Year: Never true  Transportation Needs: No Transportation Needs (11/08/2022)   PRAPARE - Administrator, Civil Service (Medical): No    Lack of Transportation (Non-Medical): No  Physical Activity: Sufficiently Active (10/28/2021)   Exercise Vital Sign    Days of Exercise per Week: 3 days    Minutes of Exercise per Session: 60 min  Stress: No Stress Concern Present (11/08/2022)   [employer] of Occupational Health - Occupational Stress Questionnaire    Feeling of Stress : Not at all  Social Connections: Moderately Isolated (11/08/2022)   Social Connection and Isolation Panel    Frequency of Communication with Friends and Family: More than three times a week    Frequency of Social Gatherings with Friends and Family: More than three times a week    Attends Religious Services: Never    Database administrator or Organizations: No    Attends Banker  Meetings: Never    Marital Status: Married  Catering manager Violence: Not At Risk (11/08/2022)   Humiliation, Afraid, Rape, and Kick questionnaire    Fear of Current or Ex-Partner: No    Emotionally Abused: No    Physically Abused: No    Sexually Abused: No     Review of Systems: General: negative for chills, fever, night sweats or weight changes.  Cardiovascular: negative for chest pain, dyspnea on exertion, edema, orthopnea, palpitations, paroxysmal nocturnal dyspnea or shortness of breath Dermatological: negative for rash Respiratory: negative for cough or wheezing Urologic: negative for hematuria Abdominal: negative for nausea, vomiting, diarrhea, bright red blood per rectum, melena, or hematemesis Neurologic: negative for visual changes, syncope, or dizziness All other systems reviewed and are otherwise negative except as noted above.    Blood pressure 123/70, pulse 69, height 5' 10 (1.778 m), weight 173 lb 9.6 oz (78.7 kg), SpO2 95%.  General appearance: alert and no distress Neck: no adenopathy, no JVD, supple, symmetrical, trachea midline, thyroid  not enlarged, symmetric, no tenderness/mass/nodules, and soft left carotid Zykira Matlack Lungs: clear to auscultation bilaterally Heart: regular rate and rhythm, S1, S2 normal, no murmur, click, rub or gallop Extremities: extremities normal, atraumatic, no cyanosis or edema Pulses: Diminished pedal pulses Skin: Skin color, texture, turgor normal. No rashes or lesions Neurologic: Grossly normal  EKG EKG Interpretation Date/Time:  Monday December 24 2023 09:05:56 EDT Ventricular Rate:  69 PR Interval:  162 QRS Duration:  96 QT Interval:  408 QTC Calculation: 437 R Axis:   57  Text Interpretation: Normal sinus rhythm Possible Inferior infarct , age undetermined When compared with ECG of 31-Oct-2020 20:44, Premature ventricular complexes are no longer Present Nonspecific T wave abnormality now evident in Inferior leads Confirmed by  Court Carrier [phone]) on 12/24/2023 9:23:18 AM    ASSESSMENT AND PLAN:   Peripheral arterial disease (HCC) History of peripheral arterial disease status post right common iliac artery stenting by myself 8/16/1 with an 8 mm x 2 cm balloon expandable stent.  He did have a occluded SFAs bilaterally.  I repeat angiogram him 03/16/2014 I was able to percutaneously recanalize his right common iliac artery CTO and placed a 7 mm x 30 mm long iCAST covered stent.  I did bring  him back for angiography 11/30/2016 and tried to cross his right SFA CTO unsuccessfully because of inability to cross the proximal.  He currently denies claudication.  His Dopplers do show a decrease in his right ABI from 0.62-0.45 and of his left ABI from 0.67-0.54.  His Dopplers do show monophasic waveforms bilaterally with velocities in the right common iliac artery of 281.  Coronary artery disease History of CAD status post RCA stenting back in 2005 with a known chronically occluded circumflex and normal LV function.  He denies chest pain but does complain of shortness of breath most likely related to over 50 pack years of tobacco abuse.  Hyperlipidemia History of hyperlipidemia on statin therapy and Zetia  with lipid profile performed 12/29/2021 revealing total cholesterol 111, LDL 41 and HDL 50.  We will repeat a lipid liver profile today.  Tobacco abuse Ongoing tobacco abuse of 1 pack/day recalcitrant to risk factor modification.  Essential hypertension History of essential hypertension with blood pressure measured today at 123/70.  He is on olmesartan , diltiazem  and Aldactone .  Carotid artery disease (HCC) History of carotid artery disease with carotid Dopplers performed 12/14/2023 revealing moderate bilateral ICA stenosis which has demonstrated progression over time.  We will repeat this in 1 year.     Brady DOROTHA Lesches MD FACP,FACC,FAHA, FSCAI 12/24/2023 9:32 AM

## 2023-12-24 NOTE — Assessment & Plan Note (Signed)
 History of carotid artery disease with carotid Dopplers performed 12/14/2023 revealing moderate bilateral ICA stenosis which has demonstrated progression over time.  We will repeat this in 1 year.

## 2023-12-24 NOTE — Assessment & Plan Note (Signed)
 History of peripheral arterial disease status post right common iliac artery stenting by myself 8/16/1 with an 8 mm x 2 cm balloon expandable stent.  He did have a occluded SFAs bilaterally.  I repeat angiogram him 03/16/2014 I was able to percutaneously recanalize his right common iliac artery CTO and placed a 7 mm x 30 mm long iCAST covered stent.  I did bring him back for angiography 11/30/2016 and tried to cross his right SFA CTO unsuccessfully because of inability to cross the proximal.  He currently denies claudication.  His Dopplers do show a decrease in his right ABI from 0.62-0.45 and of his left ABI from 0.67-0.54.  His Dopplers do show monophasic waveforms bilaterally with velocities in the right common iliac artery of 281.

## 2023-12-25 ENCOUNTER — Ambulatory Visit: Payer: Self-pay | Admitting: Cardiovascular Disease

## 2023-12-25 ENCOUNTER — Other Ambulatory Visit: Payer: Self-pay | Admitting: Internal Medicine

## 2023-12-25 LAB — LIPID PANEL
Chol/HDL Ratio: 1.9 ratio (ref 0.0–5.0)
Cholesterol, Total: 108 mg/dL (ref 100–199)
HDL: 58 mg/dL (ref >39)
LDL Chol Calc (NIH): 33 mg/dL (ref 0–99)
Triglycerides: 85 mg/dL (ref 0–149)
VLDL Cholesterol Cal: 17 mg/dL (ref 5–40)

## 2023-12-25 LAB — HEPATIC FUNCTION PANEL
ALT: 23 IU/L (ref 0–44)
AST: 18 IU/L (ref 0–40)
Albumin: 4.4 g/dL (ref 3.8–4.8)
Alkaline Phosphatase: 74 IU/L (ref 44–121)
Bilirubin Total: 0.4 mg/dL (ref 0.0–1.2)
Bilirubin, Direct: 0.19 mg/dL (ref 0.00–0.40)
Total Protein: 6.6 g/dL (ref 6.0–8.5)

## 2024-01-13 ENCOUNTER — Other Ambulatory Visit: Payer: Self-pay | Admitting: Family Medicine

## 2024-01-13 DIAGNOSIS — F320 Major depressive disorder, single episode, mild: Secondary | ICD-10-CM

## 2024-01-17 ENCOUNTER — Telehealth: Payer: Self-pay | Admitting: *Deleted

## 2024-01-17 NOTE — Telephone Encounter (Signed)
 Copied from CRM [id number]. Topic: Clinical - Prescription Issue >> Jan 17, 2024 10:09 AM Harlene ORN wrote: Reason for CRM: [facility] Calling to follow up on med refill request for the patient that was sent on 09/14, 09/16, and again 09/18. For Sertraline . Phone: [phone]

## 2024-01-17 NOTE — Telephone Encounter (Signed)
 Spoke with patient, set up office visit, 10/13

## 2024-02-11 ENCOUNTER — Ambulatory Visit: Admitting: Family Medicine

## 2024-02-11 ENCOUNTER — Encounter: Payer: Self-pay | Admitting: Family Medicine

## 2024-02-11 VITALS — BP 116/70 | HR 71 | Temp 97.2°F | Ht 70.0 in | Wt 171.0 lb

## 2024-02-11 DIAGNOSIS — J449 Chronic obstructive pulmonary disease, unspecified: Secondary | ICD-10-CM

## 2024-02-11 DIAGNOSIS — F320 Major depressive disorder, single episode, mild: Secondary | ICD-10-CM | POA: Diagnosis not present

## 2024-02-11 DIAGNOSIS — I739 Peripheral vascular disease, unspecified: Secondary | ICD-10-CM | POA: Diagnosis not present

## 2024-02-11 DIAGNOSIS — Z720 Tobacco use: Secondary | ICD-10-CM | POA: Diagnosis not present

## 2024-02-11 DIAGNOSIS — I10 Essential (primary) hypertension: Secondary | ICD-10-CM | POA: Diagnosis not present

## 2024-02-11 DIAGNOSIS — N401 Enlarged prostate with lower urinary tract symptoms: Secondary | ICD-10-CM | POA: Diagnosis not present

## 2024-02-11 DIAGNOSIS — N3943 Post-void dribbling: Secondary | ICD-10-CM | POA: Diagnosis not present

## 2024-02-11 DIAGNOSIS — I700 Atherosclerosis of aorta: Secondary | ICD-10-CM | POA: Diagnosis not present

## 2024-02-11 DIAGNOSIS — Z125 Encounter for screening for malignant neoplasm of prostate: Secondary | ICD-10-CM

## 2024-02-11 DIAGNOSIS — I2510 Atherosclerotic heart disease of native coronary artery without angina pectoris: Secondary | ICD-10-CM

## 2024-02-11 LAB — PSA, MEDICARE: PSA: 0.77 ng/mL (ref 0.10–4.00)

## 2024-02-11 MED ORDER — SERTRALINE HCL 50 MG PO TABS: ORAL_TABLET | ORAL | 1 refills | Status: AC

## 2024-02-11 MED ORDER — NICOTINE POLACRILEX 2 MG MT GUM: 2.0000 mg | CHEWING_GUM | OROMUCOSAL | 0 refills | Status: AC | PRN

## 2024-02-11 NOTE — Progress Notes (Signed)
 Established Patient Office Visit   Subjective  Patient ID: Brady Hora., male    DOB: 10-08-1950  Age: 73 y.o. MRN: [id number]  Chief Complaint  Patient presents with   Medical Management of Chronic Issues    Pt is a 73 yo male seen for f/u on chronic conditions.  He experiences urinary frequency and difficulty fully emptying his bladder. His prostate is scheduled to be checked, and his last PSA test a year ago was normal.  Having drainage from his head when sitting up, for which he uses a nasal spray.  On Trelegy for COPD.  Denies significant coughing or wheezing, though he experiences some wheezing upon deep breathing. He is scheduled to see his pulmonologist soon as he is retiring.  Smoking less than a pack a day.  Tried nicotine  patches in the past, which caused skin irritation.  His mood is fine, sleep is good, and appetite is pretty good. Doing well on Zoloft  50 mg.    Had recent f/u with Cardiology.  Denies chest pain.  Taking spironolactone , olmesartan , diltiazem , Imdur , Plavix , and Zetia , are being taken as prescribed.     Patient Active Problem List   Diagnosis Date Noted   Atherosclerosis of aorta 05/03/2022   Depression, major, single episode, mild 05/03/2022   Carotid artery disease 12/31/2020   Paroxysmal atrial fibrillation (HCC) 12/31/2020   COPD with acute exacerbation (HCC) 10/31/2020   PVD (peripheral vascular disease) 11/30/2016   Claudication in peripheral vascular disease 10/30/2016   Essential hypertension 08/29/2016   COPD mixed type (HCC) 05/12/2014   Cerumen impaction 05/12/2014   Seasonal and perennial allergic rhinitis 05/12/2014   Leukocytosis 03/17/2014   Lung nodule seen on imaging study 03/17/2014   Claudication (HCC) 03/16/2014   Peripheral arterial disease 01/22/2014   Coronary artery disease 01/22/2014   Hyperlipidemia 01/22/2014   Tobacco abuse 01/22/2014   Past Medical History:  Diagnosis Date   COPD (chronic obstructive  pulmonary disease) (HCC)    Coronary artery disease    a. s/p RCA stenting back in 2005 with a known chronically occluded circumflex and normal LV function.   Emphysema lung (HCC)    GERD (gastroesophageal reflux disease)    Heart attack (HCC) <2005   History of tobacco abuse    Hyperlipidemia    Hypertension    Lung nodule    a. Suspicious for malignancy, undergoing workup.   Obesity    Peripheral arterial disease    a. history of stent to right common iliac artery 12/15/99 with a peak for balloon-expandable stent. b. s/p intervention on RCIA total occlusion PTA and stent, residual disease on the right for possible staged intervention, notable disease on the left but asymptomatic in 2015. c. 10/2016: s/p PTA and stenting of left common iliac   Past Surgical History:  Procedure Laterality Date   CARDIAC CATHETERIZATION  10/26/1999   thelbert 09/13/2010   Cardiolite study     59% ejection fraction and negative for ischemia   CORONARY ANGIOPLASTY WITH STENT PLACEMENT  2005   Taxus stent placed to his RCA    HERNIA REPAIR     ILIAC ARTERY STENT Right 12/15/1999   a. history of stent to right common iliac artery 12/15/99 with a peak for balloon-expandable stent. b. s/p intervention on RCIA total occlusion PTA and stent, residual disease on the right for possible staged intervention, notable disease on the left but asymptomatic.   ILIAC VEIN ANGIOPLASTY / STENTING Right 03/16/2014   rt common  iliac    by dr berry   LAPAROSCOPIC CHOLECYSTECTOMY  1990s   LOWER EXTREMITY ANGIOGRAM N/A 03/16/2014   Procedure: LOWER EXTREMITY ANGIOGRAM;  Surgeon: Dorn JINNY Lesches, MD;  Location: [facility] CATH LAB;  Service: Cardiovascular;  Laterality: N/A;   LOWER EXTREMITY ANGIOGRAPHY N/A 10/30/2016   Procedure: Lower Extremity Angiography;  Surgeon: Lesches Dorn JINNY, MD;  Location: [facility] INVASIVE CV LAB;  Service: Cardiovascular;  Laterality: N/A;   LOWER EXTREMITY ANGIOGRAPHY N/A 11/30/2016   Procedure: Lower Extremity  Angiography;  Surgeon: Lesches Dorn JINNY, MD;  Location: [facility] INVASIVE CV LAB;  Service: Cardiovascular;  Laterality: N/A;   PERIPHERAL VASCULAR INTERVENTION Left 10/30/2016   Procedure: Peripheral Vascular Intervention;  Surgeon: Lesches Dorn JINNY, MD;  Location: [facility] INVASIVE CV LAB;  Service: Cardiovascular;  Laterality: Left;  Lt Com ILIAC   TONSILLECTOMY AND ADENOIDECTOMY  1950s   UMBILICAL HERNIA REPAIR  2012   Social History   Tobacco Use   Smoking status: Every Day    Current packs/day: 1.00    Average packs/day: 1 pack/day for 57.0 years (57.0 ttl pk-yrs)    Types: Cigarettes   Smokeless tobacco: Never   Tobacco comments:    currently smoking 1ppd as of 4/8  Vaping Use   Vaping status: Never Used  Substance Use Topics   Alcohol use: Yes    Comment: 10/30/2016 might have a few drinks 4-5 times/year   Drug use: No   Family History  Problem Relation Age of Onset   Heart disease Father    Asthma Son    Lung cancer Paternal Grandfather    CAD Brother    CAD Brother    Allergies  Allergen Reactions   Amlodipine  Cough   Aspirin  Other (See Comments)    Sneezes, watery nose, wheeze (no polyps)    Contrast Media [Iodinated Contrast Media]     Need to be pre-medicated, sneezing, watery eyes   Eliquis  [Apixaban ] Nausea And Vomiting   Erythromycin Hives    Childhood allergy All mycin drugs    Losartan  Potassium Cough   Penicillins     Childhood allergy Has patient had a PCN reaction causing immediate rash, facial/tongue/throat swelling, SOB or lightheadedness with hypotension: Unknown Has patient had a PCN reaction causing severe rash involving mucus membranes or skin necrosis: Unknown Has patient had a PCN reaction that required hospitalization: No Has patient had a PCN reaction occurring within the last 10 years: No States he has had benadryl  when taking this and had no problems recently     Simvastatin  Other (See Comments)    Hip pain   Sulfa Antibiotics     unknown     ROS Negative unless stated above    Objective:     BP 116/70   Pulse 71   Temp (!) 97.2 F (36.2 C)   Ht 5' 10 (1.778 m)   Wt 171 lb (77.6 kg)   SpO2 91%   BMI 24.54 kg/m  BP Readings from Last 3 Encounters:  02/11/24 116/70  12/24/23 123/70  03/15/23 124/64   Wt Readings from Last 3 Encounters:  02/11/24 171 lb (77.6 kg)  12/24/23 173 lb 9.6 oz (78.7 kg)  03/15/23 176 lb (79.8 kg)      Physical Exam Constitutional:      General: He is not in acute distress.    Appearance: Normal appearance.  HENT:     Head: Normocephalic and atraumatic.     Nose: Nose normal.     Mouth/Throat:  Mouth: Mucous membranes are moist.  Cardiovascular:     Rate and Rhythm: Normal rate and regular rhythm.     Heart sounds: Normal heart sounds. No murmur heard.    No gallop.  Pulmonary:     Effort: Pulmonary effort is normal. No respiratory distress.     Breath sounds: Wheezing present. No rhonchi or rales.  Skin:    General: Skin is warm and dry.  Neurological:     Mental Status: He is alert and oriented to person, place, and time.  Psychiatric:        Behavior: Behavior is cooperative.        11/08/2022    8:15 AM 05/03/2022    9:44 AM 10/28/2021    8:58 AM  Depression screen PHQ 2/9  Decreased Interest 0 0 0  Down, Depressed, Hopeless 0 0 0  PHQ - 2 Score 0 0 0  Altered sleeping  0   Tired, decreased energy  0   Change in appetite  0   Feeling bad or failure about yourself   0   Trouble concentrating  0   Moving slowly or fidgety/restless  0   Suicidal thoughts  0   PHQ-9 Score  0   Difficult doing work/chores  Not difficult at all       05/03/2022   10:16 AM 12/17/2020    3:35 PM  GAD 7 : Generalized Anxiety Score  Nervous, Anxious, on Edge 0 1  Control/stop worrying 1 0  Worry too much - different things 0 0  Trouble relaxing 0 0  Restless 0 0  Easily annoyed or irritable 1 1  Afraid - awful might happen 0 0  Total GAD 7 Score 2 2  Anxiety  Difficulty Not difficult at all Not difficult at all     Assessment & Plan:   Benign prostatic hyperplasia with post-void dribbling -     PSA, Medicare  Tobacco abuse -     Nicotine  Polacrilex; Take 1 each (2 mg total) by mouth as needed for smoking cessation.  Dispense: 100 tablet; Refill: 0  COPD mixed type (HCC)  Prostate cancer screening -     Nicotine  Polacrilex; Take 1 each (2 mg total) by mouth as needed for smoking cessation.  Dispense: 100 tablet; Refill: 0 -     PSA, Medicare  Depression, major, single episode, mild -     Sertraline  HCl; TAKE 1 TABLET(50 MG) BY MOUTH DAILY  Dispense: 90 tablet; Refill: 1  Essential hypertension  Peripheral arterial disease  Aortic atherosclerosis  Coronary artery disease involving native coronary artery of native heart without angina pectoris  Concern for increased BPH symptoms.  Has upcoming appointment with urology.  Previously on Flomax.  Last PSA point 09/28/2022 normal.  Obtain PSA.    Smoking cessation encouraged.  Currently smoking less than 1/2 pack/day.  In the past nicotine  patches caused skin irritation.  Rx for Nicorette gum sent to pharmacy.  Discussed proper use.  Continue yearly low-dose CT screening, due December.  PHQ 9 and GAD 7 given this visit.  Zoloft  50 mg refilled.  Continue self-care.  Consider counseling.  COPD stable.  Continue Trelegy and albuterol  as needed.  Consider Allegra for increased nasal drainage/seasonal allergy symptoms.  Monitor topO2 as initially 92% in clinic.  Continue follow-up with pulmonology, Dr. Neysa.  BP controlled.  Continue current medications.  Continue lifestyle modifications.  Continue Zetia  and Crestor  for cholesterol control.  Continue follow-up with cardiology.  Return in  about 3 months (around 05/13/2024) for chronic conditions.   Clotilda JONELLE Single, MD

## 2024-02-12 ENCOUNTER — Other Ambulatory Visit: Payer: Self-pay | Admitting: Cardiovascular Disease

## 2024-02-12 ENCOUNTER — Ambulatory Visit: Payer: Self-pay | Admitting: Family Medicine

## 2024-02-19 DIAGNOSIS — Z125 Encounter for screening for malignant neoplasm of prostate: Secondary | ICD-10-CM | POA: Diagnosis not present

## 2024-02-25 ENCOUNTER — Other Ambulatory Visit: Payer: Self-pay | Admitting: Internal Medicine

## 2024-03-21 ENCOUNTER — Other Ambulatory Visit: Payer: Self-pay | Admitting: Internal Medicine

## 2024-04-02 ENCOUNTER — Other Ambulatory Visit: Payer: Self-pay | Admitting: Internal Medicine

## 2024-04-04 ENCOUNTER — Other Ambulatory Visit: Payer: Self-pay | Admitting: Internal Medicine

## 2024-04-04 NOTE — Telephone Encounter (Signed)
 Copied from CRM [id number]. Topic: Clinical - Medication Refill >> Apr 04, 2024 11:52 AM Isabell A wrote: Medication: albuterol  (VENTOLIN  HFA) 108 (90 Base) MCG/ACT inhaler [[id number]]  Has the patient contacted their pharmacy? Yes (Agent: If no, request that the patient contact the pharmacy for the refill. If patient does not wish to contact the pharmacy document the reason why and proceed with request.) (Agent: If yes, when and what did the pharmacy advise?)  This is the patient's preferred pharmacy:  [id number] - [place], [place] - [phone] [place] [address] Phone: [phone] Fax: [phone]  Is this the correct pharmacy for this prescription? Yes If no, delete pharmacy and type the correct one.   Has the prescription been filled recently? Yes  Is the patient out of the medication? Yes  Has the patient been seen for an appointment in the last year OR does the patient have an upcoming appointment? Yes  Can we respond through MyChart? No  Agent: Please be advised that Rx refills may take up to 3 business days. We ask that you follow-up with your pharmacy.

## 2024-04-07 ENCOUNTER — Other Ambulatory Visit: Payer: Self-pay | Admitting: Cardiovascular Disease

## 2024-04-10 ENCOUNTER — Other Ambulatory Visit: Payer: Self-pay

## 2024-04-10 MED ORDER — ALBUTEROL SULFATE HFA 108 (90 BASE) MCG/ACT IN AERS: 2.0000 | INHALATION_SPRAY | Freq: Four times a day (QID) | RESPIRATORY_TRACT | 0 refills | Status: DC | PRN

## 2024-04-10 NOTE — Telephone Encounter (Signed)
 Copied from CRM [id number]. Topic: Clinical - Medication Refill >> Apr 04, 2024 11:52 AM Brady Brooks wrote: Medication: albuterol  (VENTOLIN  HFA) 108 (90 Base) MCG/ACT inhaler [[id number]]  Has the patient contacted their pharmacy? Yes (Agent: If no, request that the patient contact the pharmacy for the refill. If patient does not wish to contact the pharmacy document the reason why and proceed with request.) (Agent: If yes, when and what did the pharmacy advise?)  This is the patient's preferred pharmacy:  [id number] - [place], [place] - [id number] [place] [address] Phone: [phone] Fax: [phone]  Is this the correct pharmacy for this prescription? Yes If no, delete pharmacy and type the correct one.   Has the prescription been filled recently? Yes  Is the patient out of the medication? Yes  Has the patient been seen for an appointment in the last year OR does the patient have an upcoming appointment? Yes  Can we respond through MyChart? No  Agent: Please be advised that Rx refills may take up to 3 business days. We ask that you follow-up with your pharmacy. >> Apr 08, 2024  1:07 PM Brady Brooks wrote: Patient now has an appt scheduled, requesting refill now if possible. Please all to advise.    Refilled albuterol . Called and informed pt and advised that pt must keep 05/02/24 appt with Sarah Groce for any further refills. Pt verbalized understanding, NFN.

## 2024-04-18 ENCOUNTER — Other Ambulatory Visit

## 2024-04-18 DIAGNOSIS — Z122 Encounter for screening for malignant neoplasm of respiratory organs: Secondary | ICD-10-CM

## 2024-04-18 DIAGNOSIS — F17210 Nicotine dependence, cigarettes, uncomplicated: Secondary | ICD-10-CM

## 2024-04-18 DIAGNOSIS — Z87891 Personal history of nicotine dependence: Secondary | ICD-10-CM

## 2024-04-28 ENCOUNTER — Other Ambulatory Visit: Payer: Self-pay

## 2024-04-28 DIAGNOSIS — Z122 Encounter for screening for malignant neoplasm of respiratory organs: Secondary | ICD-10-CM

## 2024-04-28 DIAGNOSIS — Z87891 Personal history of nicotine dependence: Secondary | ICD-10-CM

## 2024-04-28 DIAGNOSIS — F17210 Nicotine dependence, cigarettes, uncomplicated: Secondary | ICD-10-CM

## 2024-05-02 ENCOUNTER — Encounter: Payer: Self-pay | Admitting: Acute Care

## 2024-05-02 ENCOUNTER — Other Ambulatory Visit: Payer: Self-pay | Admitting: Internal Medicine

## 2024-05-02 ENCOUNTER — Ambulatory Visit: Admitting: Acute Care

## 2024-05-02 VITALS — BP 118/69 | HR 63 | Temp 97.2°F | Ht 70.0 in | Wt 163.0 lb

## 2024-05-02 DIAGNOSIS — R0902 Hypoxemia: Secondary | ICD-10-CM

## 2024-05-02 DIAGNOSIS — R630 Anorexia: Secondary | ICD-10-CM

## 2024-05-02 DIAGNOSIS — F17210 Nicotine dependence, cigarettes, uncomplicated: Secondary | ICD-10-CM

## 2024-05-02 DIAGNOSIS — J310 Chronic rhinitis: Secondary | ICD-10-CM

## 2024-05-02 DIAGNOSIS — F17200 Nicotine dependence, unspecified, uncomplicated: Secondary | ICD-10-CM

## 2024-05-02 DIAGNOSIS — R634 Abnormal weight loss: Secondary | ICD-10-CM

## 2024-05-02 DIAGNOSIS — R911 Solitary pulmonary nodule: Secondary | ICD-10-CM

## 2024-05-02 DIAGNOSIS — J449 Chronic obstructive pulmonary disease, unspecified: Secondary | ICD-10-CM | POA: Diagnosis not present

## 2024-05-02 MED ORDER — TRELEGY ELLIPTA 200-62.5-25 MCG/ACT IN AEPB: 1.0000 | INHALATION_SPRAY | Freq: Every day | RESPIRATORY_TRACT | 11 refills | Status: AC

## 2024-05-02 MED ORDER — ALBUTEROL SULFATE HFA 108 (90 BASE) MCG/ACT IN AERS: 2.0000 | INHALATION_SPRAY | Freq: Four times a day (QID) | RESPIRATORY_TRACT | 6 refills | Status: AC | PRN

## 2024-05-02 MED ORDER — TRELEGY ELLIPTA 200-62.5-25 MCG/ACT IN AEPB: 1.0000 | INHALATION_SPRAY | Freq: Every day | RESPIRATORY_TRACT | Status: AC

## 2024-05-02 NOTE — Progress Notes (Signed)
 "  History of Present Illness Brady Brooks. is a 74 y.o. male current everyday smoker followed through the lung cancer screening program.  Patient referred for follow-up of abnormal chest imaging 01/17/2023.   05/02/2024 Discussed the use of AI scribe software for clinical note transcription with the patient, who gave verbal consent to proceed.  History of Present Illness  Brady Brooks. is a 74 year old male current everyday smoker with COPD , and abnormal chest imaging,  who presents for follow-up low-dose CT screening CT review and evaluation of  unintentional weight loss and decreased appetite.  Over the past three months, he has experienced unintentional weight loss, decreasing from 171 pounds in October to 163 pounds currently. He attributes this to a lack of appetite, stating, 'I don't have the appetite I used to have.' He denies intentional weight loss and is unsure of the exact amount lost. His wife cooks for him, but he often does not feel like eating.  He reports increased difficulty breathing, which he feels may be contributing to his weight loss. He does not use supplemental oxygen. He uses Trelegy daily and albuterol  approximately four times a day. He has a nebulizer, DuoNebs, but does not use it frequently as he does not find it helpful. Trelegy helps significantly with his symptoms.  He is compliant with his Trelegy.  He experiences nasal congestion, which is worse during the day but not present when lying down at night. He uses Flonase  and has been taking Allegra for his symptoms. He is considering trying a different antihistamine to see if it helps with his congestion.  We discussed adding nasal irrigation, and continuing his Flonase  in addition to changing his nonsedating antihistamine.  Patient is a smoker. His oxygen saturations today were noted to be 97%.  Breath sounds are distant.  Plan will be for an annual low-dose CT due in December 2026.  We discussed that  patient will call to be seen sooner if he develops any further unexplained weight loss, hemoptysis, or worsening cough and shortness of breath. We also discussed supplements like boost and Ensure, frequent small meals to see if we can help him gain some weight.     Test Results: Low-dose CT chest 04/18/2024 Centrilobular and paraseptal emphysema. Scattered volume loss in the lung bases. Tiny juxtapleural nodules are considered benign. No new pulmonary nodules. No pleural fluid. Debris in the airway. Lung-RADS 2, benign appearance or behavior. Continue annual screening with low-dose chest CT without contrast in 12 months. 2. Aortic atherosclerosis (ICD10-I70.0). Coronary artery calcification. 3.  Emphysema (ICD10-J43.9).    LDCT 04/2023 Lungs/Pleura: Severe changes of emphysema. No pleural fluid, airspace consolidation, or pneumothorax. Chronic area of scarring and volume loss within the anterior basal right middle lobe is again noted which appears similar to CT of the chest from 10/22/2019 and is most likely the sequelae of chronic scarring. Similarly, there is a stable area of subpleural atelectasis within the inferior lingula along the left heart border which is also unchanged from 2021.There is a stable subpleural nodule within the posterior left upper lobe along the oblique fissure with a mean derived diameter of 3.8 mm no new or suspicious lung nodules.  Lung-RADS 2, benign appearance or behavior. Continue annual screening with low-dose chest CT without contrast in 12 months. 2. Coronary artery calcifications. 3. Aortic Atherosclerosis (ICD10-I70.0) and Emphysema (ICD10-J43.9).    LDCT 12/2022 Nodular areas of architectural distortion in the right middle lobe and inferior segment of the  lingula categorized as Lung-RADS 4AS, suspicious, but favored to represent areas of evolving post infectious or inflammatory scarring. Follow up low-dose chest CT without contrast in 3 months     Latest Ref Rng & Units 10/07/2021    8:31 AM 11/09/2020    6:02 AM 11/08/2020    7:48 AM  CBC  WBC 3.4 - 10.8 x10E3/uL 8.5  11.6  11.2   Hemoglobin 13.0 - 17.7 g/dL 84.2  84.0  83.5   Hematocrit 37.5 - 51.0 % 46.5  44.9  46.8   Platelets 150 - 450 x10E3/uL 229  108  121        Latest Ref Rng & Units 10/07/2021    8:31 AM 02/17/2021    2:42 PM 11/09/2020    6:02 AM  BMP  Glucose 70 - 99 mg/dL 834  868  875   BUN 8 - 27 mg/dL 14  12  13    Creatinine 0.76 - 1.27 mg/dL 9.13  9.15  9.29   BUN/Creat Ratio 10 - 24 16     Sodium 134 - 144 mmol/L 140  139  133   Potassium 3.5 - 5.2 mmol/L 4.6  4.2  4.1   Chloride 96 - 106 mmol/L 101  104  101   CO2 20 - 29 mmol/L 23  25  27    Calcium  8.6 - 10.2 mg/dL 9.1  8.9  7.7     BNP No results found for: BNP  ProBNP No results found for: PROBNP  PFT    Component Value Date/Time   FEV1PRE 1.83 12/02/2014 0842   FEV1POST 2.01 12/02/2014 0842   FVCPRE 3.25 12/02/2014 0842   FVCPOST 3.43 12/02/2014 0842   DLCOUNC 20.57 12/02/2014 0842   PREFEV1FVCRT 56 12/02/2014 0842   PSTFEV1FVCRT 59 12/02/2014 0842    CT CHEST LUNG CA SCREEN LOW DOSE W/O CM Result Date: 04/28/2024 CLINICAL DATA:  Current 62 pack-year smoker. EXAM: CT CHEST WITHOUT CONTRAST LOW-DOSE FOR LUNG CANCER SCREENING TECHNIQUE: Multidetector CT imaging of the chest was performed following the standard protocol without IV contrast. RADIATION DOSE REDUCTION: This exam was performed according to the departmental dose-optimization program which includes automated exposure control, adjustment of the mA and/or kV according to patient size and/or use of iterative reconstruction technique. COMPARISON:  04/18/2023. FINDINGS: Cardiovascular: Atherosclerotic calcification of the aorta, aortic valve and coronary arteries. Heart size normal. No pericardial effusion. Mediastinum/Nodes: No pathologically enlarged mediastinal or axillary lymph nodes. Hilar regions are difficult to definitively  evaluate without IV contrast. Air in the esophagus can be seen with dysmotility. Lungs/Pleura: Centrilobular and paraseptal emphysema. Scattered volume loss in the lung bases. Tiny juxtapleural nodules are considered benign. No new pulmonary nodules. No pleural fluid. Debris in the airway. Upper Abdomen: Cholecystectomy. Low-attenuation lesion off the right kidney. No specific follow-up necessary. Visualized portions of the liver, adrenal glands, kidneys, spleen, pancreas, stomach and bowel are otherwise grossly unremarkable. No upper abdominal adenopathy. Musculoskeletal: Degenerative changes in the spine. IMPRESSION: 1. Lung-RADS 2, benign appearance or behavior. Continue annual screening with low-dose chest CT without contrast in 12 months. 2. Aortic atherosclerosis (ICD10-I70.0). Coronary artery calcification. 3.  Emphysema (ICD10-J43.9). Electronically Signed   By: Newell Eke M.D.   On: 04/28/2024 12:16     Past medical hx Past Medical History:  Diagnosis Date   COPD (chronic obstructive pulmonary disease) (HCC)    Coronary artery disease    a. s/p RCA stenting back in 2005 with a known chronically occluded circumflex and normal LV  function.   Emphysema lung (HCC)    GERD (gastroesophageal reflux disease)    Heart attack (HCC) <2005   History of tobacco abuse    Hyperlipidemia    Hypertension    Lung nodule    a. Suspicious for malignancy, undergoing workup.   Obesity    Peripheral arterial disease    a. history of stent to right common iliac artery 12/15/99 with a peak for balloon-expandable stent. b. s/p intervention on RCIA total occlusion PTA and stent, residual disease on the right for possible staged intervention, notable disease on the left but asymptomatic in 2015. c. 10/2016: s/p PTA and stenting of left common iliac     Social History[1]  Mr.Pratt reports that he has been smoking cigarettes. He has a 57 pack-year smoking history. He has never used smokeless tobacco. He  reports current alcohol use. He reports that he does not use drugs.  Tobacco Cessation: Ready to quit: Not Answered Counseling given: Not Answered Tobacco comments: currently smoking less than1ppd. 05/02/2024 Current everyday smoker currently smoking less than 1 pack/day with a 57-pack-year smoking history  Past surgical hx, Family hx, Social hx all reviewed.  Current Outpatient Medications on File Prior to Visit  Medication Sig   clopidogrel  (PLAVIX ) 75 MG tablet TAKE 1 TABLET BY MOUTH DAILY. OVERDUE APPOINTMENT- MUST SCHEDULE APPOINTMENT FOR ADDITIONAL FILLS.   Coenzyme Q10 (COQ10) 100 MG CAPS Take 100 mg by mouth daily.   diltiazem  (CARDIZEM  CD) 120 MG 24 hr capsule Take 1 capsule (120 mg total) by mouth daily.   ezetimibe  (ZETIA ) 10 MG tablet TAKE 1 TABLET(10 MG) BY MOUTH DAILY. OVERDUE APPOINTMENT- MUST SCHEDULE APPOINTMENT FOR ADDITIONAL FILLS.   fexofenadine (ALLEGRA) 180 MG tablet Take 180 mg by mouth daily.    fluticasone  (FLONASE ) 50 MCG/ACT nasal spray INHALE 1 SPRAY INTO EACH NOSTRIL EVERY DAY   Fluticasone -Umeclidin-Vilant (TRELEGY ELLIPTA ) 200-62.5-25 MCG/ACT AEPB INHALE 1 PUFF INTO THE LUNGS DAILY   ipratropium-albuterol  (DUONEB) 0.5-2.5 (3) MG/3ML SOLN Take 3 mLs by nebulization every 6 (six) hours as needed.   isosorbide  mononitrate (IMDUR ) 30 MG 24 hr tablet TAKE 1 TABLET(30 MG) BY MOUTH DAILY. MAKE AN APPOINTMENT IN ORDER TO RECEIVE FUTURE REFILLS.2 ND ATTEMPT   olmesartan  (BENICAR ) 20 MG tablet Take 1 tablet (20 mg total) by mouth daily.   rosuvastatin  (CRESTOR ) 20 MG tablet TAKE 1 TABLET(20 MG) BY MOUTH DAILY   sertraline  (ZOLOFT ) 50 MG tablet TAKE 1 TABLET(50 MG) BY MOUTH DAILY   spironolactone  (ALDACTONE ) 25 MG tablet Take 1 tablet (25 mg total) by mouth daily.   tamsulosin (FLOMAX) 0.4 MG CAPS capsule Take by mouth daily at 6 (six) AM.   triamcinolone (NASACORT) 55 MCG/ACT AERO nasal inhaler Place 2 sprays into the nose daily as needed (allergies).   nicotine   polacrilex (NICORETTE  STARTER KIT) 2 MG gum Take 1 each (2 mg total) by mouth as needed for smoking cessation. (Patient not taking: Reported on 05/02/2024)   No current facility-administered medications on file prior to visit.     Allergies[2]  Review Of Systems:  Constitutional:   +  weight loss, no night sweats,  Fevers, chills, +fatigue, or  lassitude.  HEENT:   No headaches,  Difficulty swallowing,  Tooth/dental problems, or  Sore throat,                No sneezing, itching, ear ache, + nasal congestion, post nasal drip,   CV:  No chest pain,  Orthopnea, PND, swelling in lower extremities, anasarca, dizziness,  palpitations, syncope.   GI  No heartburn, indigestion, abdominal pain, nausea, vomiting, diarrhea, change in bowel habits, loss of appetite, bloody stools.   Resp: + shortness of breath with exertion less at rest.  No excess mucus, no productive cough,  No non-productive cough,  No coughing up of blood.  No change in color of mucus.  No wheezing.  No chest wall deformity  Skin: no rash or lesions.  GU: no dysuria, change in color of urine, no urgency or frequency.  No flank pain, no hematuria   MS:  No joint pain or swelling.  + decreased range of motion.  No back pain.  Psych:  No change in mood or affect. No depression or anxiety.  No memory loss.   Vital Signs BP 118/69   Temp (!) 97.2 F (36.2 C) (Oral)   Ht 5' 10 (1.778 m)   Wt 163 lb (73.9 kg)   BMI 23.39 kg/m    Physical Exam: Physical Exam VITALS: SaO2- 97% MEASUREMENTS: Weight- 163. GENERAL: No distress, alert and oriented times 3. EARS NOSE THROAT: No sinus tenderness, tympanic membranes clear, pale nasal mucosa, no oral exudate, no post nasal drip, no lymphadenopathy. CHEST: No wheeze, rales, dullness, no accessory muscle use, no nasal flaring, no sternal retractions. CARDIAC: S1, S2, regular rate and rhythm, no murmur. ABDOMINAL: Soft, non tender. ND, BS present,Body mass index is 23.39 kg/m.   EXTREMITIES: No clubbing, cyanosis, edema. No obvious deformities NEUROLOGICAL: Normal strength. Alert and oriented x 3, MAE x 4 SKIN: No rashes, warm and dry. No obvious skin lesions PSYCHIATRIC: Normal mood and behavior.   Assessment/Plan Assessment & Plan Chronic obstructive pulmonary disease COPD with scattered volume loss in the bases.  No new pulmonary nodules.  Oxygen saturation at 97%.  Increased dyspnea and decreased appetite.  Avoids nebulizer due to side effects of lightheadedness. Plan - Refilled Trelegy and albuterol  prescriptions. - Provided two samples of Trelegy. - Encouraged smoking cessation. - Will assign to a new pulmonary doctor for ongoing management.  Chronic rhinitis Increased nasal congestion.  No congestion when supine. - Switch to Xyzal or another non-sedating antihistamine. - Consider using a neti pot for nasal irrigation.  Unintentional weight loss Lost 7-8 pounds over three months.  Decreased appetite and activity due to dyspnea. Plan - Encouraged increased caloric intake with protein shakes or milk. - Instructed to monitor weight and appetite.  Current every day smoker  57 pack year smoking history Plan - Counseled to quit smoking x 3 minutes today    AVS 05/02/2024 We have reviewed your CT Chest. It was read as a LR 2, meaning no concerning lung nodules.  This is good news.  You will be due for an annual scan 03/2025.  You will get a call to get this scheduled closed to the time.  Please work in quitting smoking completely. I am worried about your weight loss over the last 3 months . Please work on weight gain. Try supplements like Boost or Ensure.  Try frequent , small meals. Continue Trelegy 1 puff daily. I have sent in a prescription for refills.  I will give you afew samples today. Continue using your albuterol  as needed for breakthrough shortness of breath or wheezing.  Consider using your nebulizer treatments if your  shortness of breath gets any worse.  Call if you need us  sooner  Pt. Will need to re-establish with a new pulmonologist as he has been a patient of Dr. Neysa. He will need a  6 month follow up.  Call if you need us  sooner. Please contact office for sooner follow up if symptoms do not improve or worsen or seek emergency care     I spent 20 minutes dedicated to the care of this patient on the date of this encounter to include pre-visit review of records, face-to-face time with the patient discussing conditions above, post visit ordering of testing, clinical documentation with the electronic health record, making appropriate referrals as documented, and communicating necessary information to the patient's healthcare team.   Lauraine JULIANNA Lites, NP 05/02/2024  11:36 AM             [1]  Social History Tobacco Use   Smoking status: Every Day    Current packs/day: 1.00    Average packs/day: 1 pack/day for 57.0 years (57.0 ttl pk-yrs)    Types: Cigarettes   Smokeless tobacco: Never   Tobacco comments:    currently smoking less than1ppd. 05/02/2024  Vaping Use   Vaping status: Never Used  Substance Use Topics   Alcohol use: Yes    Comment: 10/30/2016 might have a few drinks 4-5 times/year   Drug use: No  [2]  Allergies Allergen Reactions   Amlodipine  Cough   Aspirin  Other (See Comments)    Sneezes, watery nose, wheeze (no polyps)    Contrast Media [Iodinated Contrast Media]     Need to be pre-medicated, sneezing, watery eyes   Eliquis  [Apixaban ] Nausea And Vomiting   Erythromycin Hives    Childhood allergy All mycin drugs    Losartan  Potassium Cough   Penicillins     Childhood allergy Has patient had a PCN reaction causing immediate rash, facial/tongue/throat swelling, SOB or lightheadedness with hypotension: Unknown Has patient had a PCN reaction causing severe rash involving mucus membranes or skin necrosis: Unknown Has patient had a PCN reaction that required hospitalization:  No Has patient had a PCN reaction occurring within the last 10 years: No States he has had benadryl  when taking this and had no problems recently     Simvastatin  Other (See Comments)    Hip pain   Sulfa Antibiotics     unknown   "

## 2024-05-02 NOTE — Patient Instructions (Addendum)
 It is good to see you today. We have reviewed your CT Chest. It was read as a LR 2, meaning no concerning lung nodules.  This is good news.  You will be due for an annual scan 03/2025.  You will get a call to get this scheduled closed to the time.  Please work in quitting smoking completely. I am worried about your weight loss over the last 3 months . Please work on weight gain. Try supplements like Boost or Ensure.  Try frequent , small meals. Continue Trelegy 1 puff daily. I have sent in a prescription for refills.  I will give you afew samples today. Continue using your albuterol  as needed for breakthrough shortness of breath or wheezing.  Consider using your nebulizer treatments if your shortness of breath gets any worse.  Call if you need us  sooner  Pt. Will need to re-establish with a new pulmonologist as he has been a patient of Dr. Neysa. He will need a 6 month follow up.  Call if you need us  sooner. Please contact office for sooner follow up if symptoms do not improve or worsen or seek emergency care

## 2024-05-09 ENCOUNTER — Other Ambulatory Visit: Payer: Self-pay | Admitting: Cardiovascular Disease

## 2024-05-14 ENCOUNTER — Ambulatory Visit: Admitting: Family Medicine

## 2024-06-05 ENCOUNTER — Ambulatory Visit: Admitting: Family Medicine

## 2024-07-18 ENCOUNTER — Ambulatory Visit: Admitting: Family Medicine

## 2024-10-28 ENCOUNTER — Encounter: Admitting: Pulmonary Disease
